# Patient Record
Sex: Female | Born: 1935
Health system: Southern US, Community
[De-identification: ages and names within clinical notes are randomized; demographics above are authoritative.]

## PROBLEM LIST (undated history)

## (undated) DIAGNOSIS — I10 Essential (primary) hypertension: Secondary | ICD-10-CM

## (undated) DIAGNOSIS — IMO0001 Reserved for inherently not codable concepts without codable children: Secondary | ICD-10-CM

## (undated) DIAGNOSIS — I639 Cerebral infarction, unspecified: Secondary | ICD-10-CM

## (undated) HISTORY — PX: CATARACT EXTRACTION: SUR2

## (undated) HISTORY — DX: Cerebral infarction, unspecified: I63.9

## (undated) HISTORY — PX: TUBAL LIGATION: SHX77

## (undated) HISTORY — PX: MOUTH SURGERY: SHX715

## (undated) HISTORY — PX: NASAL SEPTUM SURGERY: SHX37

## (undated) HISTORY — PX: LAPAROTOMY: SHX154

## (undated) HISTORY — PX: OOPHORECTOMY: SHX86

## (undated) HISTORY — PX: APPENDECTOMY: SHX54

## (undated) HISTORY — PX: HYSTEROTOMY: SHX1776

---

## 1997-06-25 ENCOUNTER — Emergency Department (HOSPITAL_COMMUNITY): Admission: EM | Admit: 1997-06-25 | Discharge: 1997-06-25 | Payer: Self-pay | Admitting: Emergency Medicine

## 1997-06-26 ENCOUNTER — Inpatient Hospital Stay (HOSPITAL_COMMUNITY): Admission: EM | Admit: 1997-06-26 | Discharge: 1997-06-29 | Payer: Self-pay | Admitting: Orthopedic Surgery

## 1999-07-14 ENCOUNTER — Other Ambulatory Visit: Admission: RE | Admit: 1999-07-14 | Discharge: 1999-07-14 | Payer: Self-pay | Admitting: Obstetrics and Gynecology

## 1999-08-16 ENCOUNTER — Encounter: Admission: RE | Admit: 1999-08-16 | Discharge: 1999-08-16 | Payer: Self-pay | Admitting: Obstetrics and Gynecology

## 1999-08-16 ENCOUNTER — Encounter: Payer: Self-pay | Admitting: Obstetrics and Gynecology

## 2000-07-25 ENCOUNTER — Other Ambulatory Visit: Admission: RE | Admit: 2000-07-25 | Discharge: 2000-07-25 | Payer: Self-pay | Admitting: Obstetrics and Gynecology

## 2000-09-18 ENCOUNTER — Encounter: Admission: RE | Admit: 2000-09-18 | Discharge: 2000-09-18 | Payer: Self-pay | Admitting: Obstetrics and Gynecology

## 2000-09-18 ENCOUNTER — Encounter: Payer: Self-pay | Admitting: Obstetrics and Gynecology

## 2000-12-10 ENCOUNTER — Ambulatory Visit (HOSPITAL_BASED_OUTPATIENT_CLINIC_OR_DEPARTMENT_OTHER): Admission: RE | Admit: 2000-12-10 | Discharge: 2000-12-10 | Payer: Self-pay | Admitting: Podiatry

## 2001-10-28 ENCOUNTER — Encounter: Admission: RE | Admit: 2001-10-28 | Discharge: 2001-10-28 | Payer: Self-pay | Admitting: Obstetrics and Gynecology

## 2001-10-28 ENCOUNTER — Encounter: Payer: Self-pay | Admitting: Obstetrics and Gynecology

## 2003-05-15 ENCOUNTER — Encounter: Admission: RE | Admit: 2003-05-15 | Discharge: 2003-05-15 | Payer: Self-pay | Admitting: Obstetrics and Gynecology

## 2004-06-14 ENCOUNTER — Encounter: Admission: RE | Admit: 2004-06-14 | Discharge: 2004-06-14 | Payer: Self-pay | Admitting: Obstetrics and Gynecology

## 2005-06-20 ENCOUNTER — Encounter: Admission: RE | Admit: 2005-06-20 | Discharge: 2005-06-20 | Payer: Self-pay | Admitting: Obstetrics and Gynecology

## 2006-08-03 ENCOUNTER — Encounter: Admission: RE | Admit: 2006-08-03 | Discharge: 2006-08-03 | Payer: Self-pay | Admitting: Obstetrics and Gynecology

## 2007-08-07 ENCOUNTER — Encounter: Admission: RE | Admit: 2007-08-07 | Discharge: 2007-08-07 | Payer: Self-pay | Admitting: Obstetrics and Gynecology

## 2008-03-13 DIAGNOSIS — M549 Dorsalgia, unspecified: Secondary | ICD-10-CM | POA: Insufficient documentation

## 2008-11-12 ENCOUNTER — Encounter: Admission: RE | Admit: 2008-11-12 | Discharge: 2008-11-12 | Payer: Self-pay | Admitting: Obstetrics and Gynecology

## 2009-11-17 ENCOUNTER — Encounter: Admission: RE | Admit: 2009-11-17 | Discharge: 2009-11-17 | Payer: Self-pay | Admitting: Obstetrics and Gynecology

## 2010-04-02 ENCOUNTER — Encounter: Payer: Self-pay | Admitting: Obstetrics and Gynecology

## 2010-07-29 NOTE — Op Note (Signed)
[place]. [facility]  Patient:    Kathy Calhoun, Kathy Calhoun Visit Number: [id number] MRN: [id number]          Service Type: Attending:  Cordella Register, D.P.M. Dictated by:   Cordella Register, D.P.M. Proc. Date: 12/10/00                             Operative Report  PREOPERATIVE DIAGNOSIS:  Plantar fasciitis of the left heel.  POSTOPERATIVE DIAGNOSIS:  Plantar fasciitis of the left heel.  PROCEDURE:  Endoscopic release, medial fascial band left.  ANESTHESIA:  IV sedation.  HEMOSTASIS:  Ankle tourniquet left.  INDICATIONS FOR SURGERY:  Chronic discomfort, inability to respond to injection therapy, orthotic therapy, anti-inflammatory therapy, mobilization therapy.  FINDINGS AND PROCEDURE:  The patient was brought to the OR and placed in the supine position.  The patient was injected with a total of 3 cc of Xylocaine in the PT and 12 cc of Xylocaine-Marcaine in an infiltration block.  The patients left foot was prepped and draped utilizing standard aseptic technique, and the left foot was exsanguinated utilizing an Esmarch.  The left ankle tourniquet was inflated, and the following procedure was performed:  Endoscopic release, medial fascial band left.  Attention was directed to the medial aspect of the left heel, where a small 1 cm linear incision was made on the medial side of the heel.  The incision was deepened through subcutaneous tissue.  The plantar fascial tendon was noted.  An elevator was then introduced plantar to this so as to create an opening, and a cannula was introduced from a medial to a lateral direction.  A small incision was made in the lateral side to allow for an exit hole.  A 30 degree scope was introduced into the wound and the plantar fascia was visualized from a dorsal direction. A triangular blade was introduced from the lateral side and an approximate one-third of the plantar fascia was excised with visualization of the underlying  muscle being identified.  This was found to be an adequate resection and should give her good relief.  I placed the scope into the other side, did not see any further pathology.  The wound was flushed with copious amounts of sterile Garamycin solution and was removed.  The cannula was removed.  The skin was sutured utilizing 4-0 nylon in simple interrupted fashion.  The surgical sites were infiltrated with 1 cc of dexamethasone, 5 cc of Marcaine.  The patient was taken to the recovery room in satisfactory condition discharged by the department of anesthesia with instructions and medications. Dictated by:   Cordella Register, D.P.M. Attending:  Cordella Register, D.P.M. DD:  12/12/00 TD:  12/12/00 Job: [phone] [id number]

## 2010-10-17 ENCOUNTER — Other Ambulatory Visit: Payer: Self-pay | Admitting: Obstetrics and Gynecology

## 2010-10-17 DIAGNOSIS — Z1231 Encounter for screening mammogram for malignant neoplasm of breast: Secondary | ICD-10-CM

## 2010-11-22 ENCOUNTER — Ambulatory Visit
Admission: RE | Admit: 2010-11-22 | Discharge: 2010-11-22 | Disposition: A | Discharge status: Home / Self Care | Payer: Medicare Other | Source: Ambulatory Visit | Attending: Obstetrics and Gynecology | Admitting: Obstetrics and Gynecology

## 2010-11-22 ENCOUNTER — Ambulatory Visit: Payer: Self-pay

## 2010-11-22 DIAGNOSIS — Z1231 Encounter for screening mammogram for malignant neoplasm of breast: Secondary | ICD-10-CM

## 2011-03-21 DIAGNOSIS — M76899 Other specified enthesopathies of unspecified lower limb, excluding foot: Secondary | ICD-10-CM | POA: Diagnosis not present

## 2011-03-21 DIAGNOSIS — M48061 Spinal stenosis, lumbar region without neurogenic claudication: Secondary | ICD-10-CM | POA: Diagnosis not present

## 2011-04-05 DIAGNOSIS — M810 Age-related osteoporosis without current pathological fracture: Secondary | ICD-10-CM | POA: Diagnosis not present

## 2011-05-08 DIAGNOSIS — M76899 Other specified enthesopathies of unspecified lower limb, excluding foot: Secondary | ICD-10-CM | POA: Diagnosis not present

## 2011-05-08 DIAGNOSIS — M949 Disorder of cartilage, unspecified: Secondary | ICD-10-CM | POA: Diagnosis not present

## 2011-05-08 DIAGNOSIS — IMO0002 Reserved for concepts with insufficient information to code with codable children: Secondary | ICD-10-CM | POA: Diagnosis not present

## 2011-05-08 DIAGNOSIS — M899 Disorder of bone, unspecified: Secondary | ICD-10-CM | POA: Diagnosis not present

## 2011-05-08 DIAGNOSIS — M545 Low back pain: Secondary | ICD-10-CM | POA: Diagnosis not present

## 2011-06-02 DIAGNOSIS — M48061 Spinal stenosis, lumbar region without neurogenic claudication: Secondary | ICD-10-CM | POA: Diagnosis not present

## 2011-06-02 DIAGNOSIS — M810 Age-related osteoporosis without current pathological fracture: Secondary | ICD-10-CM | POA: Diagnosis not present

## 2011-06-02 DIAGNOSIS — M76899 Other specified enthesopathies of unspecified lower limb, excluding foot: Secondary | ICD-10-CM | POA: Diagnosis not present

## 2011-06-23 DIAGNOSIS — Z0000 Encounter for general adult medical examination without abnormal findings: Secondary | ICD-10-CM | POA: Diagnosis not present

## 2011-06-23 DIAGNOSIS — Z1331 Encounter for screening for depression: Secondary | ICD-10-CM | POA: Diagnosis not present

## 2011-06-23 DIAGNOSIS — I10 Essential (primary) hypertension: Secondary | ICD-10-CM | POA: Diagnosis not present

## 2011-06-23 DIAGNOSIS — Z79899 Other long term (current) drug therapy: Secondary | ICD-10-CM | POA: Diagnosis not present

## 2011-06-26 DIAGNOSIS — I10 Essential (primary) hypertension: Secondary | ICD-10-CM | POA: Diagnosis not present

## 2011-07-13 DIAGNOSIS — N760 Acute vaginitis: Secondary | ICD-10-CM | POA: Diagnosis not present

## 2011-07-13 DIAGNOSIS — N9489 Other specified conditions associated with female genital organs and menstrual cycle: Secondary | ICD-10-CM | POA: Diagnosis not present

## 2011-07-18 ENCOUNTER — Encounter (HOSPITAL_COMMUNITY)
Admission: RE | Admit: 2011-07-18 | Discharge: 2011-07-18 | Disposition: A | Discharge status: Home / Self Care | Payer: Medicare Other | Source: Ambulatory Visit | Attending: Orthopedic Surgery | Admitting: Orthopedic Surgery

## 2011-07-18 ENCOUNTER — Encounter (HOSPITAL_COMMUNITY): Payer: Self-pay

## 2011-07-18 DIAGNOSIS — M810 Age-related osteoporosis without current pathological fracture: Secondary | ICD-10-CM | POA: Insufficient documentation

## 2011-07-18 HISTORY — DX: Reserved for inherently not codable concepts without codable children: IMO0001

## 2011-07-18 HISTORY — DX: Essential (primary) hypertension: I10

## 2011-07-18 MED ORDER — ZOLEDRONIC ACID 5 MG/100ML IV SOLN
5.0000 mg | Freq: Once | INTRAVENOUS | Status: AC
  Administered 2011-07-18: 5 mg via INTRAVENOUS
  Filled 2011-07-18: qty 100

## 2011-07-18 MED ORDER — SODIUM CHLORIDE 0.9 % IV SOLN
Freq: Once | INTRAVENOUS | Status: AC
  Administered 2011-07-18: 15:00:00 via INTRAVENOUS

## 2011-07-18 NOTE — Discharge Instructions (Signed)
RECLAST   ZOLEDRONIC ACID (ZOE le dron ik AS id) lowers the amount of calcium loss from bone. It is used to treat Paget's disease and osteoporosis in women. This medicine may be used for other purposes; ask your health care provider or pharmacist if you have questions. What should I tell my health care provider before I take this medicine? They need to know if you have any of these conditions: -aspirin-sensitive asthma -dental disease -kidney disease -low levels of calcium in the blood -past surgery on the parathyroid gland or intestines -an unusual or allergic reaction to zoledronic acid, other medicines, foods, dyes, or preservatives -pregnant or trying to get pregnant -breast-feeding How should I use this medicine? This medicine is for infusion into a vein. It is given by a health care professional in a hospital or clinic setting. Talk to your pediatrician regarding the use of this medicine in children. This medicine is not approved for use in children. Overdosage: If you think you have taken too much of this medicine contact a poison control center or emergency room at once. NOTE: This medicine is only for you. Do not share this medicine with others. What if I miss a dose? It is important not to miss your dose. Call your doctor or health care professional if you are unable to keep an appointment. What may interact with this medicine? -certain antibiotics given by injection -NSAIDs, medicines for pain and inflammation, like ibuprofen or naproxen -some diuretics like bumetanide, furosemide -teriparatide This list may not describe all possible interactions. Give your health care provider a list of all the medicines, herbs, non-prescription drugs, or dietary supplements you use. Also tell them if you smoke, drink alcohol, or use illegal drugs. Some items may interact with your medicine. What should I watch for while using this medicine? Visit your doctor or health care professional for  regular checkups. It may be some time before you see the benefit from this medicine. Do not stop taking your medicine unless your doctor tells you to. Your doctor may order blood tests or other tests to see how you are doing. Women should inform their doctor if they wish to become pregnant or think they might be pregnant. There is a potential for serious side effects to an unborn child. Talk to your health care professional or pharmacist for more information. You should make sure that you get enough calcium and vitamin D while you are taking this medicine. Discuss the foods you eat and the vitamins you take with your health care professional. Some people who take this medicine have severe bone, joint, and/or muscle pain. This medicine may also increase your risk for a broken thigh bone. Tell your doctor right away if you have pain in your upper leg or groin. Tell your doctor if you have any pain that does not go away or that gets worse. What side effects may I notice from receiving this medicine? Side effects that you should report to your doctor or health care professional as soon as possible: -allergic reactions like skin rash, itching or hives, swelling of the face, lips, or tongue -breathing problems -changes in vision -feeling faint or lightheaded, falls -jaw burning, cramping, or pain -muscle cramps, stiffness, or weakness -trouble passing urine or change in the amount of urine Side effects that usually do not require medical attention (report to your doctor or health care professional if they continue or are bothersome): -bone, joint, or muscle pain -fever -irritation at site where injected -loss of  appetite -nausea, vomiting -stomach upset -tired This list may not describe all possible side effects. Call your doctor for medical advice about side effects. You may report side effects to FDA at 1-800-FDA-1088. Where should I keep my medicine? This drug is given in a hospital or clinic and  will not be stored at home. NOTE: This sheet is a summary. It may not cover all possible information. If you have questions about this medicine, talk to your doctor, pharmacist, or health care provider.  2012, [employer] Standard. (08/26/2010 9:08:15 AM)

## 2011-09-26 DIAGNOSIS — M48061 Spinal stenosis, lumbar region without neurogenic claudication: Secondary | ICD-10-CM | POA: Diagnosis not present

## 2011-09-26 DIAGNOSIS — M949 Disorder of cartilage, unspecified: Secondary | ICD-10-CM | POA: Diagnosis not present

## 2011-09-26 DIAGNOSIS — M899 Disorder of bone, unspecified: Secondary | ICD-10-CM | POA: Diagnosis not present

## 2011-09-26 DIAGNOSIS — M76899 Other specified enthesopathies of unspecified lower limb, excluding foot: Secondary | ICD-10-CM | POA: Diagnosis not present

## 2011-10-12 DIAGNOSIS — H04129 Dry eye syndrome of unspecified lacrimal gland: Secondary | ICD-10-CM | POA: Diagnosis not present

## 2011-10-12 DIAGNOSIS — H52209 Unspecified astigmatism, unspecified eye: Secondary | ICD-10-CM | POA: Diagnosis not present

## 2011-10-12 DIAGNOSIS — H2640 Unspecified secondary cataract: Secondary | ICD-10-CM | POA: Diagnosis not present

## 2011-10-12 DIAGNOSIS — H3500 Unspecified background retinopathy: Secondary | ICD-10-CM | POA: Diagnosis not present

## 2011-10-17 DIAGNOSIS — M5030 Other cervical disc degeneration, unspecified cervical region: Secondary | ICD-10-CM | POA: Diagnosis not present

## 2011-10-17 DIAGNOSIS — M719 Bursopathy, unspecified: Secondary | ICD-10-CM | POA: Diagnosis not present

## 2011-10-17 DIAGNOSIS — M67919 Unspecified disorder of synovium and tendon, unspecified shoulder: Secondary | ICD-10-CM | POA: Diagnosis not present

## 2011-10-26 DIAGNOSIS — M5030 Other cervical disc degeneration, unspecified cervical region: Secondary | ICD-10-CM | POA: Diagnosis not present

## 2011-10-26 DIAGNOSIS — M19019 Primary osteoarthritis, unspecified shoulder: Secondary | ICD-10-CM | POA: Diagnosis not present

## 2011-10-31 DIAGNOSIS — M5412 Radiculopathy, cervical region: Secondary | ICD-10-CM | POA: Diagnosis not present

## 2011-10-31 DIAGNOSIS — M542 Cervicalgia: Secondary | ICD-10-CM | POA: Diagnosis not present

## 2011-11-01 DIAGNOSIS — IMO0002 Reserved for concepts with insufficient information to code with codable children: Secondary | ICD-10-CM | POA: Diagnosis not present

## 2011-11-01 DIAGNOSIS — M5412 Radiculopathy, cervical region: Secondary | ICD-10-CM | POA: Diagnosis not present

## 2011-11-14 ENCOUNTER — Other Ambulatory Visit: Payer: Self-pay | Admitting: Obstetrics and Gynecology

## 2011-11-14 DIAGNOSIS — M47812 Spondylosis without myelopathy or radiculopathy, cervical region: Secondary | ICD-10-CM | POA: Diagnosis not present

## 2011-11-14 DIAGNOSIS — Z1231 Encounter for screening mammogram for malignant neoplasm of breast: Secondary | ICD-10-CM

## 2011-11-28 ENCOUNTER — Ambulatory Visit
Admission: RE | Admit: 2011-11-28 | Discharge: 2011-11-28 | Disposition: A | Discharge status: Home / Self Care | Payer: Medicare Other | Source: Ambulatory Visit | Attending: Obstetrics and Gynecology | Admitting: Obstetrics and Gynecology

## 2011-11-28 DIAGNOSIS — Z1231 Encounter for screening mammogram for malignant neoplasm of breast: Secondary | ICD-10-CM | POA: Diagnosis not present

## 2012-01-16 DIAGNOSIS — Z23 Encounter for immunization: Secondary | ICD-10-CM | POA: Diagnosis not present

## 2012-01-30 DIAGNOSIS — H26499 Other secondary cataract, unspecified eye: Secondary | ICD-10-CM | POA: Diagnosis not present

## 2012-01-30 DIAGNOSIS — H2640 Unspecified secondary cataract: Secondary | ICD-10-CM | POA: Diagnosis not present

## 2012-02-14 DIAGNOSIS — Z01419 Encounter for gynecological examination (general) (routine) without abnormal findings: Secondary | ICD-10-CM | POA: Diagnosis not present

## 2012-02-14 DIAGNOSIS — Z0000 Encounter for general adult medical examination without abnormal findings: Secondary | ICD-10-CM | POA: Diagnosis not present

## 2012-03-22 DIAGNOSIS — Z639 Problem related to primary support group, unspecified: Secondary | ICD-10-CM | POA: Diagnosis not present

## 2012-03-22 DIAGNOSIS — Z79899 Other long term (current) drug therapy: Secondary | ICD-10-CM | POA: Diagnosis not present

## 2012-03-22 DIAGNOSIS — I10 Essential (primary) hypertension: Secondary | ICD-10-CM | POA: Diagnosis not present

## 2012-09-11 DIAGNOSIS — M76899 Other specified enthesopathies of unspecified lower limb, excluding foot: Secondary | ICD-10-CM | POA: Diagnosis not present

## 2012-09-11 DIAGNOSIS — M542 Cervicalgia: Secondary | ICD-10-CM | POA: Diagnosis not present

## 2012-09-11 DIAGNOSIS — M48061 Spinal stenosis, lumbar region without neurogenic claudication: Secondary | ICD-10-CM | POA: Diagnosis not present

## 2012-09-11 DIAGNOSIS — M5412 Radiculopathy, cervical region: Secondary | ICD-10-CM | POA: Diagnosis not present

## 2012-09-17 DIAGNOSIS — I10 Essential (primary) hypertension: Secondary | ICD-10-CM | POA: Diagnosis not present

## 2012-09-17 DIAGNOSIS — Z1331 Encounter for screening for depression: Secondary | ICD-10-CM | POA: Diagnosis not present

## 2012-09-17 DIAGNOSIS — Z0000 Encounter for general adult medical examination without abnormal findings: Secondary | ICD-10-CM | POA: Diagnosis not present

## 2012-09-17 DIAGNOSIS — Z79899 Other long term (current) drug therapy: Secondary | ICD-10-CM | POA: Diagnosis not present

## 2012-09-18 DIAGNOSIS — I10 Essential (primary) hypertension: Secondary | ICD-10-CM | POA: Diagnosis not present

## 2012-10-02 DIAGNOSIS — M4712 Other spondylosis with myelopathy, cervical region: Secondary | ICD-10-CM | POA: Diagnosis not present

## 2012-10-02 DIAGNOSIS — IMO0002 Reserved for concepts with insufficient information to code with codable children: Secondary | ICD-10-CM | POA: Diagnosis not present

## 2012-10-02 DIAGNOSIS — M542 Cervicalgia: Secondary | ICD-10-CM | POA: Diagnosis not present

## 2012-10-02 DIAGNOSIS — R079 Chest pain, unspecified: Secondary | ICD-10-CM | POA: Diagnosis not present

## 2012-10-16 DIAGNOSIS — M542 Cervicalgia: Secondary | ICD-10-CM | POA: Diagnosis not present

## 2012-10-16 DIAGNOSIS — R079 Chest pain, unspecified: Secondary | ICD-10-CM | POA: Diagnosis not present

## 2012-10-16 DIAGNOSIS — M545 Low back pain: Secondary | ICD-10-CM | POA: Diagnosis not present

## 2012-10-21 DIAGNOSIS — M949 Disorder of cartilage, unspecified: Secondary | ICD-10-CM | POA: Diagnosis not present

## 2012-10-29 ENCOUNTER — Other Ambulatory Visit: Payer: Self-pay

## 2012-10-29 DIAGNOSIS — Z1231 Encounter for screening mammogram for malignant neoplasm of breast: Secondary | ICD-10-CM

## 2012-11-08 DIAGNOSIS — IMO0002 Reserved for concepts with insufficient information to code with codable children: Secondary | ICD-10-CM | POA: Diagnosis not present

## 2012-11-26 DIAGNOSIS — IMO0002 Reserved for concepts with insufficient information to code with codable children: Secondary | ICD-10-CM | POA: Diagnosis not present

## 2012-11-28 ENCOUNTER — Ambulatory Visit
Admission: RE | Admit: 2012-11-28 | Discharge: 2012-11-28 | Disposition: A | Discharge status: Home / Self Care | Payer: Medicare Other | Source: Ambulatory Visit

## 2012-11-28 DIAGNOSIS — Z1231 Encounter for screening mammogram for malignant neoplasm of breast: Secondary | ICD-10-CM | POA: Diagnosis not present

## 2012-12-19 DIAGNOSIS — IMO0002 Reserved for concepts with insufficient information to code with codable children: Secondary | ICD-10-CM | POA: Diagnosis not present

## 2013-01-09 DIAGNOSIS — IMO0002 Reserved for concepts with insufficient information to code with codable children: Secondary | ICD-10-CM | POA: Diagnosis not present

## 2013-01-13 DIAGNOSIS — I10 Essential (primary) hypertension: Secondary | ICD-10-CM | POA: Diagnosis not present

## 2013-01-13 DIAGNOSIS — Z23 Encounter for immunization: Secondary | ICD-10-CM | POA: Diagnosis not present

## 2013-01-13 DIAGNOSIS — R42 Dizziness and giddiness: Secondary | ICD-10-CM | POA: Diagnosis not present

## 2013-02-18 DIAGNOSIS — Z0000 Encounter for general adult medical examination without abnormal findings: Secondary | ICD-10-CM | POA: Diagnosis not present

## 2013-02-18 DIAGNOSIS — Z01419 Encounter for gynecological examination (general) (routine) without abnormal findings: Secondary | ICD-10-CM | POA: Diagnosis not present

## 2013-04-16 DIAGNOSIS — Z961 Presence of intraocular lens: Secondary | ICD-10-CM | POA: Diagnosis not present

## 2013-04-16 DIAGNOSIS — H18599 Other hereditary corneal dystrophies, unspecified eye: Secondary | ICD-10-CM | POA: Diagnosis not present

## 2013-05-12 ENCOUNTER — Encounter (HOSPITAL_BASED_OUTPATIENT_CLINIC_OR_DEPARTMENT_OTHER): Payer: Self-pay | Admitting: Emergency Medicine

## 2013-05-12 ENCOUNTER — Emergency Department (HOSPITAL_BASED_OUTPATIENT_CLINIC_OR_DEPARTMENT_OTHER): Payer: Medicare Other

## 2013-05-12 ENCOUNTER — Inpatient Hospital Stay (HOSPITAL_COMMUNITY): Payer: Medicare Other

## 2013-05-12 ENCOUNTER — Inpatient Hospital Stay (HOSPITAL_BASED_OUTPATIENT_CLINIC_OR_DEPARTMENT_OTHER)
Admission: EM | Admit: 2013-05-12 | Discharge: 2013-05-14 | DRG: 065 | Disposition: A | Discharge status: Rehabilitation Facility | Payer: Medicare Other | Attending: Internal Medicine | Admitting: Internal Medicine

## 2013-05-12 DIAGNOSIS — E785 Hyperlipidemia, unspecified: Secondary | ICD-10-CM | POA: Diagnosis present

## 2013-05-12 DIAGNOSIS — G8929 Other chronic pain: Secondary | ICD-10-CM | POA: Diagnosis present

## 2013-05-12 DIAGNOSIS — R29810 Facial weakness: Secondary | ICD-10-CM | POA: Diagnosis present

## 2013-05-12 DIAGNOSIS — R82998 Other abnormal findings in urine: Secondary | ICD-10-CM | POA: Diagnosis present

## 2013-05-12 DIAGNOSIS — G8110 Spastic hemiplegia affecting unspecified side: Secondary | ICD-10-CM | POA: Diagnosis not present

## 2013-05-12 DIAGNOSIS — F3289 Other specified depressive episodes: Secondary | ICD-10-CM | POA: Diagnosis not present

## 2013-05-12 DIAGNOSIS — I6350 Cerebral infarction due to unspecified occlusion or stenosis of unspecified cerebral artery: Secondary | ICD-10-CM | POA: Diagnosis not present

## 2013-05-12 DIAGNOSIS — Z9851 Tubal ligation status: Secondary | ICD-10-CM

## 2013-05-12 DIAGNOSIS — M25519 Pain in unspecified shoulder: Secondary | ICD-10-CM | POA: Diagnosis present

## 2013-05-12 DIAGNOSIS — M545 Low back pain, unspecified: Secondary | ICD-10-CM | POA: Diagnosis present

## 2013-05-12 DIAGNOSIS — I6330 Cerebral infarction due to thrombosis of unspecified cerebral artery: Secondary | ICD-10-CM | POA: Diagnosis not present

## 2013-05-12 DIAGNOSIS — Z9089 Acquired absence of other organs: Secondary | ICD-10-CM

## 2013-05-12 DIAGNOSIS — Z91199 Patient's noncompliance with other medical treatment and regimen due to unspecified reason: Secondary | ICD-10-CM | POA: Diagnosis not present

## 2013-05-12 DIAGNOSIS — Z79899 Other long term (current) drug therapy: Secondary | ICD-10-CM | POA: Diagnosis not present

## 2013-05-12 DIAGNOSIS — M810 Age-related osteoporosis without current pathological fracture: Secondary | ICD-10-CM | POA: Diagnosis present

## 2013-05-12 DIAGNOSIS — G8190 Hemiplegia, unspecified affecting unspecified side: Secondary | ICD-10-CM | POA: Diagnosis present

## 2013-05-12 DIAGNOSIS — J449 Chronic obstructive pulmonary disease, unspecified: Secondary | ICD-10-CM | POA: Diagnosis not present

## 2013-05-12 DIAGNOSIS — I519 Heart disease, unspecified: Secondary | ICD-10-CM | POA: Diagnosis not present

## 2013-05-12 DIAGNOSIS — I10 Essential (primary) hypertension: Secondary | ICD-10-CM | POA: Diagnosis not present

## 2013-05-12 DIAGNOSIS — R5381 Other malaise: Secondary | ICD-10-CM | POA: Diagnosis not present

## 2013-05-12 DIAGNOSIS — Z9849 Cataract extraction status, unspecified eye: Secondary | ICD-10-CM

## 2013-05-12 DIAGNOSIS — Z888 Allergy status to other drugs, medicaments and biological substances status: Secondary | ICD-10-CM | POA: Diagnosis not present

## 2013-05-12 DIAGNOSIS — Z7982 Long term (current) use of aspirin: Secondary | ICD-10-CM | POA: Diagnosis not present

## 2013-05-12 DIAGNOSIS — Z9119 Patient's noncompliance with other medical treatment and regimen: Secondary | ICD-10-CM | POA: Diagnosis not present

## 2013-05-12 DIAGNOSIS — I639 Cerebral infarction, unspecified: Secondary | ICD-10-CM | POA: Diagnosis present

## 2013-05-12 DIAGNOSIS — Z882 Allergy status to sulfonamides status: Secondary | ICD-10-CM

## 2013-05-12 DIAGNOSIS — R5383 Other fatigue: Secondary | ICD-10-CM | POA: Diagnosis not present

## 2013-05-12 DIAGNOSIS — F329 Major depressive disorder, single episode, unspecified: Secondary | ICD-10-CM | POA: Diagnosis present

## 2013-05-12 DIAGNOSIS — Z5189 Encounter for other specified aftercare: Secondary | ICD-10-CM | POA: Diagnosis not present

## 2013-05-12 DIAGNOSIS — R29898 Other symptoms and signs involving the musculoskeletal system: Secondary | ICD-10-CM | POA: Diagnosis not present

## 2013-05-12 DIAGNOSIS — F32A Depression, unspecified: Secondary | ICD-10-CM | POA: Diagnosis present

## 2013-05-12 LAB — PROTIME-INR
INR: 0.96 (ref 0.00–1.49)
Prothrombin Time: 12.6 seconds (ref 11.6–15.2)

## 2013-05-12 LAB — DIFFERENTIAL
BASOS ABS: 0.1 10*3/uL (ref 0.0–0.1)
Basophils Relative: 1 % (ref 0–1)
EOS ABS: 0.1 10*3/uL (ref 0.0–0.7)
EOS PCT: 1 % (ref 0–5)
LYMPHS ABS: 2.5 10*3/uL (ref 0.7–4.0)
Lymphocytes Relative: 40 % (ref 12–46)
MONO ABS: 0.7 10*3/uL (ref 0.1–1.0)
Monocytes Relative: 11 % (ref 3–12)
Neutro Abs: 3 10*3/uL (ref 1.7–7.7)
Neutrophils Relative %: 47 % (ref 43–77)

## 2013-05-12 LAB — CBC
HCT: 42.6 % (ref 36.0–46.0)
Hemoglobin: 14.2 g/dL (ref 12.0–15.0)
MCH: 31.2 pg (ref 26.0–34.0)
MCHC: 33.3 g/dL (ref 30.0–36.0)
MCV: 93.6 fL (ref 78.0–100.0)
Platelets: 290 10*3/uL (ref 150–400)
RBC: 4.55 MIL/uL (ref 3.87–5.11)
RDW: 13.9 % (ref 11.5–15.5)
WBC: 6.4 10*3/uL (ref 4.0–10.5)

## 2013-05-12 LAB — URINALYSIS, ROUTINE W REFLEX MICROSCOPIC
Bilirubin Urine: NEGATIVE
Glucose, UA: NEGATIVE mg/dL
Ketones, ur: NEGATIVE mg/dL
Nitrite: NEGATIVE
PROTEIN: NEGATIVE mg/dL
Specific Gravity, Urine: 1.014 (ref 1.005–1.030)
Urobilinogen, UA: 1 mg/dL (ref 0.0–1.0)
pH: 7 (ref 5.0–8.0)

## 2013-05-12 LAB — COMPREHENSIVE METABOLIC PANEL
ALT: 15 U/L (ref 0–35)
AST: 20 U/L (ref 0–37)
Albumin: 4 g/dL (ref 3.5–5.2)
Alkaline Phosphatase: 84 U/L (ref 39–117)
BUN: 19 mg/dL (ref 6–23)
CALCIUM: 10.2 mg/dL (ref 8.4–10.5)
CO2: 26 mEq/L (ref 19–32)
CREATININE: 0.8 mg/dL (ref 0.50–1.10)
Chloride: 102 mEq/L (ref 96–112)
GFR calc Af Amer: 80 mL/min — ABNORMAL LOW (ref >90)
GFR calc non Af Amer: 69 mL/min — ABNORMAL LOW (ref >90)
GLUCOSE: 115 mg/dL — AB (ref 70–99)
Potassium: 3.8 mEq/L (ref 3.7–5.3)
SODIUM: 143 meq/L (ref 137–147)
TOTAL PROTEIN: 7.8 g/dL (ref 6.0–8.3)
Total Bilirubin: 0.4 mg/dL (ref 0.3–1.2)

## 2013-05-12 LAB — RAPID URINE DRUG SCREEN, HOSP PERFORMED
AMPHETAMINES: NOT DETECTED
BENZODIAZEPINES: NOT DETECTED
Barbiturates: NOT DETECTED
Cocaine: NOT DETECTED
Opiates: NOT DETECTED
Tetrahydrocannabinol: NOT DETECTED

## 2013-05-12 LAB — CREATININE, SERUM
Creatinine, Ser: 0.89 mg/dL (ref 0.50–1.10)
GFR calc Af Amer: 71 mL/min — ABNORMAL LOW (ref >90)
GFR calc non Af Amer: 61 mL/min — ABNORMAL LOW (ref >90)

## 2013-05-12 LAB — CBG MONITORING, ED: Glucose-Capillary: 115 mg/dL — ABNORMAL HIGH (ref 70–99)

## 2013-05-12 LAB — URINE MICROSCOPIC-ADD ON

## 2013-05-12 LAB — APTT: aPTT: 27 seconds (ref 24–37)

## 2013-05-12 LAB — ETHANOL

## 2013-05-12 LAB — TROPONIN I: Troponin I: <0.3 ng/mL (ref <0.30)

## 2013-05-12 MED ORDER — ASPIRIN 325 MG PO TABS
325.0000 mg | ORAL_TABLET | Freq: Every day | ORAL | Status: DC
  Administered 2013-05-12 – 2013-05-14 (×3): 325 mg via ORAL
  Filled 2013-05-12 (×3): qty 1

## 2013-05-12 MED ORDER — AMLODIPINE BESYLATE 2.5 MG PO TABS: 2.5000 mg | ORAL_TABLET | Freq: Every day | ORAL | Status: DC

## 2013-05-12 MED ORDER — SENNOSIDES-DOCUSATE SODIUM 8.6-50 MG PO TABS: 1.0000 | ORAL_TABLET | Freq: Every evening | ORAL | Status: DC | PRN

## 2013-05-12 MED ORDER — MULTI-VITAMIN/MINERALS PO TABS: 1.0000 | ORAL_TABLET | Freq: Every day | ORAL | Status: DC

## 2013-05-12 MED ORDER — DULOXETINE HCL 30 MG PO CPEP
30.0000 mg | ORAL_CAPSULE | Freq: Every day | ORAL | Status: DC
  Administered 2013-05-13 – 2013-05-14 (×2): 30 mg via ORAL
  Filled 2013-05-12 (×2): qty 1

## 2013-05-12 MED ORDER — ADULT MULTIVITAMIN W/MINERALS CH
1.0000 | ORAL_TABLET | Freq: Every day | ORAL | Status: DC
  Administered 2013-05-12 – 2013-05-14 (×3): 1 via ORAL
  Filled 2013-05-12 (×3): qty 1

## 2013-05-12 MED ORDER — ENOXAPARIN SODIUM 40 MG/0.4ML ~~LOC~~ SOLN
40.0000 mg | SUBCUTANEOUS | Status: DC
  Administered 2013-05-12 – 2013-05-13 (×2): 40 mg via SUBCUTANEOUS
  Filled 2013-05-12 (×3): qty 0.4

## 2013-05-12 MED ORDER — ASPIRIN 300 MG RE SUPP
300.0000 mg | Freq: Every day | RECTAL | Status: DC
  Filled 2013-05-12 (×2): qty 1

## 2013-05-12 MED ORDER — HYDROCHLOROTHIAZIDE 12.5 MG PO CAPS
12.5000 mg | ORAL_CAPSULE | Freq: Every day | ORAL | Status: DC
Start: 2013-05-12 — End: 2013-05-13
  Administered 2013-05-12 – 2013-05-13 (×2): 12.5 mg via ORAL
  Filled 2013-05-12 (×2): qty 1

## 2013-05-12 MED ORDER — ENOXAPARIN SODIUM 40 MG/0.4ML ~~LOC~~ SOLN: 40.0000 mg | SUBCUTANEOUS | Status: DC

## 2013-05-12 MED ORDER — VITAMIN D3 25 MCG (1000 UNIT) PO TABS
2000.0000 [IU] | ORAL_TABLET | Freq: Every day | ORAL | Status: DC
  Administered 2013-05-12 – 2013-05-14 (×3): 2000 [IU] via ORAL
  Filled 2013-05-12 (×3): qty 2

## 2013-05-12 NOTE — Progress Notes (Addendum)
This is a 80 year old female with a history of hypertension that presented to [facility]. She's been experiencing right leg and right arm weakness for approximately 3 days. Patient has had limited range of motion of the right arm do to rotator cuff issues. No slurred speech, no dizziness, no headaches. CT showed chronic ischemia however area of low attenuation in left parietal lobe.  Patient will likely need CVA workup including MRI upon arrival as well as neurology consult.  Current vitals are stable, however BP of 180/69.  EKG SR 75.  Accepted Team 10, neuro/tele.

## 2013-05-12 NOTE — H&P (Addendum)
[facility] Stroke Admission H&P  PCP: [facility]   Chief Complaint: CVA  HPI: Kathy Calhoun is an 80 y.o. female presenting with CVA. Per pt, she has had 4-5 days of R sided, but predominantly RUE hemiparesis. Pt has a baseline hx/o R shoulder pain s/p rotator cuff surgery several years ago. Pt states that she was trying to sew when she felt her arm fall asleep 4-5 days ago and this has persisted. Pt has also had some RLE weakness to the point that she is scared to walk. Pt denies any falls at home. Denies any prior hx/o stroke in the past. Non smoker. Non diabetic. Does report a prior hx/o HTN. Her BP medication was d/c'd by PCP last year secondary to falls. Pt does not remember name of medication. Pt not on ASA prior to admission.   Pt was brought to [facility] ER for further evaluation by son. Was noted to have questionable L parietal lobe CVA on head CT.     Past Medical History  Diagnosis Date  . Osteoporosis   . HTN (hypertension)   . Disc     bulging lumbar disc has had steriod injections  x3    Past Surgical History  Procedure Laterality Date  . Cataract extraction      bilaterally 2011  . Appendectomy    . Tubal ligation    . Hysterotomy    . Oophorectomy    . Laparotomy      large benign mass removed 1993  . Nasal septum surgery      No family history on file. Social History:  reports that she has never smoked. She does not have any smokeless tobacco history on file. She reports that she does not drink alcohol or use illicit drugs.  Allergies:  Allergies  Allergen Reactions  . Codeine Rash  . Sulfa Antibiotics Rash    Medications Prior to Admission  Medication Sig Dispense Refill  . amLODipine (NORVASC) 2.5 MG tablet Take 2.5 mg by mouth daily.      . Calcium-Vitamin D (CALTRATE 600 PLUS-VIT D PO) Take 1 tablet by mouth daily.       . cholecalciferol (VITAMIN D) 1000 UNITS tablet Take 2,000 Units by mouth daily.      . DULoxetine (CYMBALTA) 30 MG capsule Take  30 mg by mouth daily.      Marland Kitchen estrogens, conjugated, (PREMARIN) 0.9 MG tablet Take 0.9 mg by mouth daily. Take daily for 21 days then do not take for 7 days.      . hydrochlorothiazide (MICROZIDE) 12.5 MG capsule Take 12.5 mg by mouth daily.      . meloxicam (MOBIC) 15 MG tablet Take 15 mg by mouth daily.      . Multiple Vitamins-Minerals (MULTIVITAMIN WITH MINERALS) tablet Take 1 tablet by mouth daily.        ROS:12 point ROS negative except as noted above in HPI   Physical Examination: Blood pressure 179/68, pulse 76, temperature 97.9 F (36.6 C), temperature source Oral, resp. rate 18, height '4\' 11"'  (1.499 m), weight 54.432 kg (120 lb), SpO2 100.00%.  HEENT-  Normocephalic, no lesions, without obvious abnormality.  Normal external eye and conjunctiva.  Normal TM's bilaterally.  Normal auditory canals and external ears. Normal external nose, mucus membranes and septum.  Normal pharynx. Neck supple with no masses, nodes, nodules or enlargement. Cardiovascular - regular rate and rhythm, S1, S2 normal, no murmur, click, rub or gallop Lungs - chest clear, no wheezing, rales, normal  symmetric air entry, Heart exam - S1, S2 normal, no murmur, no gallop, rate regular Abdomen - soft, non-tender; bowel sounds normal; no masses,  no organomegaly Extremities - no joint deformities, effusion, or inflammation and no edema  Neurologic Examination: Mild R sided facial droop.  R sided hemiparesis, markedly decreased grip strength, marked RLE weakness Sensation mildly intact.    Results for orders placed during the hospital encounter of 05/12/13 (from the past 48 hour(s))  ETHANOL     Status: None   Collection Time    05/12/13  1:20 PM      Result Value Ref Range   Alcohol, Ethyl (B) <11  0 - 11 mg/dL   Comment:            LOWEST DETECTABLE LIMIT FOR     SERUM ALCOHOL IS 11 mg/dL     FOR MEDICAL PURPOSES ONLY  PROTIME-INR     Status: None   Collection Time    05/12/13  1:20 PM      Result  Value Ref Range   Prothrombin Time 12.6  11.6 - 15.2 seconds   INR 0.96  0.00 - 1.49  APTT     Status: None   Collection Time    05/12/13  1:20 PM      Result Value Ref Range   aPTT 27  24 - 37 seconds  CBC     Status: None   Collection Time    05/12/13  1:20 PM      Result Value Ref Range   WBC 6.4  4.0 - 10.5 K/uL   RBC 4.55  3.87 - 5.11 MIL/uL   Hemoglobin 14.2  12.0 - 15.0 g/dL   HCT 42.6  36.0 - 46.0 %   MCV 93.6  78.0 - 100.0 fL   MCH 31.2  26.0 - 34.0 pg   MCHC 33.3  30.0 - 36.0 g/dL   RDW 13.9  11.5 - 15.5 %   Platelets 290  150 - 400 K/uL  DIFFERENTIAL     Status: None   Collection Time    05/12/13  1:20 PM      Result Value Ref Range   Neutrophils Relative % 47  43 - 77 %   Neutro Abs 3.0  1.7 - 7.7 K/uL   Lymphocytes Relative 40  12 - 46 %   Lymphs Abs 2.5  0.7 - 4.0 K/uL   Monocytes Relative 11  3 - 12 %   Monocytes Absolute 0.7  0.1 - 1.0 K/uL   Eosinophils Relative 1  0 - 5 %   Eosinophils Absolute 0.1  0.0 - 0.7 K/uL   Basophils Relative 1  0 - 1 %   Basophils Absolute 0.1  0.0 - 0.1 K/uL  COMPREHENSIVE METABOLIC PANEL     Status: Abnormal   Collection Time    05/12/13  1:20 PM      Result Value Ref Range   Sodium 143  137 - 147 mEq/L   Potassium 3.8  3.7 - 5.3 mEq/L   Chloride 102  96 - 112 mEq/L   CO2 26  19 - 32 mEq/L   Glucose, Bld 115 (*) 70 - 99 mg/dL   BUN 19  6 - 23 mg/dL   Creatinine, Ser 0.80  0.50 - 1.10 mg/dL   Calcium 10.2  8.4 - 10.5 mg/dL   Total Protein 7.8  6.0 - 8.3 g/dL   Albumin 4.0  3.5 - 5.2 g/dL   AST  20  0 - 37 U/L   ALT 15  0 - 35 U/L   Alkaline Phosphatase 84  39 - 117 U/L   Total Bilirubin 0.4  0.3 - 1.2 mg/dL   GFR calc non Af Amer 69 (*) >90 mL/min   GFR calc Af Amer 80 (*) >90 mL/min   Comment: (NOTE)     The eGFR has been calculated using the CKD EPI equation.     This calculation has not been validated in all clinical situations.     eGFR's persistently <90 mL/min signify possible Chronic Kidney     Disease.   TROPONIN I     Status: None   Collection Time    05/12/13  1:20 PM      Result Value Ref Range   Troponin I <0.30  <0.30 ng/mL   Comment:            Due to the release kinetics of cTnI,     a negative result within the first hours     of the onset of symptoms does not rule out     myocardial infarction with certainty.     If myocardial infarction is still suspected,     repeat the test at appropriate intervals.  CBG MONITORING, ED     Status: Abnormal   Collection Time    05/12/13  2:09 PM      Result Value Ref Range   Glucose-Capillary 115 (*) 70 - 99 mg/dL   Comment 1 Notify RN     Comment 2 Documented in Chart    URINE RAPID DRUG SCREEN (HOSP PERFORMED)     Status: None   Collection Time    05/12/13  2:15 PM      Result Value Ref Range   Opiates NONE DETECTED  NONE DETECTED   Cocaine NONE DETECTED  NONE DETECTED   Benzodiazepines NONE DETECTED  NONE DETECTED   Amphetamines NONE DETECTED  NONE DETECTED   Tetrahydrocannabinol NONE DETECTED  NONE DETECTED   Barbiturates NONE DETECTED  NONE DETECTED   Comment:            DRUG SCREEN FOR MEDICAL PURPOSES     ONLY.  IF CONFIRMATION IS NEEDED     FOR ANY PURPOSE, NOTIFY LAB     WITHIN 5 DAYS.                LOWEST DETECTABLE LIMITS     FOR URINE DRUG SCREEN     Drug Class       Cutoff (ng/mL)     Amphetamine      1000     Barbiturate      200     Benzodiazepine   240     Tricyclics       973     Opiates          300     Cocaine          300     THC              50  URINALYSIS, ROUTINE W REFLEX MICROSCOPIC     Status: Abnormal   Collection Time    05/12/13  2:15 PM      Result Value Ref Range   Color, Urine YELLOW  YELLOW   APPearance CLEAR  CLEAR   Specific Gravity, Urine 1.014  1.005 - 1.030   pH 7.0  5.0 - 8.0   Glucose, UA NEGATIVE  NEGATIVE mg/dL  Hgb urine dipstick SMALL (*) NEGATIVE   Bilirubin Urine NEGATIVE  NEGATIVE   Ketones, ur NEGATIVE  NEGATIVE mg/dL   Protein, ur NEGATIVE  NEGATIVE mg/dL    Urobilinogen, UA 1.0  0.0 - 1.0 mg/dL   Nitrite NEGATIVE  NEGATIVE   Leukocytes, UA MODERATE (*) NEGATIVE  URINE MICROSCOPIC-ADD ON     Status: Abnormal   Collection Time    05/12/13  2:15 PM      Result Value Ref Range   Squamous Epithelial / LPF RARE  RARE   WBC, UA 7-10  <3 WBC/hpf   RBC / HPF 3-6  <3 RBC/hpf   Bacteria, UA FEW (*) RARE   Ct Head Wo Contrast  05/12/2013   CLINICAL DATA:  Right arm weakness since Friday  EXAM: CT HEAD WITHOUT CONTRAST  TECHNIQUE: Contiguous axial images were obtained from the base of the skull through the vertex without intravenous contrast.  COMPARISON:  None.  FINDINGS: Moderate diffuse atrophy. Severe bilateral low attenuation in the deep periventricular white matter. There is no vascular territory infarct. There is no hemorrhage or extra-axial fluid. There is no mass effect or hydrocephalus. In the left corona radiata above the level of the ventricles and the parietal lobe there is a 13 mm oval focus of low attenuation.  IMPRESSION: Significant chronic involutional change with evidence of chronic ischemia in the white matter.  More focal oval area of low-attenuation left parietal lobe deep white matter above the level of the ventricles. This could represent asymmetric ischemic change, but the possibility of subacute lacunar infarction is not excluded. Consider MRI if indicated to better evaluate.   Electronically Signed   By: Skipper Cliche M.D.   On: 05/12/2013 13:56   Assessment: 80 y.o. female with CVA   Stroke Risk Factors - hypertension  Stroke Plan: Neuro consult in am  1. HgbA1c, fasting lipid panel 2. MRI, MRA  of the brain without contrast 3. PT consult, OT consult, Speech consult 4. Echocardiogram 5. Carotid dopplers 6. Prophylactic therapy-Antiplatelet med: Aspirin - dose 325 7. Risk factor modification 7. Telemetry monitoring * hold premarin as this increases pt thrombotic risk.   HTN: Elevated BP. Restart low dose norvasc and HCTZ.  Watch BP closely.   FEN/GI: heart healthy diet pending bedside swallow eval.  Dispo: pending further evaluation Code Status: Full Code.    Shanda Howells 05/12/2013, 7:29 PM

## 2013-05-12 NOTE — ED Provider Notes (Signed)
CSN: [id number]     Arrival date & time 05/12/13  1205 History   First MD Initiated Contact with Patient 05/12/13 1221     Chief Complaint  Patient presents with  . Weakness     (Consider location/radiation/quality/duration/timing/severity/associated sxs/prior Treatment) HPI Comments: Patient presents with right arm numbness and weakness since Friday, 3 days ago. She also complains of intermittent weakness in her right leg. She denies any headache, chest pain or shortness of breath. She has limited range of motion in her right arm at baseline due to previous surgery but now is unable to lift her arm off the bed. She denies any dizziness or lightheadedness. No abdominal pain or back pain. She reports a history of hypertension. She denies any difficulty swallowing or speaking.  The history is provided by the patient.    Past Medical History  Diagnosis Date  . Osteoporosis   . HTN (hypertension)   . Disc     bulging lumbar disc has had steriod injections  x3   Past Surgical History  Procedure Laterality Date  . Cataract extraction      bilaterally 2011  . Appendectomy    . Tubal ligation    . Hysterotomy    . Oophorectomy    . Laparotomy      large benign mass removed 1993  . Nasal septum surgery     No family history on file. History  Substance Use Topics  . Smoking status: Never Smoker   . Smokeless tobacco: Not on file  . Alcohol Use: No   OB History   Grav Para Term Preterm Abortions TAB SAB Ect Mult Living                 Review of Systems  Constitutional: Negative for fever, activity change and appetite change.  HENT: Negative for congestion and rhinorrhea.   Respiratory: Negative for cough, chest tightness and shortness of breath.   Cardiovascular: Negative for chest pain.  Gastrointestinal: Negative for nausea, vomiting and abdominal pain.  Genitourinary: Negative for dysuria, hematuria, vaginal bleeding and vaginal discharge.  Musculoskeletal: Negative for  arthralgias, back pain and myalgias.  Skin: Negative for wound.  Neurological: Positive for weakness and numbness. Negative for light-headedness and headaches.  A complete 10 system review of systems was obtained and all systems are negative except as noted in the HPI and PMH.      Allergies  Codeine and Sulfa antibiotics  Home Medications   Current Outpatient Rx  Name  Route  Sig  Dispense  Refill  . amLODipine (NORVASC) 2.5 MG tablet   Oral   Take 2.5 mg by mouth daily.         . Calcium-Vitamin D (CALTRATE 600 PLUS-VIT D PO)   Oral   Take by mouth.         . cholecalciferol (VITAMIN D) 1000 UNITS tablet   Oral   Take 2,000 Units by mouth daily.         . DULoxetine (CYMBALTA) 30 MG capsule   Oral   Take 30 mg by mouth daily.         Marland Kitchen estrogens, conjugated, (PREMARIN) 0.9 MG tablet   Oral   Take 0.9 mg by mouth daily. Take daily for 21 days then do not take for 7 days.         . fluconazole (DIFLUCAN) 150 MG tablet   Oral   Take 150 mg by mouth once.         Marland Kitchen  hydrochlorothiazide (MICROZIDE) 12.5 MG capsule   Oral   Take 12.5 mg by mouth daily.         Marland Kitchen HYDROcodone-acetaminophen (NORCO) 5-325 MG per tablet   Oral   Take 1 tablet by mouth every 6 (six) hours as needed.         . meloxicam (MOBIC) 15 MG tablet   Oral   Take 15 mg by mouth daily.         . Multiple Vitamins-Minerals (MULTIVITAMIN WITH MINERALS) tablet   Oral   Take 1 tablet by mouth daily.          BP 154/65  Pulse 79  Temp(Src) 98.6 F (37 C) (Oral)  Resp 19  Ht 4\' 11"  (1.499 m)  Wt 120 lb (54.432 kg)  BMI 24.22 kg/m2  SpO2 99% Physical Exam  Constitutional: She appears well-developed and well-nourished. No distress.  HENT:  Head: Normocephalic and atraumatic.  Mouth/Throat: Oropharynx is clear and moist. No oropharyngeal exudate.  Eyes: Conjunctivae and EOM are normal. Pupils are equal, round, and reactive to light.  Neck: Normal range of motion. Neck  supple.  Cardiovascular: Normal rate, regular rhythm and normal heart sounds.   Pulmonary/Chest: Effort normal and breath sounds normal.  Abdominal: Soft. There is no tenderness. There is no rebound and no guarding.  Musculoskeletal: Normal range of motion. She exhibits no edema and no tenderness.  Neurological: She is alert. She exhibits abnormal muscle tone. Coordination abnormal.  3/5 RUE, unable to lift shoulder at baseline, weak push and pull and grip strength 3/5 RLE, 5/5 LUE and LLE CN 2-12 intact.   Skin: Skin is warm. She is not diaphoretic.    ED Course  Procedures (including critical care time) Labs Review Labs Reviewed  COMPREHENSIVE METABOLIC PANEL - Abnormal; Notable for the following:    Glucose, Bld 115 (*)    GFR calc non Af Amer 69 (*)    GFR calc Af Amer 80 (*)    All other components within normal limits  URINALYSIS, ROUTINE W REFLEX MICROSCOPIC - Abnormal; Notable for the following:    Hgb urine dipstick SMALL (*)    Leukocytes, UA MODERATE (*)    All other components within normal limits  URINE MICROSCOPIC-ADD ON - Abnormal; Notable for the following:    Bacteria, UA FEW (*)    All other components within normal limits  CBG MONITORING, ED - Abnormal; Notable for the following:    Glucose-Capillary 115 (*)    All other components within normal limits  ETHANOL  PROTIME-INR  APTT  CBC  DIFFERENTIAL  URINE RAPID DRUG SCREEN (HOSP PERFORMED)  TROPONIN I  CBG MONITORING, ED   Imaging Review Ct Head Wo Contrast  05/12/2013   CLINICAL DATA:  Right arm weakness since Friday  EXAM: CT HEAD WITHOUT CONTRAST  TECHNIQUE: Contiguous axial images were obtained from the base of the skull through the vertex without intravenous contrast.  COMPARISON:  None.  FINDINGS: Moderate diffuse atrophy. Severe bilateral low attenuation in the deep periventricular white matter. There is no vascular territory infarct. There is no hemorrhage or extra-axial fluid. There is no mass  effect or hydrocephalus. In the left corona radiata above the level of the ventricles and the parietal lobe there is a 13 mm oval focus of low attenuation.  IMPRESSION: Significant chronic involutional change with evidence of chronic ischemia in the white matter.  More focal oval area of low-attenuation left parietal lobe deep white matter above the level of the  ventricles. This could represent asymmetric ischemic change, but the possibility of subacute lacunar infarction is not excluded. Consider MRI if indicated to better evaluate.   Electronically Signed   By: Skipper Cliche M.D.   On: 05/12/2013 13:56     EKG Interpretation   Date/Time:  Monday May 12 2013 12:58:00 EST Ventricular Rate:  75 PR Interval:  134 QRS Duration: 96 QT Interval:  396 QTC Calculation: 442 R Axis:   39 Text Interpretation:  Normal sinus rhythm Normal ECG No previous ECGs  available Confirmed by Cyrilla Durkin  MD, Jacqualyn Sedgwick [phone]) on 05/12/2013 1:05:34 PM      MDM   Final diagnoses:  CVA (cerebral infarction)   Right-sided weakness and numbness for the past 3 days. Concern for CVA. Patient is not a tPA candidate secondary to delayed presentation.  Exam and imaging concerning for acute/subacute stroke.  Not a TPA candidate given delay in presentation.  D/w Dr. Ree Kida who accepts patient to [facility] for stroke workup.   Ezequiel Essex, MD 05/12/13 1520

## 2013-05-12 NOTE — ED Notes (Signed)
Pt here with her son she lives with another son. Son who is here came to check on her and found her to have the weakness in right arm. Pt was last normal on Friday. She states " I have a confession I have not taken my medicine like I should for a long time" denies pain. Has some weakness in right leg

## 2013-05-12 NOTE — ED Notes (Signed)
Right arm weakness since Friday. Slight facial droop. States her right leg gives out at times.

## 2013-05-13 ENCOUNTER — Inpatient Hospital Stay (HOSPITAL_COMMUNITY): Payer: Medicare Other

## 2013-05-13 DIAGNOSIS — F329 Major depressive disorder, single episode, unspecified: Secondary | ICD-10-CM | POA: Diagnosis not present

## 2013-05-13 DIAGNOSIS — G8190 Hemiplegia, unspecified affecting unspecified side: Secondary | ICD-10-CM | POA: Diagnosis not present

## 2013-05-13 DIAGNOSIS — I6350 Cerebral infarction due to unspecified occlusion or stenosis of unspecified cerebral artery: Secondary | ICD-10-CM | POA: Diagnosis not present

## 2013-05-13 DIAGNOSIS — I10 Essential (primary) hypertension: Secondary | ICD-10-CM | POA: Diagnosis present

## 2013-05-13 DIAGNOSIS — F32A Depression, unspecified: Secondary | ICD-10-CM | POA: Diagnosis present

## 2013-05-13 DIAGNOSIS — I519 Heart disease, unspecified: Secondary | ICD-10-CM

## 2013-05-13 DIAGNOSIS — F3289 Other specified depressive episodes: Secondary | ICD-10-CM

## 2013-05-13 LAB — HEMOGLOBIN A1C
Hgb A1c MFr Bld: 5.8 % — ABNORMAL HIGH (ref <5.7)
MEAN PLASMA GLUCOSE: 120 mg/dL — AB (ref <117)

## 2013-05-13 LAB — LIPID PANEL
Cholesterol: 259 mg/dL — ABNORMAL HIGH (ref 0–200)
HDL: 75 mg/dL (ref >39)
LDL Cholesterol: 148 mg/dL — ABNORMAL HIGH (ref 0–99)
Total CHOL/HDL Ratio: 3.5 ratio
Triglycerides: 178 mg/dL — ABNORMAL HIGH (ref <150)
VLDL: 36 mg/dL (ref 0–40)

## 2013-05-13 MED ORDER — AMLODIPINE BESYLATE 2.5 MG PO TABS
2.5000 mg | ORAL_TABLET | Freq: Every day | ORAL | Status: DC
  Administered 2013-05-14: 2.5 mg via ORAL
  Filled 2013-05-13: qty 1

## 2013-05-13 NOTE — Progress Notes (Addendum)
Chart reviewed.   [facility] PROGRESS NOTE  Kathy Calhoun QPY:[id number] DOB: Apr 16, 1933 DOA: 05/12/2013 PCP: Mathews Argyle, MD  Assessment/Plan:  Active Problems:   CVA (cerebral infarction) MRI, MRA, echo pending. Doppler shows no significant stenosis. PT recommends CIR. Will consult HTN depression  Code Status:  full Family Communication:   Disposition Plan:  Cir?  Consultants:  PMR  Procedures:     Antibiotics:    HPI/Subjective: Stopped all meds when her husband died a few months ago. Interested in [place].  Objective: Filed Vitals:   05/13/13 1014  BP: 153/73  Pulse: 84  Temp: 97.9 F (36.6 C)  Resp: 18    Intake/Output Summary (Last 24 hours) at 05/13/13 1417 Last data filed at 05/13/13 1304  Gross per 24 hour  Intake   1080 ml  Output      0 ml  Net   1080 ml   Filed Weights   05/12/13 1217  Weight: 54.432 kg (120 lb)    Exam:   General:  In chair, alert and oriented  Cardiovascular: RRRwithout MGR  Respiratory: CTA without WRR  Abdomen: S, NT, ND  Ext: no CCE  Neuro: facial droop. Speech clear and fluent. Right UE and 4/5 motor  Basic Metabolic Panel:  Recent Labs Lab 05/12/13 1320 05/12/13 2000  NA 143  --   K 3.8  --   CL 102  --   CO2 26  --   GLUCOSE 115*  --   BUN 19  --   CREATININE 0.80 0.89  CALCIUM 10.2  --    Liver Function Tests:  Recent Labs Lab 05/12/13 1320  AST 20  ALT 15  ALKPHOS 84  BILITOT 0.4  PROT 7.8  ALBUMIN 4.0   No results found for this basename: LIPASE, AMYLASE,  in the last 168 hours No results found for this basename: AMMONIA,  in the last 168 hours CBC:  Recent Labs Lab 05/12/13 1320  WBC 6.4  NEUTROABS 3.0  HGB 14.2  HCT 42.6  MCV 93.6  PLT 290   Cardiac Enzymes:  Recent Labs Lab 05/12/13 1320  TROPONINI <0.30   BNP (last 3 results) No results found for this basename: PROBNP,  in the last 8760 hours CBG:  Recent Labs Lab 05/12/13 1409  GLUCAP  115*    No results found for this or any previous visit (from the past 240 hour(s)).   Studies: Dg Chest 2 View  05/13/2013   CLINICAL DATA:  Stroke.  EXAM: CHEST  2 VIEW  COMPARISON:  None available for comparison at time of study interpretation.  FINDINGS: The cardiac silhouette is mildly enlarged, mediastinal silhouette is nonsuspicious, mildly calcified aortic knob. Increased lung volumes with chronic interstitial changes. Biapical calcified pleural plaque. Linear density left lung base. No pleural effusions. No pneumothorax.  Widened right acromioclavicular joint may reflect remote injury. Multiple EKG lines overlie the patient and may obscure subtle underlying pathology. Moderate degenerative change of the lumbar spine with thoracolumbar levoscoliosis.  IMPRESSION: Mild cardiomegaly and COPD. Linear densities in left lung base may reflect atelectasis or scarring.   Electronically Signed   By: Elon Alas   On: 05/13/2013 06:04   Ct Head Wo Contrast  05/12/2013   CLINICAL DATA:  Right arm weakness since Friday  EXAM: CT HEAD WITHOUT CONTRAST  TECHNIQUE: Contiguous axial images were obtained from the base of the skull through the vertex without intravenous contrast.  COMPARISON:  None.  FINDINGS: Moderate diffuse atrophy. Severe  bilateral low attenuation in the deep periventricular white matter. There is no vascular territory infarct. There is no hemorrhage or extra-axial fluid. There is no mass effect or hydrocephalus. In the left corona radiata above the level of the ventricles and the parietal lobe there is a 13 mm oval focus of low attenuation.  IMPRESSION: Significant chronic involutional change with evidence of chronic ischemia in the white matter.  More focal oval area of low-attenuation left parietal lobe deep white matter above the level of the ventricles. This could represent asymmetric ischemic change, but the possibility of subacute lacunar infarction is not excluded. Consider MRI if  indicated to better evaluate.   Electronically Signed   By: Skipper Cliche M.D.   On: 05/12/2013 13:56    Scheduled Meds: . aspirin  325 mg Oral Daily  . cholecalciferol  2,000 Units Oral Daily  . DULoxetine  30 mg Oral Daily  . enoxaparin (LOVENOX) injection  40 mg Subcutaneous Q24H  . hydrochlorothiazide  12.5 mg Oral Daily  . multivitamin with minerals  1 tablet Oral Daily   Continuous Infusions:   Time spent: 35 minutes  [facility] Pager [phone]. If 7PM-7AM, please contact night-coverage at [email or web address], password [facility] 05/13/2013, 2:17 PM  LOS: 1 day

## 2013-05-13 NOTE — Progress Notes (Signed)
SLP Cancellation Note  Patient Details Name: Kathy Calhoun MRN: [id number] DOB: Feb 08, 1934   Cancelled treatment:       Reason Eval/Treat Not Completed: SLP screened, no needs identified, will sign off   Juan Quam Laurice 05/13/2013, 3:06 PM

## 2013-05-13 NOTE — Evaluation (Signed)
Physical Therapy Evaluation Patient Details Name: Kathy Calhoun MRN: [id number] DOB: 04-13-1933 Today's Date: 05/13/2013 Time: 8527-7824 PT Time Calculation (min): 27 min  PT Assessment / Plan / Recommendation History of Present Illness  pt presents with R sided weakness.    Clinical Impression  Pt very motivated and anticipate great progress.  Pt would benefit from CIR at D/C to maximize independence prior to returning home with her adopted son.  Will continue to follow.      PT Assessment  Patient needs continued PT services    Follow Up Recommendations  CIR    Does the patient have the potential to tolerate intense rehabilitation      Barriers to Discharge        Equipment Recommendations   (TBD)    Recommendations for Other Services Rehab consult   Frequency Min 4X/week    Precautions / Restrictions Precautions Precautions: Fall Restrictions Weight Bearing Restrictions: No   Pertinent Vitals/Pain Indicates chronic R shoulder pain.  Repositioned.        Mobility  Bed Mobility Overal bed mobility: Needs Assistance Bed Mobility: Supine to Sit Supine to sit: Min assist General bed mobility comments: A to bring trunk up to sitting.  pt utilizes bed rail with L UE.   Transfers Overall transfer level: Needs assistance Equipment used: None Transfers: Sit to/from Stand Sit to Stand: Min assist General transfer comment: A for balance on R side.  pt demo good use of L UE for support.   Ambulation/Gait Ambulation/Gait assistance: Min assist Ambulation Distance (Feet): 50 Feet Assistive device: 1 person hand held assist Gait Pattern/deviations: Step-through pattern;Decreased stride length;Decreased dorsiflexion - right;Decreased weight shift to right Gait velocity interpretation: Below normal speed for age/gender General Gait Details: pt with occasional weakness in R knee requiring MinA to prevent buckling and fall.   Modified Rankin (Stroke Patients Only) Pre-Morbid  Rankin Score: No significant disability Modified Rankin: Moderately severe disability    Exercises     PT Diagnosis: Difficulty walking  PT Problem List: Decreased strength;Decreased activity tolerance;Decreased balance;Decreased mobility;Decreased coordination;Decreased knowledge of use of DME;Impaired sensation PT Treatment Interventions: DME instruction;Gait training;Stair training;Functional mobility training;Therapeutic activities;Therapeutic exercise;Balance training;Neuromuscular re-education;Patient/family education     PT Goals(Current goals can be found in the care plan section) Acute Rehab PT Goals Patient Stated Goal: Visit family in [place] in April PT Goal Formulation: With patient Time For Goal Achievement: 05/27/13 Potential to Achieve Goals: Good  Visit Information  Last PT Received On: 05/13/13 Assistance Needed: +1 History of Present Illness: pt presents with R sided weakness.         Prior [place] expects to be discharged to:: Inpatient rehab Living Arrangements: Children Additional Comments: pt recently lost her husband.   Prior Function Level of Independence: Independent Comments: pt states sometimes she has her son carry the laundry basket up/down the basement stairs.   Communication Communication: No difficulties Dominant Hand: Right    Cognition  Cognition Arousal/Alertness: Awake/alert Behavior During Therapy: WFL for tasks assessed/performed Overall Cognitive Status: Within Functional Limits for tasks assessed    Extremity/Trunk Assessment Upper Extremity Assessment Upper Extremity Assessment: Defer to OT evaluation Lower Extremity Assessment Lower Extremity Assessment: RLE deficits/detail RLE Deficits / Details: AROM WFL, strength knee/hip 3/5 and ankle/foot 2/5.   RLE Sensation: decreased light touch RLE Coordination: decreased fine motor Cervical / Trunk Assessment Cervical / Trunk Assessment: Normal    Balance Balance Overall balance assessment: Needs assistance Standing balance support: No upper extremity  supported Standing balance-Leahy Scale: Poor  End of Session PT - End of Session Equipment Utilized During Treatment: Gait belt Activity Tolerance: Patient tolerated treatment well Patient left: in chair;with call bell/phone within reach Nurse Communication: Mobility status  GP     Catarina Hartshorn, [place] 05/13/2013, 8:26 AM

## 2013-05-13 NOTE — Progress Notes (Signed)
Rehab Admissions Coordinator Note:  Patient was screened by Retta Diones for appropriateness for an Inpatient Acute Rehab Consult.  At this time, we are recommending Inpatient Rehab consult.  Retta Diones 05/13/2013, 8:40 AM  I can be reached at [phone].

## 2013-05-13 NOTE — Consult Note (Signed)
Physical Medicine and Rehabilitation Consult Reason for Consult: CVA Referring Physician: [facility]   HPI: Kathy Calhoun is a 80 y.o. right-handed female with history of hypertension and right shoulder rotator cuff surgery several years ago. Patient independent and active prior to admission. Admitted 05/12/2013 with right-sided weakness. CT of the head showed focal oval area of low attenuation left parietal lobe deep white matter above the level of the ventricles. MRI showed acute infarct within the left posterior frontal lobe, precentral gyrus. MRA of the head without stenosis. Patient did not receive TPA. Echocardiogram with ejection fraction 55% grade 1 diastolic dysfunction. Carotid Dopplers with no ICA stenosis. Placed on aspirin for CVA prophylaxis as well as subcutaneous Lovenox for DVT prophylaxis. Patient is on a regular diet. Physical therapy evaluation completed 05/13/2013 with recommendations for physical medicine rehabilitation consult.  Patient notes recently widowed. Does have 20 year old adopted son at home can help  Review of Systems  Musculoskeletal: Positive for joint pain and myalgias.  Psychiatric/Behavioral: Positive for depression.  All other systems reviewed and are negative.   Past Medical History  Diagnosis Date  . Osteoporosis   . HTN (hypertension)   . Disc     bulging lumbar disc has had steriod injections  x3   Past Surgical History  Procedure Laterality Date  . Cataract extraction      bilaterally 2011  . Appendectomy    . Tubal ligation    . Hysterotomy    . Oophorectomy    . Laparotomy      large benign mass removed 1993  . Nasal septum surgery     No family history on file. Social History:  reports that she has never smoked. She does not have any smokeless tobacco history on file. She reports that she does not drink alcohol or use illicit drugs. Allergies:  Allergies  Allergen Reactions  . Codeine Rash  . Sulfa Antibiotics Rash    Medications Prior to Admission  Medication Sig Dispense Refill  . amLODipine (NORVASC) 2.5 MG tablet Take 2.5 mg by mouth daily.      . Calcium-Vitamin D (CALTRATE 600 PLUS-VIT D PO) Take 1 tablet by mouth daily.       . cholecalciferol (VITAMIN D) 1000 UNITS tablet Take 2,000 Units by mouth daily.      . DULoxetine (CYMBALTA) 30 MG capsule Take 30 mg by mouth daily.      Marland Kitchen estrogens, conjugated, (PREMARIN) 0.9 MG tablet Take 0.9 mg by mouth daily. Take daily for 21 days then do not take for 7 days.      . hydrochlorothiazide (MICROZIDE) 12.5 MG capsule Take 12.5 mg by mouth daily.      . meloxicam (MOBIC) 15 MG tablet Take 15 mg by mouth daily.      . Multiple Vitamins-Minerals (MULTIVITAMIN WITH MINERALS) tablet Take 1 tablet by mouth daily.        Home: Home Living Family/patient expects to be discharged to:: Inpatient rehab Living Arrangements: Children Additional Comments: pt recently lost her husband.    Functional History: Prior Function Comments: pt states sometimes she has her son carry the laundry basket up/down the basement stairs.   Functional Status:  Mobility:     Ambulation/Gait Ambulation Distance (Feet): 50 Feet General Gait Details: pt with occasional weakness in R knee requiring MinA to prevent buckling and fall.      ADL:    Cognition: Cognition Overall Cognitive Status: Within Functional Limits for tasks assessed Orientation Level:  Oriented X4 Cognition Arousal/Alertness: Awake/alert Behavior During Therapy: WFL for tasks assessed/performed Overall Cognitive Status: Within Functional Limits for tasks assessed  Blood pressure 157/53, pulse 81, temperature 97.4 F (36.3 C), temperature source Oral, resp. rate 18, height 4\' 11"  (1.499 m), weight 54.432 kg (120 lb), SpO2 97.00%. Physical Exam  Vitals reviewed. Constitutional: She is oriented to person, place, and time. She appears well-developed.  HENT:  Head: Normocephalic.  Eyes: EOM are normal.   Neck: Normal range of motion. Neck supple. No thyromegaly present.  Cardiovascular: Normal rate and regular rhythm.   Respiratory: Effort normal and breath sounds normal. No respiratory distress.  GI: Soft. Bowel sounds are normal. She exhibits no distension.  Neurological: She is alert and oriented to person, place, and time.  Makes good eye contact with examiner. Follows all commands  Skin: Skin is warm and dry.  Psychiatric: She has a normal mood and affect.   motor strength is 2 minus in the right deltoid, bicep, tricep, grip, finger flexors and extensors 4 minus in the right hip flexor knee extensors 3 minus at the ankle dorsiflexor plantar flexor 5/5 in the left deltoid, bicep and tricep, grip, hip flexor, knee extension, ankle dorsiflexor and plantar flexor Sensation reduced to light touch on the right side upper greater than lower extremity. Is able to distinguish which finger was touched No visual field cuts  Results for orders placed during the hospital encounter of 05/12/13 (from the past 24 hour(s))  CREATININE, SERUM     Status: Abnormal   Collection Time    05/12/13  8:00 PM      Result Value Ref Range   Creatinine, Ser 0.89  0.50 - 1.10 mg/dL   GFR calc non Af Amer 61 (*) >90 mL/min   GFR calc Af Amer 71 (*) >90 mL/min  LIPID PANEL     Status: Abnormal   Collection Time    05/13/13  4:40 AM      Result Value Ref Range   Cholesterol 259 (*) 0 - 200 mg/dL   Triglycerides 178 (*) <150 mg/dL   HDL 75  >39 mg/dL   Total CHOL/HDL Ratio 3.5     VLDL 36  0 - 40 mg/dL   LDL Cholesterol 148 (*) 0 - 99 mg/dL   Dg Chest 2 View  05/13/2013   CLINICAL DATA:  Stroke.  EXAM: CHEST  2 VIEW  COMPARISON:  None available for comparison at time of study interpretation.  FINDINGS: The cardiac silhouette is mildly enlarged, mediastinal silhouette is nonsuspicious, mildly calcified aortic knob. Increased lung volumes with chronic interstitial changes. Biapical calcified pleural plaque.  Linear density left lung base. No pleural effusions. No pneumothorax.  Widened right acromioclavicular joint may reflect remote injury. Multiple EKG lines overlie the patient and may obscure subtle underlying pathology. Moderate degenerative change of the lumbar spine with thoracolumbar levoscoliosis.  IMPRESSION: Mild cardiomegaly and COPD. Linear densities in left lung base may reflect atelectasis or scarring.   Electronically Signed   By: Elon Alas   On: 05/13/2013 06:04   Ct Head Wo Contrast  05/12/2013   CLINICAL DATA:  Right arm weakness since Friday  EXAM: CT HEAD WITHOUT CONTRAST  TECHNIQUE: Contiguous axial images were obtained from the base of the skull through the vertex without intravenous contrast.  COMPARISON:  None.  FINDINGS: Moderate diffuse atrophy. Severe bilateral low attenuation in the deep periventricular white matter. There is no vascular territory infarct. There is no hemorrhage or extra-axial fluid. There  is no mass effect or hydrocephalus. In the left corona radiata above the level of the ventricles and the parietal lobe there is a 13 mm oval focus of low attenuation.  IMPRESSION: Significant chronic involutional change with evidence of chronic ischemia in the white matter.  More focal oval area of low-attenuation left parietal lobe deep white matter above the level of the ventricles. This could represent asymmetric ischemic change, but the possibility of subacute lacunar infarction is not excluded. Consider MRI if indicated to better evaluate.   Electronically Signed   By: Skipper Cliche M.D.   On: 05/12/2013 13:56    Assessment/Plan: Diagnosis: Left posterior frontal infarct with right hemiparesis 1. Does the need for close, 24 hr/day medical supervision in concert with the patient's rehab needs make it unreasonable for this patient to be served in a less intensive setting? Yes 2. Co-Morbidities requiring supervision/potential complications: Hypertension, depression, fall  risk 3. Due to bladder management, bowel management, safety, skin/wound care, disease management, medication administration and patient education, does the patient require 24 hr/day rehab nursing? Yes 4. Does the patient require coordinated care of a physician, rehab nurse, PT (1-2 hrs/day, 5 days/week) and OT (1-2 hrs/day, 5 days/week) to address physical and functional deficits in the context of the above medical diagnosis(es)? Yes Addressing deficits in the following areas: balance, endurance, locomotion, strength, transferring, bowel/bladder control, bathing, dressing and toileting 5. Can the patient actively participate in an intensive therapy program of at least 3 hrs of therapy per day at least 5 days per week? Yes 6. The potential for patient to make measurable gains while on inpatient rehab is excellent 7. Anticipated functional outcomes upon discharge from inpatient rehab are modified independent with PT, modified independent with OT, NA with SLP. 8. Estimated rehab length of stay to reach the above functional goals is: 6-8 days 9. Does the patient have adequate social supports to accommodate these discharge functional goals? Yes 10. Anticipated D/C setting: Home 11. Anticipated post D/C treatments: [place] therapy 12. Overall Rehab/Functional Prognosis: excellent  RECOMMENDATIONS: This patient's condition is appropriate for continued rehabilitative care in the following setting: CIR Patient has agreed to participate in recommended program. Yes Note that insurance prior authorization may be required for reimbursement for recommended care.  Comment: Patient states she does not have transportation to outpatient therapy    05/13/2013

## 2013-05-13 NOTE — Progress Notes (Signed)
Echocardiogram 2D Echocardiogram has been performed.  05/13/2013 10:51 AM Maudry Mayhew, RVT, RDCS, RDMS

## 2013-05-13 NOTE — Progress Notes (Signed)
*  PRELIMINARY RESULTS* Vascular Ultrasound Carotid Duplex (Doppler) has been completed.   Findings suggest 1-39% internal carotid artery stenosis bilaterally. Vertebral arteries are patent with antegrade flow.  05/13/2013 10:52 AM Maudry Mayhew, RVT, RDCS, RDMS

## 2013-05-14 ENCOUNTER — Encounter (HOSPITAL_COMMUNITY): Payer: Self-pay | Admitting: Physical Medicine and Rehabilitation

## 2013-05-14 ENCOUNTER — Inpatient Hospital Stay (HOSPITAL_COMMUNITY)
Admission: RE | Admit: 2013-05-14 | Discharge: 2013-05-21 | DRG: 945 | Disposition: A | Discharge status: Home Health | Payer: Medicare Other | Source: Intra-hospital | Attending: Physical Medicine & Rehabilitation | Admitting: Physical Medicine & Rehabilitation

## 2013-05-14 ENCOUNTER — Encounter (HOSPITAL_COMMUNITY): Payer: Self-pay | Admitting: *Deleted

## 2013-05-14 DIAGNOSIS — R29810 Facial weakness: Secondary | ICD-10-CM | POA: Diagnosis present

## 2013-05-14 DIAGNOSIS — Z9851 Tubal ligation status: Secondary | ICD-10-CM

## 2013-05-14 DIAGNOSIS — I6350 Cerebral infarction due to unspecified occlusion or stenosis of unspecified cerebral artery: Secondary | ICD-10-CM | POA: Diagnosis not present

## 2013-05-14 DIAGNOSIS — I6330 Cerebral infarction due to thrombosis of unspecified cerebral artery: Secondary | ICD-10-CM

## 2013-05-14 DIAGNOSIS — Z9119 Patient's noncompliance with other medical treatment and regimen: Secondary | ICD-10-CM

## 2013-05-14 DIAGNOSIS — G589 Mononeuropathy, unspecified: Secondary | ICD-10-CM | POA: Diagnosis present

## 2013-05-14 DIAGNOSIS — E785 Hyperlipidemia, unspecified: Secondary | ICD-10-CM | POA: Diagnosis present

## 2013-05-14 DIAGNOSIS — Z91199 Patient's noncompliance with other medical treatment and regimen due to unspecified reason: Secondary | ICD-10-CM

## 2013-05-14 DIAGNOSIS — F329 Major depressive disorder, single episode, unspecified: Secondary | ICD-10-CM | POA: Diagnosis not present

## 2013-05-14 DIAGNOSIS — Z9089 Acquired absence of other organs: Secondary | ICD-10-CM

## 2013-05-14 DIAGNOSIS — Z9849 Cataract extraction status, unspecified eye: Secondary | ICD-10-CM

## 2013-05-14 DIAGNOSIS — Z5189 Encounter for other specified aftercare: Principal | ICD-10-CM

## 2013-05-14 DIAGNOSIS — M545 Low back pain, unspecified: Secondary | ICD-10-CM | POA: Diagnosis present

## 2013-05-14 DIAGNOSIS — M25519 Pain in unspecified shoulder: Secondary | ICD-10-CM | POA: Diagnosis present

## 2013-05-14 DIAGNOSIS — R471 Dysarthria and anarthria: Secondary | ICD-10-CM | POA: Diagnosis present

## 2013-05-14 DIAGNOSIS — I639 Cerebral infarction, unspecified: Secondary | ICD-10-CM

## 2013-05-14 DIAGNOSIS — Z888 Allergy status to other drugs, medicaments and biological substances status: Secondary | ICD-10-CM | POA: Diagnosis not present

## 2013-05-14 DIAGNOSIS — I10 Essential (primary) hypertension: Secondary | ICD-10-CM | POA: Diagnosis present

## 2013-05-14 DIAGNOSIS — Z882 Allergy status to sulfonamides status: Secondary | ICD-10-CM

## 2013-05-14 DIAGNOSIS — R29898 Other symptoms and signs involving the musculoskeletal system: Secondary | ICD-10-CM | POA: Diagnosis present

## 2013-05-14 DIAGNOSIS — Z7982 Long term (current) use of aspirin: Secondary | ICD-10-CM | POA: Diagnosis not present

## 2013-05-14 DIAGNOSIS — M810 Age-related osteoporosis without current pathological fracture: Secondary | ICD-10-CM | POA: Diagnosis present

## 2013-05-14 DIAGNOSIS — Z79899 Other long term (current) drug therapy: Secondary | ICD-10-CM | POA: Diagnosis not present

## 2013-05-14 DIAGNOSIS — G8929 Other chronic pain: Secondary | ICD-10-CM | POA: Diagnosis present

## 2013-05-14 DIAGNOSIS — G8110 Spastic hemiplegia affecting unspecified side: Secondary | ICD-10-CM

## 2013-05-14 LAB — URINALYSIS, ROUTINE W REFLEX MICROSCOPIC
Bilirubin Urine: NEGATIVE
Glucose, UA: NEGATIVE mg/dL
Ketones, ur: NEGATIVE mg/dL
Nitrite: NEGATIVE
PH: 6 (ref 5.0–8.0)
Protein, ur: NEGATIVE mg/dL
Specific Gravity, Urine: 1.012 (ref 1.005–1.030)
UROBILINOGEN UA: 0.2 mg/dL (ref 0.0–1.0)

## 2013-05-14 LAB — URINE MICROSCOPIC-ADD ON

## 2013-05-14 LAB — URINE CULTURE
COLONY COUNT: NO GROWTH
Culture: NO GROWTH

## 2013-05-14 MED ORDER — SIMVASTATIN 20 MG PO TABS
20.0000 mg | ORAL_TABLET | Freq: Every day | ORAL | Status: DC
  Administered 2013-05-15 – 2013-05-20 (×6): 20 mg via ORAL
  Filled 2013-05-14 (×7): qty 1

## 2013-05-14 MED ORDER — BISACODYL 10 MG RE SUPP: 10.0000 mg | Freq: Every day | RECTAL | Status: DC | PRN

## 2013-05-14 MED ORDER — SENNOSIDES-DOCUSATE SODIUM 8.6-50 MG PO TABS: 1.0000 | ORAL_TABLET | Freq: Every evening | ORAL | Status: DC | PRN

## 2013-05-14 MED ORDER — ACETAMINOPHEN 325 MG PO TABS
325.0000 mg | ORAL_TABLET | ORAL | Status: DC | PRN
  Administered 2013-05-21: 650 mg via ORAL
  Filled 2013-05-14: qty 2

## 2013-05-14 MED ORDER — AMLODIPINE BESYLATE 2.5 MG PO TABS
2.5000 mg | ORAL_TABLET | Freq: Every day | ORAL | Status: DC
  Administered 2013-05-15 – 2013-05-21 (×7): 2.5 mg via ORAL
  Filled 2013-05-14 (×8): qty 1

## 2013-05-14 MED ORDER — ENOXAPARIN SODIUM 40 MG/0.4ML ~~LOC~~ SOLN
40.0000 mg | SUBCUTANEOUS | Status: DC
  Administered 2013-05-14 – 2013-05-20 (×7): 40 mg via SUBCUTANEOUS
  Filled 2013-05-14 (×8): qty 0.4

## 2013-05-14 MED ORDER — ASPIRIN EC 325 MG PO TBEC
325.0000 mg | DELAYED_RELEASE_TABLET | Freq: Every day | ORAL | Status: DC
  Administered 2013-05-14 – 2013-05-21 (×8): 325 mg via ORAL
  Filled 2013-05-14 (×9): qty 1

## 2013-05-14 MED ORDER — PROCHLORPERAZINE 25 MG RE SUPP
12.5000 mg | Freq: Four times a day (QID) | RECTAL | Status: DC | PRN
  Filled 2013-05-14: qty 1

## 2013-05-14 MED ORDER — VITAMIN D3 25 MCG (1000 UNIT) PO TABS
2000.0000 [IU] | ORAL_TABLET | Freq: Every day | ORAL | Status: DC
  Administered 2013-05-15 – 2013-05-21 (×7): 2000 [IU] via ORAL
  Filled 2013-05-14 (×8): qty 2

## 2013-05-14 MED ORDER — FLEET ENEMA 7-19 GM/118ML RE ENEM: 1.0000 | ENEMA | Freq: Every day | RECTAL | Status: DC | PRN

## 2013-05-14 MED ORDER — ADULT MULTIVITAMIN W/MINERALS CH
1.0000 | ORAL_TABLET | Freq: Every day | ORAL | Status: DC
  Administered 2013-05-15 – 2013-05-21 (×7): 1 via ORAL
  Filled 2013-05-14 (×8): qty 1

## 2013-05-14 MED ORDER — DICLOFENAC SODIUM 1 % TD GEL
2.0000 g | Freq: Three times a day (TID) | TRANSDERMAL | Status: DC
  Administered 2013-05-15 – 2013-05-21 (×18): 2 g via TOPICAL
  Filled 2013-05-14 (×2): qty 100

## 2013-05-14 MED ORDER — ASPIRIN 325 MG PO TABS: 325.0000 mg | ORAL_TABLET | Freq: Every day | ORAL | Status: DC

## 2013-05-14 MED ORDER — DULOXETINE HCL 30 MG PO CPEP
30.0000 mg | ORAL_CAPSULE | Freq: Every day | ORAL | Status: DC
  Administered 2013-05-15 – 2013-05-21 (×7): 30 mg via ORAL
  Filled 2013-05-14 (×8): qty 1

## 2013-05-14 MED ORDER — TRAZODONE HCL 50 MG PO TABS
25.0000 mg | ORAL_TABLET | Freq: Every evening | ORAL | Status: DC | PRN
Start: 2013-05-14 — End: 2013-05-21

## 2013-05-14 MED ORDER — GUAIFENESIN-DM 100-10 MG/5ML PO SYRP: 5.0000 mL | ORAL_SOLUTION | Freq: Four times a day (QID) | ORAL | Status: DC | PRN

## 2013-05-14 MED ORDER — ALUM & MAG HYDROXIDE-SIMETH 200-200-20 MG/5ML PO SUSP: 30.0000 mL | ORAL | Status: DC | PRN

## 2013-05-14 MED ORDER — PROCHLORPERAZINE MALEATE 5 MG PO TABS
5.0000 mg | ORAL_TABLET | Freq: Four times a day (QID) | ORAL | Status: DC | PRN
  Filled 2013-05-14: qty 2

## 2013-05-14 MED ORDER — PROCHLORPERAZINE EDISYLATE 5 MG/ML IJ SOLN
5.0000 mg | Freq: Four times a day (QID) | INTRAMUSCULAR | Status: DC | PRN
  Filled 2013-05-14: qty 2

## 2013-05-14 MED ORDER — ATORVASTATIN CALCIUM 20 MG PO TABS: 20.0000 mg | ORAL_TABLET | Freq: Every day | ORAL | Status: DC

## 2013-05-14 NOTE — Progress Notes (Signed)
Physical Therapy Treatment Patient Details Name: Kathy Calhoun MRN: [id number] DOB: 17-Feb-1934 Today's Date: 05/14/2013 Time: 9924-2683 PT Time Calculation (min): 23 min  PT Assessment / Plan / Recommendation  History of Present Illness pt presents with R sided weakness.     PT Comments   Pt eager for mobility and attempts to follow all cueing.  Pt continues with R LE weakness and unsteady with balance.  Still feel pt would be great candidate for CIR.  Will continue to follow.    Follow Up Recommendations  CIR     Does the patient have the potential to tolerate intense rehabilitation     Barriers to Discharge        Equipment Recommendations   (TBD)    Recommendations for Other Services    Frequency Min 4X/week   Progress towards PT Goals Progress towards PT goals: Progressing toward goals  Plan Current plan remains appropriate    Precautions / Restrictions Precautions Precautions: Fall Restrictions Weight Bearing Restrictions: No   Pertinent Vitals/Pain Denied pain.      Mobility  Transfers Overall transfer level: Needs assistance Equipment used: None Transfers: Sit to/from Stand Sit to Stand: Min assist General transfer comment: A for balance on R side.  pt demo good use of L UE for support.   Ambulation/Gait Ambulation/Gait assistance: Min assist Ambulation Distance (Feet): 70 Feet (x2) Assistive device: 1 person hand held assist Gait Pattern/deviations: Step-through pattern;Decreased stride length;Decreased stance time - right;Decreased dorsiflexion - right;Decreased weight shift to right Gait velocity interpretation: Below normal speed for age/gender General Gait Details: pt continues with R knee instability and decreased DF on R.  cueing for increased heel strike and step length on R side.      Exercises General Exercises - Lower Extremity Ankle Circles/Pumps: AROM;Right;10 reps Quad Sets: AROM;Right;10 reps   PT Diagnosis:    PT Problem List:   PT  Treatment Interventions:     PT Goals (current goals can now be found in the care plan section) Acute Rehab PT Goals Patient Stated Goal: Visit family in [place] in April Time For Goal Achievement: 05/27/13 Potential to Achieve Goals: Good  Visit Information  Last PT Received On: 05/14/13 Assistance Needed: +1 History of Present Illness: pt presents with R sided weakness.      Subjective Data  Patient Stated Goal: Visit family in [place] in April   Cognition  Cognition Arousal/Alertness: Awake/alert Behavior During Therapy: WFL for tasks assessed/performed Overall Cognitive Status: Within Functional Limits for tasks assessed    Balance  Balance Overall balance assessment: Needs assistance Standing balance support: No upper extremity supported Standing balance-Leahy Scale: Poor  End of Session PT - End of Session Equipment Utilized During Treatment: Gait belt Activity Tolerance: Patient tolerated treatment well Patient left: in chair;with call bell/phone within reach Nurse Communication: Mobility status   GP     Catarina Hartshorn, [place] 05/14/2013, 9:54 AM

## 2013-05-14 NOTE — Progress Notes (Signed)
OT Cancellation Note  Patient Details Name: Kathy Calhoun MRN: [id number] DOB: 1933/10/10   Cancelled Treatment:    Reason Eval/Treat Not Completed: Other (comment) (Pt planning to d/c to CIR today.)   Benito Mccreedy OTR/L [phone]  05/14/2013, 3:03 PM

## 2013-05-14 NOTE — PMR Pre-admission (Signed)
PMR Admission Coordinator Pre-Admission Assessment  Patient: Kathy Calhoun is an 80 y.o., female MRN: [id number] DOB: 1933/06/20 Height: 4\' 11"  (149.9 cm) Weight: 54.432 kg (120 lb)              Insurance Information HMO: No    PPO:       PCP:       IPA:       80/20:       OTHER:   PRIMARY: Medicare A/B      Policy#: [id number]      Subscriber: Desiree Hane CM Name:        Phone#:       Fax#:   Pre-Cert#:        Employer: Retired Benefits:  Phone #:       Name: Checked in Calhoun. Date: 07/11/00     Deduct: $1260      Out of Pocket Max: none      Life Max: unlimited CIR: 100%      SNF: 100 days Outpatient: 80%     Co-Pay: 20% Home Health: 100%      Co-Pay: none DME: 80%     Co-Pay: 20% Providers: patient's choice  SECONDARY: UHC      Policy#: [id number]      Subscriber: Desiree Hane CM Name:        Phone#:       Fax#:   Pre-Cert#:        Employer: Retired Benefits:  Phone #: [phone]     Name:    Emergency Contact Information Contact Information   Name Relation Home Work Mobile   Rios,[name] Daughter [phone]  [phone]   Noriana, Echavarria [phone]     Shyloh, Ehmann [phone]       Current Medical History  Patient Admitting Diagnosis: Left posterior frontal infarct with right hemiparesis   History of Present Illness: Kathy Calhoun is a 80 y.o. right-handed female with history of hypertension and right shoulder rotator cuff surgery several years ago. Patient independent and active prior to admission. Admitted 05/12/2013 with right-sided weakness. CT of the head showed focal oval area of low attenuation left parietal lobe deep white matter above the level of the ventricles. MRI showed acute infarct within the left posterior frontal lobe, precentral gyrus. MRA of the head without stenosis. Patient did not receive TPA. Echocardiogram with ejection fraction [id number] grade 1 diastolic dysfunction. Carotid Dopplers with no ICA stenosis. Placed on aspirin for CVA prophylaxis as  well as subcutaneous Lovenox for DVT prophylaxis. Patient is on a regular diet. Physical therapy evaluation completed 05/13/2013 with recommendations for physical medicine rehabilitation consult  NIH Total: 6  Past Medical History  Past Medical History  Diagnosis Date  . Osteoporosis   . HTN (hypertension)   . Disc     bulging lumbar disc has had steriod injections  x3    Family History  family history includes Cancer in her father; Dementia in her brother and sister.  Prior Rehab/Hospitalizations: none recently, previous outpt PT for shoulder rotator cuff sx. approx 12 years ago  Current Medications  Current facility-administered medications:amLODipine (NORVASC) tablet 2.5 mg, 2.5 mg, Oral, Daily, Delfina Redwood, MD, 2.5 mg at 05/14/13 [id number];  aspirin tablet 325 mg, 325 mg, Oral, Daily, Shanda Howells, MD, 325 mg at 05/14/13 [id number];  cholecalciferol (VITAMIN D) tablet 2,000 Units, 2,000 Units, Oral, Daily, Shanda Howells, MD, 2,000 Units at 05/14/13 [id number] DULoxetine (CYMBALTA) DR capsule 30 mg, 30 mg, Oral, Daily, Shanda Howells,  MD, 30 mg at 05/14/13 5427;  enoxaparin (LOVENOX) injection 40 mg, 40 mg, Subcutaneous, Q24H, Shanda Howells, MD, 40 mg at 05/13/13 2034;  multivitamin with minerals tablet 1 tablet, 1 tablet, Oral, Daily, Shanda Howells, MD, 1 tablet at 05/14/13 0623;  senna-docusate (Senokot-S) tablet 1 tablet, 1 tablet, Oral, QHS PRN, Shanda Howells, MD  Patients Current Diet: Cardiac  Precautions / Restrictions Precautions Precautions: Fall Restrictions Weight Bearing Restrictions: No   Prior Activity Level Household: has not driven since Nov. as they sold her late husband's truck Community (5-7x/wk): Walks 1 mile/day.  Was driving, but sold her husband's truck last November.  Home Assistive Devices / Equipment Home Assistive Devices/Equipment: None  Prior Functional Level Prior Function Level of Independence: Independent Comments: pt states sometimes she has her son  carry the laundry basket up/down the basement stairs. Pt does state that she often would go out the front door (with one step) and walk around to the back of the house with a level entrance so she won't have to negotiate the steps.    Current Functional Level Cognition  Overall Cognitive Status: Within Functional Limits for tasks assessed Orientation Level: Oriented X4    Extremity Assessment (includes Sensation/Coordination)          ADLs  Not assessed, anticipate need due to R UE hemiplegia    Mobility  Overal bed mobility: Needs Assistance Bed Mobility: Supine to Sit Supine to sit: Min assist General bed mobility comments: A to bring trunk up to sitting.  pt utilizes bed rail with L UE.      Transfers  Overall transfer level: Needs assistance Equipment used: None Transfers: Sit to/from Stand Sit to Stand: Min assist General transfer comment: A for balance on R side.  pt demo good use of L UE for support.      Ambulation / Gait / Stairs / Wheelchair Mobility  Ambulation/Gait Ambulation/Gait assistance: Museum/gallery curator (Feet): 70 Feet (x2) Assistive device: 1 person hand held assist Gait Pattern/deviations: Step-through pattern;Decreased stride length;Decreased stance time - right;Decreased dorsiflexion - right;Decreased weight shift to right Gait velocity interpretation: Below normal speed for age/gender General Gait Details: pt continues with R knee instability and decreased DF on R.  cueing for increased heel strike and step length on R side.      Posture / Balance      Special needs/care consideration BiPAP/CPAP no  CPM no  Continuous Drip IV no  Dialysis no         Life Vest no  Oxygen no Special Bed no  Trach Size no Wound Vac (area) no      Skin no                              Bowel mgmt: last BM 05-13-13 Bladder mgmt: currently ambulating to commode with assist  Diabetic mgmt no   Previous Home Environment Living Arrangements: Children  (lives with her 62 yo adopted son and family friend lives with them on the weekends around his work schedule. Son Octavia Bruckner lives in [place] and daughter Santiago Glad lives in [place]).   Home Care Services: No Additional Comments: pt recently lost her husband in Nov. 2014.    Discharge Living Setting Plans for Discharge Living Setting: Patient's home Type of Home at Discharge: House Discharge Home Layout: Laundry or work area in basement;Two level;Able to live on main level with bedroom/bathroom Alternate Level Stairs-Number of Steps: flight Discharge Home  Access: Stairs to enter [employer] of Steps: 1 Does the patient have any problems obtaining your medications?: No  Social/Family/Support Systems Patient Roles: Parent (parent to 67 yo adopted son) Sport and exercise psychologist Information: son Octavia Bruckner is primary contact, works in [place] Anticipated Caregiver: adopted son Audry Pili will be primary caregiver as he lives with pt Anticipated Ambulance person Information:  (see above) Ability/Limitations of Caregiver: no limitations Caregiver Availability: 24/7 Discharge Plan Discussed with Primary Caregiver: Yes (spoke with Octavia Bruckner, son in charge of affairs by phone on 05-14-13) Is Caregiver In Agreement with Plan?: Yes Does Caregiver/Family have Issues with Lodging/Transportation while Pt is in Rehab?: No  Goals/Additional Needs Patient/Family Goal for Rehab: mod I w/ PT and OT Expected length of stay: 6-8 days Cultural Considerations: none Dietary Needs: regular diet Equipment Needs: to be determined Pt/Family Agrees to Admission and willing to participate: Yes (spoke with pt's son Tim by phone on 05-14-13) Program Orientation Provided & Reviewed with Pt/Caregiver Including Roles  & Responsibilities: Yes  Decrease burden of Care through IP rehab admission: NA   Possible need for SNF placement upon discharge: not anticipated    Patient Condition: This patient's condition remains as documented in the consult  dated 05-14-13, in which the Rehabilitation Physician determined and documented that the patient's condition is appropriate for intensive rehabilitative care in an inpatient rehabilitation facility. Will admit to inpatient rehab today.  Preadmission Screen Completed By:  Nanetta Batty, PT  05/14/2013 4:06 PM ______________________________________________________________________   Discussed status with Dr. Naaman Plummer on 05-14-13 at 1618 and received telephone approval for admission today.  Admission Coordinator:  Nanetta Batty, PT time1618/Date3-4-15

## 2013-05-14 NOTE — Plan of Care (Signed)
Report called to Colletta Maryland, RN at rehab unit.

## 2013-05-14 NOTE — Progress Notes (Signed)
UR complete.  Sheran Newstrom RN, MSN 

## 2013-05-14 NOTE — H&P (View-Only) (Signed)
Physical Medicine and Rehabilitation Admission H&P    Chief Complaint  Patient presents with  . Right sided weakness with difficulty walking   HPI:  Kathy Calhoun is a 80 y.o. right-handed female with history of hypertension--"no BP medication due to falls", chronic LBP and R-RTC problems with RUE weakness.  Admitted 05/12/2013 with 4-5 day history of right-sided weakness, right facial numbness and difficulty walking. CT of the head showed focal oval area of low attenuation left parietal lobe deep white matter above the level of the ventricles. MRI showed acute infarct within the left posterior frontal lobe, precentral gyrus. MRA of the head without stenosis.  Echocardiogram with ejection fraction 50% grade 1 diastolic dysfunction. Carotid Dopplers without  ICA stenosis. Patient placed on ASA for secondary stroke prevention. Therapy initiated and CIR recommended by rehab team.    Review of Systems  HENT: Negative for hearing loss.   Eyes: Negative for blurred vision and double vision.  Respiratory: Negative for cough and shortness of breath.   Cardiovascular: Negative for chest pain and palpitations.  Musculoskeletal: Positive for back pain (multiple ESI--Dr. Jacelyn Grip) and joint pain (right shoulder). Negative for falls (None since 10/14 once BP meds d/c) and myalgias.  Neurological: Positive for speech change and focal weakness. Negative for dizziness and headaches.  Psychiatric/Behavioral: Negative for depression. The patient does not have insomnia.     Past Medical History  Diagnosis Date  . Osteoporosis   . HTN (hypertension)   . Disc     bulging lumbar disc has had steriod injections  x3   Past Surgical History  Procedure Laterality Date  . Cataract extraction      bilaterally 2011  . Appendectomy    . Tubal ligation    . Hysterotomy    . Oophorectomy    . Laparotomy      large benign mass removed 1993  . Nasal septum surgery     Family History  Problem Relation Age of  Onset  . Cancer Father   . Dementia Brother   . Dementia Sister     Social History:  Widowed in 01/2013.  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 She reports that she has never smoked. She does not have any smokeless tobacco history on file. She reports that she does not drink alcohol or use illicit drugs.   Allergies  Allergen Reactions  . Codeine Rash  . Sulfa Antibiotics Rash   Medications Prior to Admission  Medication Sig Dispense Refill  . amLODipine (NORVASC) 2.5 MG tablet Take 2.5 mg by mouth daily.      . Calcium-Vitamin D (CALTRATE 600 PLUS-VIT D PO) Take 1 tablet by mouth daily.       . cholecalciferol (VITAMIN D) 1000 UNITS tablet Take 2,000 Units by mouth daily.      . DULoxetine (CYMBALTA) 30 MG capsule Take 30 mg by mouth daily.      Marland Kitchen estrogens, conjugated, (PREMARIN) 0.9 MG tablet Take 0.9 mg by mouth daily. Take daily for 21 days then do not take for 7 days.      . hydrochlorothiazide (MICROZIDE) 12.5 MG capsule Take 12.5 mg by mouth daily.      . meloxicam (MOBIC) 15 MG tablet Take 15 mg by mouth daily.      . Multiple Vitamins-Minerals (MULTIVITAMIN WITH MINERALS)  tablet Take 1 tablet by mouth daily.        Home: Home Living Family/patient expects to be discharged to:: Inpatient rehab Living Arrangements: Children Additional Comments: pt recently lost her husband.     Functional History: Prior Function Comments: pt states sometimes she has her son carry the laundry basket up/down the basement stairs.    Functional Status:  Mobility: min to mod assist     Ambulation/Gait Ambulation Distance (Feet): 70 Feet (x2) General Gait Details: pt continues with R knee instability and decreased DF on R.  cueing for increased heel strike and step length on R side.      ADL:    Cognition: Cognition Overall Cognitive Status:  Within Functional Limits for tasks assessed Orientation Level: Oriented X4 Cognition Arousal/Alertness: Awake/alert Behavior During Therapy: WFL for tasks assessed/performed Overall Cognitive Status: Within Functional Limits for tasks assessed  Physical Exam: Blood pressure 115/72, pulse 86, temperature 98.6 F (37 C), temperature source Oral, resp. rate 20, height 4' 11" (1.499 m), weight 54.432 kg (120 lb), SpO2 96.00%. Physical Exam Vitals reviewed.  Constitutional: She is oriented to person, place, and time. She appears well-developed.  HENT: oral mucosa pink and moist. Poor dentition Head: Normocephalic.  Eyes: EOM are normal.  Neck: Normal range of motion. Neck supple. No thyromegaly present.  Cardiovascular: Normal rate and regular rhythm. No murmurs, rubs, or gallops Respiratory: Effort normal and breath sounds normal. No respiratory distress. No wheezes, rales, or rhonchi GI: Soft. Bowel sounds are normal. She exhibits no distension.  Skin: Skin is warm and dry.  Psychiatric: She has a normal mood and affect.  Neuro: right facial droop and tongue deviation, speech slightly dysarthric but fully intelligible. Vision intact. motor strength is 1+ to 2- in the right deltoid, bicep, tricep, grip, finger flexors and extensors  Right HF 2, KE 2+, Ankle 3- with DF/PF 5/5 in the left deltoid, bicep and tricep, grip, hip flexor, knee extension, ankle dorsiflexor and plantar flexor  Sensation reduced to light touch on the right side upper greater than lower extremity, (1/2) .  Fair insight and awareness. Followed all commands   Results for orders placed during the hospital encounter of 05/12/13 (from the past 48 hour(s))  CBG MONITORING, ED     Status: Abnormal   Collection Time    05/12/13  2:09 PM      Result Value Ref Range   Glucose-Capillary 115 (*) 70 - 99 mg/dL   Comment 1 Notify RN     Comment 2 Documented in Chart    URINE RAPID DRUG SCREEN (HOSP PERFORMED)     Status: None    Collection Time    05/12/13  2:15 PM      Result Value Ref Range   Opiates NONE DETECTED  NONE DETECTED   Cocaine NONE DETECTED  NONE DETECTED   Benzodiazepines NONE DETECTED  NONE DETECTED   Amphetamines NONE DETECTED  NONE DETECTED   Tetrahydrocannabinol NONE DETECTED  NONE DETECTED   Barbiturates NONE DETECTED  NONE DETECTED   Comment:            DRUG SCREEN FOR MEDICAL PURPOSES     ONLY.  IF CONFIRMATION IS NEEDED     FOR ANY PURPOSE, NOTIFY LAB     WITHIN 5 DAYS.                LOWEST DETECTABLE LIMITS     FOR URINE DRUG SCREEN     Drug Class  Cutoff (ng/mL)     Amphetamine      1000     Barbiturate      200     Benzodiazepine   283     Tricyclics       151     Opiates          300     Cocaine          300     THC              50  URINALYSIS, ROUTINE W REFLEX MICROSCOPIC     Status: Abnormal   Collection Time    05/12/13  2:15 PM      Result Value Ref Range   Color, Urine YELLOW  YELLOW   APPearance CLEAR  CLEAR   Specific Gravity, Urine 1.014  1.005 - 1.030   pH 7.0  5.0 - 8.0   Glucose, UA NEGATIVE  NEGATIVE mg/dL   Hgb urine dipstick SMALL (*) NEGATIVE   Bilirubin Urine NEGATIVE  NEGATIVE   Ketones, ur NEGATIVE  NEGATIVE mg/dL   Protein, ur NEGATIVE  NEGATIVE mg/dL   Urobilinogen, UA 1.0  0.0 - 1.0 mg/dL   Nitrite NEGATIVE  NEGATIVE   Leukocytes, UA MODERATE (*) NEGATIVE  URINE MICROSCOPIC-ADD ON     Status: Abnormal   Collection Time    05/12/13  2:15 PM      Result Value Ref Range   Squamous Epithelial / LPF RARE  RARE   WBC, UA 7-10  <3 WBC/hpf   RBC / HPF 3-6  <3 RBC/hpf   Bacteria, UA FEW (*) RARE  CREATININE, SERUM     Status: Abnormal   Collection Time    05/12/13  8:00 PM      Result Value Ref Range   Creatinine, Ser 0.89  0.50 - 1.10 mg/dL   GFR calc non Af Amer 61 (*) >90 mL/min   GFR calc Af Amer 71 (*) >90 mL/min   Comment: (NOTE)     The eGFR has been calculated using the CKD EPI equation.     This calculation has not been  validated in all clinical situations.     eGFR's persistently <90 mL/min signify possible Chronic Kidney     Disease.  URINE CULTURE     Status: None   Collection Time    05/12/13 11:30 PM      Result Value Ref Range   Specimen Description URINE, RANDOM     Special Requests NONE     Culture  Setup Time       Value: 05/12/2013 23:48     Performed at [facility]       Value: NO GROWTH     Performed at [facility]   Culture       Value: NO GROWTH     Performed at [facility]   Report Status 05/14/2013 FINAL    HEMOGLOBIN A1C     Status: Abnormal   Collection Time    05/13/13  4:40 AM      Result Value Ref Range   Hemoglobin A1C 5.8 (*) <5.7 %   Comment: (NOTE)  According to the ADA Clinical Practice Recommendations for 2011, when     HbA1c is used as a screening test:      >=6.5%   Diagnostic of Diabetes Mellitus               (if abnormal result is confirmed)     5.7-6.4%   Increased risk of developing Diabetes Mellitus     References:Diagnosis and Classification of Diabetes Mellitus,Diabetes     Care,2011,34(Suppl 1):S62-S69 and Standards of Medical Care in             Diabetes - 2011,Diabetes Care,2011,34 (Suppl 1):S11-S61.   Mean Plasma Glucose 120 (*) <117 mg/dL   Comment: Performed at [facility]  LIPID PANEL     Status: Abnormal   Collection Time    05/13/13  4:40 AM      Result Value Ref Range   Cholesterol 259 (*) 0 - 200 mg/dL   Triglycerides 178 (*) <150 mg/dL   HDL 75  >39 mg/dL   Total CHOL/HDL Ratio 3.5     VLDL 36  0 - 40 mg/dL   LDL Cholesterol 148 (*) 0 - 99 mg/dL   Comment:            Total Cholesterol/HDL:CHD Risk     Coronary Heart Disease Risk Table                         Men   Women      1/2 Average Risk   3.4   3.3      Average Risk       5.0   4.4      2 X Average Risk   9.6   7.1      3 X Average Risk  23.4   11.0                  Use the calculated Patient Ratio     above and the CHD Risk Table     to determine the patient's CHD Risk.                ATP III CLASSIFICATION (LDL):      <100     mg/dL   Optimal      100-129  mg/dL   Near or Above                        Optimal      130-159  mg/dL   Borderline      160-189  mg/dL   High      >190     mg/dL   Very High   Dg Chest 2 View  05/13/2013   CLINICAL DATA:  Stroke.  EXAM: CHEST  2 VIEW  COMPARISON:  None available for comparison at time of study interpretation.  FINDINGS: The cardiac silhouette is mildly enlarged, mediastinal silhouette is nonsuspicious, mildly calcified aortic knob. Increased lung volumes with chronic interstitial changes. Biapical calcified pleural plaque. Linear density left lung base. No pleural effusions. No pneumothorax.  Widened right acromioclavicular joint may reflect remote injury. Multiple EKG lines overlie the patient and may obscure subtle underlying pathology. Moderate degenerative change of the lumbar spine with thoracolumbar levoscoliosis.  IMPRESSION: Mild cardiomegaly and COPD. Linear densities in left lung base may reflect atelectasis or scarring.   Electronically Signed   By: Courtnay  Bloomer   On: 05/13/2013 06:04   Ct Head   Wo Contrast  05/12/2013   CLINICAL DATA:  Right arm weakness since Friday  EXAM: CT HEAD WITHOUT CONTRAST  TECHNIQUE: Contiguous axial images were obtained from the base of the skull through the vertex without intravenous contrast.  COMPARISON:  None.  FINDINGS: Moderate diffuse atrophy. Severe bilateral low attenuation in the deep periventricular white matter. There is no vascular territory infarct. There is no hemorrhage or extra-axial fluid. There is no mass effect or hydrocephalus. In the left corona radiata above the level of the ventricles and the parietal lobe there is a 13 mm oval focus of low attenuation.  IMPRESSION: Significant chronic involutional change with evidence of chronic ischemia  in the white matter.  More focal oval area of low-attenuation left parietal lobe deep white matter above the level of the ventricles. This could represent asymmetric ischemic change, but the possibility of subacute lacunar infarction is not excluded. Consider MRI if indicated to better evaluate.   Electronically Signed   By: Raymond  Rubner M.D.   On: 05/12/2013 13:56   Mr Brain Wo Contrast  05/14/2013   ADDENDUM REPORT: 05/14/2013 02:10  ADDENDUM: Acute findings discussed with and reconfirmed by Kathy Shorr, [facility] on May 14, 2013 at 2:10 a.m.   Electronically Signed   By: Courtnay  Bloomer   On: 05/14/2013 02:10   05/14/2013   CLINICAL DATA:  Port to 5 days of right sided weakness, predominantly right upper extremity hemi paresis.  EXAM: MRI HEAD WITHOUT CONTRAST  MRA HEAD WITHOUT CONTRAST  TECHNIQUE: Multiplanar, multiecho pulse sequences of the brain and surrounding structures were obtained without intravenous contrast. Angiographic images of the head were obtained using MRA technique without contrast.  COMPARISON:  CT HEAD W/O CM dated 05/12/2013  FINDINGS: MRI HEAD FINDINGS  14 mm focus of reduced diffusion within the left posterior frontal lobe, precentral gyrus with corresponding low ADC values. No susceptibility artifact to suggest hemorrhage. However, there are a few scattered subcentimeter focus of susceptibility artifact predominantly central in distribution.  Moderate ventriculomegaly, likely on the basis of global parenchymal brain volume loss. Patchy to come: Predominately supratentorial T2 hyperintensities is seen, with mild patchy pontine T2 hyperintensities. Sub cm remote right cerebellar infarct in addition to patchy T2 hyperintensities within the basal ganglia and thalamus suggesting lacunar infarcts. No midline shift or mass effect.  No abnormal extra-axial fluid collections. Status post bilateral ocular lens implants. No paranasal sinus air-fluid levels, mastoid air cells are well  aerated. Severe left temporomandibular osteoarthrosis with small joint effusion. No abnormal sellar expansion. No cerebellar tonsillar ectopia. Somewhat bright T1 bone marrow signal may reflect osteopenia. There is mild sulcal effacement of the convexities, as seen on prior CT.  MRA HEAD FINDINGS  Anterior circulation: Normal flow related enhancement of the included cervical internal carotid arteries, as well as the petrous, cavernous and supra clinoid internal carotid arteries, mildly filled color ectatic intracranial vessels. Normal appearance of the anterior and middle cerebral arteries.  Posterior circulation: Left vertebral artery is mildly dominant with normal flow related enhancement of the vertebral arteries, vertebrobasilar junction, basilar artery, with a local ectasia. Tiny posterior communicating arteries tube are present. Normal flow early enhancement posterior cerebral arteries. Very slight irregularity of the posterior cerebral arteries.  No large vessel occlusion, hemodynamically significant stenosis, aneurysm, suspicious luminal irregularity.  IMPRESSION: MRI head: Acute 14 mm infarct within the left posterior frontal lobe, precentral gyrus (motor cortex) without hemorrhagic conversion.  Moderate ventriculomegaly, which may be predominately on the basis of brain atrophy, however   there is mild sulcal effacement at the convexities which can be seen with normal pressure hydrocephalus.  Moderate to severe white matter changes most consistent with chronic small vessel ischemic disease with patchy bright T2 signal within the basal ganglia and thalamus likely reflecting lacunar infarcts. Scattered foci of susceptibility artifact suggest chronic hypertension.  MRA head: No hemodynamically significant stenosis. Dolichoectasia, which can be seen with chronic hypertension.  Minimal irregularity of the posterior cerebral artery suggest intracranial atherosclerosis.  Electronically Signed: By: Elon Alas  On: 05/14/2013 02:05    Post Admission Physician Evaluation: 1. Functional deficits secondary  to left frontal lobe infarct. 2. Patient is admitted to receive collaborative, interdisciplinary care between the physiatrist, rehab nursing staff, and therapy team. 3. Patient's level of medical complexity and substantial therapy needs in context of that medical necessity cannot be provided at a lesser intensity of care such as a SNF. 4. Patient has experienced substantial functional loss from his/her baseline which was documented above under the "Functional History" and "Functional Status" headings.  Judging by the patient's diagnosis, physical exam, and functional history, the patient has potential for functional progress which will result in measurable gains while on inpatient rehab.  These gains will be of substantial and practical use upon discharge  in facilitating mobility and self-care at the household level. 5. Physiatrist will provide 24 hour management of medical needs as well as oversight of the therapy plan/treatment and provide guidance as appropriate regarding the interaction of the two. 6. 24 hour rehab nursing will assist with bladder management, bowel management, safety, skin/wound care, disease management, medication administration, pain management and patient education  and help integrate therapy concepts, techniques,education, etc. 7. PT will assess and treat for/with: Lower extremity strength, range of motion, stamina, balance, functional mobility, safety, adaptive techniques and equipment, NMR,   perceptual awareness.   Goals are: supervision . 8. OT will assess and treat for/with: ADL's, functional mobility, safety, upper extremity strength, adaptive techniques and equipment, NMR,   perceptual awareness, education.   Goals are: supervision to min assist. 9. SLP will assess and treat for/with: speech, cognition.  Goals are: mod I to supervision. 10. Case Management and Social Worker  will assess and treat for psychological issues and discharge planning. 11. Team conference will be held weekly to assess progress toward goals and to determine barriers to discharge. 12. Patient will receive at least 3 hours of therapy per day at least 5 days per week. 13. ELOS: 14-19 days       14. Prognosis:  excellent   Medical Problem List and Plan: 1. DVT Prophylaxis/Anticoagulation: Pharmaceutical: Lovenox 2. Pain Management:  Has used  meloxicam and cymbalta in the past had stopped taking meds since Husband's death. Willing to resume cymbalta as that has helped with neuropathy in the past.  Will use tylenol prn. Add voltaren gel for shoulder pain.  3. Mood:  Denies depressed mood. LCSW to follow for evaluation.  4. Neuropsych: This patient is capable of making decisions on her own behalf. 5. HTN:  Will monitor with bid checks. Monitor for orthostasis.  6. Dyslipidemia: Add zocor.    Meredith Staggers, MD, [facility]   05/14/2013

## 2013-05-14 NOTE — Plan of Care (Signed)
Pt transferred to [id number] via wheelchair. All belongings sent with pt. Pt is stable.

## 2013-05-14 NOTE — Interval H&P Note (Signed)
Kathy Calhoun was admitted today to Inpatient Rehabilitation with the diagnosis of left frontal lobe infact.  The patient's history has been reviewed, patient examined, and there is no change in status.  Patient continues to be appropriate for intensive inpatient rehabilitation.  I have reviewed the patient's chart and labs.  Questions were answered to the patient's satisfaction.  [name] T 05/14/2013, 9:15 PM

## 2013-05-14 NOTE — Progress Notes (Addendum)
Pt. Arrived to Rehab unit room 4M09 at 1800.  Report received from May, RN on [facility].  VSS, patient has pleasant demeanour and AOX3.  Denies pain.  Oriented to rehab unit, call bell in reach and bed alarm set.

## 2013-05-14 NOTE — Progress Notes (Signed)
Rehab admissions - I met with pt and answered questions about our inpatient rehab program. Informational brochures given and spoke with son Tim by phone as well. Spoke with Dr. Conley Canal who stated that pt is medically stable for inpatient rehab. Bed is available today and will admit pt to inpatient rehab later today. Updated Loma Sousa, case manager as well.   Please call me with any questions. Thanks.  Nanetta Batty, PT Rehabilitation Admissions Coordinator [phone]

## 2013-05-14 NOTE — Progress Notes (Deleted)
Physician Discharge Summary  Kathy Calhoun [id number] DOB: April 02, 1933 DOA: 05/12/2013  PCP: Mathews Argyle, MD  Admit date: 05/12/2013 Discharge date: 05/14/2013  Time spent: greater than 30 minutes  Recommendations for Outpatient Follow-up:  1. Check LFTs in one month  Discharge Diagnoses:  Principal Problem:   CVA (cerebral infarction) Active Problems:   Essential hypertension, benign   Depression   Noncompliance  asymptomatic bacteriuria  Discharge Condition: stable  Disposition: to CIR  Filed Weights   05/12/13 1217  Weight: 54.432 kg (120 lb)    History of present illness:  80 y.o. female presenting with CVA. Per pt, she has had 4-5 days of R sided, but predominantly RUE hemiparesis. Pt has a baseline hx/o R shoulder pain s/p rotator cuff surgery several years ago. Pt states that she was trying to sew when she felt her arm fall asleep 4-5 days ago and this has persisted. Pt has also had some RLE weakness to the point that she is scared to walk. Pt denies any falls at home. Denies any prior hx/o stroke in the past. Non smoker. Non diabetic. Does report a prior hx/o HTN. Her BP medication was d/c'd by PCP last year secondary to falls. Pt does not remember name of medication. Pt not on ASA prior to admission. Had been off medications for several months after the death of her husband Pt was brought to [facility] ER for further evaluation by son. Was noted to have questionable L parietal lobe CVA on head CT. Not a candidate for TPA due to delay in presentation.  Hospital Course:  Patient was transferred to neuro telemetry unit at [facility]. MRI confirmed acute ischemic stroke. Patient was started on aspirin. LDL of 100 and was started on statin. Antihypertensives were resumed. Echocardiogram shows no source of embolus. Carotid Doppler showed no critical stenosis. Hemoglobin A1c 5.8. Patient has been evaluated by physical therapy occupational therapy and speech therapy. They  recommended inpatient rehabilitation and she has been accepted for transfer today. She has 3-4/5 strength in her right hand and leg at discharge.  Medically stable.  Urinalysis showed few bacteria, 7-10 white cells, moderate nitrite, small leukocyte esterase, but patient has no dysuria, fevers, chills. Was not treated for UTI but should symptoms develop, may need to start antibiotics.  Procedures:  None  Consultations:  Physical medicine and rehabilitation  Discharge Exam: Filed Vitals:   05/14/13 1416  BP: 155/73  Pulse: 85  Temp: 97.8 F (36.6 C)  Resp: 20    General: Alert, oriented Cardiovascular: Regular rate rhythm  Respiratory: Clear to auscultation bilaterally Neuro: Cranial nerves intact. Speech clear and fluent. Right arm and leg strength 3-4/5 Psychiatric: Normal affect  Discharge Instructions  Discharge Orders   Future Orders Complete By Expires   Diet - low sodium heart healthy  As directed    Walk with assistance  As directed        Medication List    STOP taking these medications       estrogens (conjugated) 0.9 MG tablet  Commonly known as:  PREMARIN     meloxicam 15 MG tablet  Commonly known as:  MOBIC      TAKE these medications       amLODipine 2.5 MG tablet  Commonly known as:  NORVASC  Take 2.5 mg by mouth daily.     aspirin 325 MG tablet  Take 1 tablet (325 mg total) by mouth daily.     atorvastatin 20 MG tablet  Commonly  known as:  LIPITOR  Take 1 tablet (20 mg total) by mouth daily.     CALTRATE 600 PLUS-VIT D PO  Take 1 tablet by mouth daily.     cholecalciferol 1000 UNITS tablet  Commonly known as:  VITAMIN D  Take 2,000 Units by mouth daily.     DULoxetine 30 MG capsule  Commonly known as:  CYMBALTA  Take 30 mg by mouth daily.     hydrochlorothiazide 12.5 MG capsule  Commonly known as:  MICROZIDE  Take 12.5 mg by mouth daily.     multivitamin with minerals tablet  Take 1 tablet by mouth daily.       Allergies   Allergen Reactions  . Codeine Rash  . Sulfa Antibiotics Rash      The results of significant diagnostics from this hospitalization (including imaging, microbiology, ancillary and laboratory) are listed below for reference.    Significant Diagnostic Studies: Dg Chest 2 View  05/13/2013   CLINICAL DATA:  Stroke.  EXAM: CHEST  2 VIEW  COMPARISON:  None available for comparison at time of study interpretation.  FINDINGS: The cardiac silhouette is mildly enlarged, mediastinal silhouette is nonsuspicious, mildly calcified aortic knob. Increased lung volumes with chronic interstitial changes. Biapical calcified pleural plaque. Linear density left lung base. No pleural effusions. No pneumothorax.  Widened right acromioclavicular joint may reflect remote injury. Multiple EKG lines overlie the patient and may obscure subtle underlying pathology. Moderate degenerative change of the lumbar spine with thoracolumbar levoscoliosis.  IMPRESSION: Mild cardiomegaly and COPD. Linear densities in left lung base may reflect atelectasis or scarring.   Electronically Signed   By: Elon Alas   On: 05/13/2013 06:04   Ct Head Wo Contrast  05/12/2013   CLINICAL DATA:  Right arm weakness since Friday  EXAM: CT HEAD WITHOUT CONTRAST  TECHNIQUE: Contiguous axial images were obtained from the base of the skull through the vertex without intravenous contrast.  COMPARISON:  None.  FINDINGS: Moderate diffuse atrophy. Severe bilateral low attenuation in the deep periventricular white matter. There is no vascular territory infarct. There is no hemorrhage or extra-axial fluid. There is no mass effect or hydrocephalus. In the left corona radiata above the level of the ventricles and the parietal lobe there is a 13 mm oval focus of low attenuation.  IMPRESSION: Significant chronic involutional change with evidence of chronic ischemia in the white matter.  More focal oval area of low-attenuation left parietal lobe deep white matter  above the level of the ventricles. This could represent asymmetric ischemic change, but the possibility of subacute lacunar infarction is not excluded. Consider MRI if indicated to better evaluate.   Electronically Signed   By: Skipper Cliche M.D.   On: 05/12/2013 13:56   Mr Brain Wo Contrast  05/14/2013   ADDENDUM REPORT: 05/14/2013 02:10  ADDENDUM: Acute findings discussed with and reconfirmed by Derrell Lolling, [facility] on May 14, 2013 at 2:10 a.m.   Electronically Signed   By: Elon Alas   On: 05/14/2013 02:10   05/14/2013   CLINICAL DATA:  Port to 5 days of right sided weakness, predominantly right upper extremity hemi paresis.  EXAM: MRI HEAD WITHOUT CONTRAST  MRA HEAD WITHOUT CONTRAST  TECHNIQUE: Multiplanar, multiecho pulse sequences of the brain and surrounding structures were obtained without intravenous contrast. Angiographic images of the head were obtained using MRA technique without contrast.  COMPARISON:  CT HEAD W/O CM dated 05/12/2013  FINDINGS: MRI HEAD FINDINGS  14 mm focus of  artifact to suggest hemorrhage. However, there are a few scattered subcentimeter focus of susceptibility artifact predominantly central in distribution. Moderate ventriculomegaly, likely on the basis of global parenchymal brain volume loss. Patchy to come: Predominately supratentorial T2 hyperintensities is seen, with mild patchy pontine T2 hyperintensities. Sub cm remote right cerebellar infarct in addition to patchy T2 hyperintensities within the basal ganglia and thalamus suggesting lacunar infarcts. No midline shift or mass effect. No abnormal extra-axial fluid collections. Status post bilateral ocular lens implants. No paranasal sinus air-fluid levels, mastoid air cells are well aerated. Severe left temporomandibular osteoarthrosis with small joint effusion. No abnormal sellar expansion. No cerebellar tonsillar ectopia.  Somewhat bright T1 bone marrow signal may reflect osteopenia. There is mild sulcal effacement of the convexities, as seen on prior CT. MRA HEAD FINDINGS Anterior circulation: Normal flow related enhancement of the included cervical internal carotid arteries, as well as the petrous, cavernous and supra clinoid internal carotid arteries, mildly filled color ectatic intracranial vessels. Normal appearance of the anterior and middle cerebral arteries. Posterior circulation: Left vertebral artery is mildly dominant with normal flow related enhancement of the vertebral arteries, vertebrobasilar junction, basilar artery, with a local ectasia. Tiny posterior communicating arteries tube are present. Normal flow early enhancement posterior cerebral arteries. Very slight irregularity of the posterior cerebral arteries. No large vessel occlusion, hemodynamically significant stenosis, aneurysm, suspicious luminal irregularity. IMPRESSION: MRI head: Acute 14 mm infarct within the left posterior frontal lobe, precentral gyrus (motor cortex) without hemorrhagic conversion. Moderate ventriculomegaly, which may be predominately on the basis of brain atrophy, however there is mild sulcal effacement at the convexities which can be seen with normal pressure hydrocephalus. Moderate to severe white matter changes most consistent with chronic small vessel ischemic disease with patchy bright T2 signal within the basal ganglia and thalamus likely reflecting lacunar infarcts. Scattered foci of susceptibility artifact suggest chronic hypertension. MRA head: No hemodynamically significant stenosis. Dolichoectasia, which can be seen with chronic hypertension. Minimal irregularity of the posterior cerebral artery suggest intracranial atherosclerosis. Electronically Signed: By: Courtnay Bloomer On: 05/14/2013 02:05  Mr Mra Head/brain Wo Cm  05/14/2013 ADDENDUM REPORT: 05/14/2013 02:10 ADDENDUM: Acute findings discussed with and reconfirmed by  Kathy Shorr, [facility] on May 14, 2013 at 2:10 a.m. Electronically Signed By: Courtnay Bloomer On: 05/14/2013 02:10  05/14/2013 CLINICAL DATA: Port to 5 days of right sided weakness, predominantly right upper extremity hemi paresis. EXAM: MRI HEAD WITHOUT CONTRAST MRA HEAD WITHOUT CONTRAST TECHNIQUE: Multiplanar, multiecho pulse sequences of the brain and surrounding structures were obtained without intravenous contrast. Angiographic images of the head were obtained using MRA technique without contrast. COMPARISON: CT HEAD W/O CM dated 05/12/2013 FINDINGS: MRI HEAD FINDINGS 14 mm focus of reduced diffusion within the left posterior frontal lobe, precentral gyrus with corresponding low ADC values. No susceptibility artifact to suggest hemorrhage. However, there are a few scattered subcentimeter focus of susceptibility artifact predominantly central in distribution. Moderate ventriculomegaly, likely on the basis of global parenchymal brain volume loss. Patchy to come: Predominately supratentorial T2 hyperintensities is seen, with mild patchy pontine T2 hyperintensities. Sub cm remote right cerebellar infarct in addition to patchy T2 hyperintensities within the basal ganglia and thalamus suggesting lacunar infarcts. No midline shift or mass effect. No abnormal extra-axial fluid collections. Status post bilateral ocular lens implants. No paranasal sinus air-fluid levels, mastoid air cells are well aerated. Severe left temporomandibular osteoarthrosis with small joint effusion. No abnormal sellar expansion. No cerebellar tonsillar ectopia. Somewhat bright T1 bone marrow signal may reflect osteopenia.   No midline shift or mass effect.  No abnormal extra-axial fluid collections. Status post bilateral ocular lens implants. No paranasal sinus air-fluid levels, mastoid air cells are well aerated. Severe left temporomandibular osteoarthrosis with small joint effusion. No abnormal sellar expansion. No cerebellar tonsillar ectopia. Somewhat bright T1 bone marrow signal may reflect osteopenia. There is mild sulcal effacement of the convexities, as seen on prior CT.  MRA HEAD FINDINGS  Anterior circulation: Normal flow related enhancement of the included cervical internal carotid arteries, as well as the petrous, cavernous and  supra clinoid internal carotid arteries, mildly filled color ectatic intracranial vessels. Normal appearance of the anterior and middle cerebral arteries.  Posterior circulation: Left vertebral artery is mildly dominant with normal flow related enhancement of the vertebral arteries, vertebrobasilar junction, basilar artery, with a local ectasia. Tiny posterior communicating arteries tube are present. Normal flow early enhancement posterior cerebral arteries. Very slight irregularity of the posterior cerebral arteries.  No large vessel occlusion, hemodynamically significant stenosis, aneurysm, suspicious luminal irregularity.  IMPRESSION: MRI head: Acute 14 mm infarct within the left posterior frontal lobe, precentral gyrus (motor cortex) without hemorrhagic conversion.  Moderate ventriculomegaly, which may be predominately on the basis of brain atrophy, however there is mild sulcal effacement at the convexities which can be seen with normal pressure hydrocephalus.  Moderate to severe white matter changes most consistent with chronic small vessel ischemic disease with patchy bright T2 signal within the basal ganglia and thalamus likely reflecting lacunar infarcts. Scattered foci of susceptibility artifact suggest chronic hypertension.  MRA head: No hemodynamically significant stenosis. Dolichoectasia, which can be seen with chronic hypertension.  Minimal irregularity of the posterior cerebral artery suggest intracranial atherosclerosis.  Electronically Signed: By: Elon Alas On: 05/14/2013 02:05   Echocardiogram, Left ventricle: The cavity size was normal. Systolic function was normal. The estimated ejection fraction was 55%. Images were inadequate for LV wall motion assessment. There was an increased relative contribution of atrial contraction to ventricular filling. Doppler parameters are consistent with abnormal left ventricular relaxation (grade 1 diastolic dysfunction). - Aortic valve: The AV is  probably trileaflet with 1 cusp heavily calcified and immobile but cannot rule out bicuspid valve from this study A bicuspid morphology cannot be excluded. Moderate diffuse thickening and calcification. - Pulmonary arteries: PA peak pressure: 30mm Hg (S).  Carotid Dopplers Findings suggest 1-39% internal carotid artery stenosis bilaterally. Vertebral arteries are patent with antegrade flow.    Microbiology: Recent Results (from the past 240 hour(s))  URINE CULTURE     Status: None   Collection Time    05/12/13 11:30 PM      Result Value Ref Range Status   Specimen Description URINE, RANDOM   Final   Special Requests NONE   Final   Culture  Setup Time     Final   Value: 05/12/2013 23:48     Performed at [place]     Final   Value: NO GROWTH     Performed at [facility]   Culture     Final   Value: NO GROWTH     Performed at [facility]   Report Status 05/14/2013 FINAL   Final     Labs: Basic Metabolic Panel:  Recent Labs Lab 05/12/13 1320 05/12/13 2000  NA 143  --   K 3.8  --   CL 102  --   CO2 26  --   GLUCOSE 115*  --   BUN 19  --  CREATININE 0.80 0.89  CALCIUM 10.2  --    Liver Function Tests:  Recent Labs Lab 05/12/13 1320  AST 20  ALT 15  ALKPHOS 84  BILITOT 0.4  PROT 7.8  ALBUMIN 4.0   No results found for this basename: LIPASE, AMYLASE,  in the last 168 hours No results found for this basename: AMMONIA,  in the last 168 hours CBC:  Recent Labs Lab 05/12/13 1320  WBC 6.4  NEUTROABS 3.0  HGB 14.2  HCT 42.6  MCV 93.6  PLT 290   Cardiac Enzymes:  Recent Labs Lab 05/12/13 1320  TROPONINI <0.30   BNP: BNP (last 3 results) No results found for this basename: PROBNP,  in the last 8760 hours CBG:  Recent Labs Lab 05/12/13 1409  GLUCAP 115*       Signed:  McCreary L  [facility] 05/14/2013, 2:39 PM

## 2013-05-14 NOTE — H&P (Signed)
Physical Medicine and Rehabilitation Admission H&P    Chief Complaint  Patient presents with  . Right sided weakness with difficulty walking   HPI:  Kathy Calhoun is a 80 y.o. right-handed female with history of hypertension--"no BP medication due to falls", chronic LBP and R-RTC problems with RUE weakness.  Admitted 05/12/2013 with 4-5 day history of right-sided weakness, right facial numbness and difficulty walking. CT of the head showed focal oval area of low attenuation left parietal lobe deep white matter above the level of the ventricles. MRI showed acute infarct within the left posterior frontal lobe, precentral gyrus. MRA of the head without stenosis.  Echocardiogram with ejection fraction 50% grade 1 diastolic dysfunction. Carotid Dopplers without  ICA stenosis. Patient placed on ASA for secondary stroke prevention. Therapy initiated and CIR recommended by rehab team.    Review of Systems  HENT: Negative for hearing loss.   Eyes: Negative for blurred vision and double vision.  Respiratory: Negative for cough and shortness of breath.   Cardiovascular: Negative for chest pain and palpitations.  Musculoskeletal: Positive for back pain (multiple ESI--Dr. Jacelyn Grip) and joint pain (right shoulder). Negative for falls (None since 10/14 once BP meds d/c) and myalgias.  Neurological: Positive for speech change and focal weakness. Negative for dizziness and headaches.  Psychiatric/Behavioral: Negative for depression. The patient does not have insomnia.     Past Medical History  Diagnosis Date  . Osteoporosis   . HTN (hypertension)   . Disc     bulging lumbar disc has had steriod injections  x3   Past Surgical History  Procedure Laterality Date  . Cataract extraction      bilaterally 2011  . Appendectomy    . Tubal ligation    . Hysterotomy    . Oophorectomy    . Laparotomy      large benign mass removed 1993  . Nasal septum surgery     Family History  Problem Relation Age of  Onset  . Cancer Father   . Dementia Brother   . Dementia Sister     Social History:  Widowed in 01/2013.  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 She reports that she has never smoked. She does not have any smokeless tobacco history on file. She reports that she does not drink alcohol or use illicit drugs.   Allergies  Allergen Reactions  . Codeine Rash  . Sulfa Antibiotics Rash   Medications Prior to Admission  Medication Sig Dispense Refill  . amLODipine (NORVASC) 2.5 MG tablet Take 2.5 mg by mouth daily.      . Calcium-Vitamin D (CALTRATE 600 PLUS-VIT D PO) Take 1 tablet by mouth daily.       . cholecalciferol (VITAMIN D) 1000 UNITS tablet Take 2,000 Units by mouth daily.      . DULoxetine (CYMBALTA) 30 MG capsule Take 30 mg by mouth daily.      Marland Kitchen estrogens, conjugated, (PREMARIN) 0.9 MG tablet Take 0.9 mg by mouth daily. Take daily for 21 days then do not take for 7 days.      . hydrochlorothiazide (MICROZIDE) 12.5 MG capsule Take 12.5 mg by mouth daily.      . meloxicam (MOBIC) 15 MG tablet Take 15 mg by mouth daily.      . Multiple Vitamins-Minerals (MULTIVITAMIN WITH MINERALS)  tablet Take 1 tablet by mouth daily.        Home: Home Living Family/patient expects to be discharged to:: Inpatient rehab Living Arrangements: Children Additional Comments: pt recently lost her husband.     Functional History: Prior Function Comments: pt states sometimes she has her son carry the laundry basket up/down the basement stairs.    Functional Status:  Mobility: min to mod assist     Ambulation/Gait Ambulation Distance (Feet): 70 Feet (x2) General Gait Details: pt continues with R knee instability and decreased DF on R.  cueing for increased heel strike and step length on R side.      ADL:    Cognition: Cognition Overall Cognitive Status:  Within Functional Limits for tasks assessed Orientation Level: Oriented X4 Cognition Arousal/Alertness: Awake/alert Behavior During Therapy: WFL for tasks assessed/performed Overall Cognitive Status: Within Functional Limits for tasks assessed  Physical Exam: Blood pressure 115/72, pulse 86, temperature 98.6 F (37 C), temperature source Oral, resp. rate 20, height 4' 11" (1.499 m), weight 54.432 kg (120 lb), SpO2 96.00%. Physical Exam Vitals reviewed.  Constitutional: She is oriented to person, place, and time. She appears well-developed.  HENT: oral mucosa pink and moist. Poor dentition Head: Normocephalic.  Eyes: EOM are normal.  Neck: Normal range of motion. Neck supple. No thyromegaly present.  Cardiovascular: Normal rate and regular rhythm. No murmurs, rubs, or gallops Respiratory: Effort normal and breath sounds normal. No respiratory distress. No wheezes, rales, or rhonchi GI: Soft. Bowel sounds are normal. She exhibits no distension.  Skin: Skin is warm and dry.  Psychiatric: She has a normal mood and affect.  Neuro: right facial droop and tongue deviation, speech slightly dysarthric but fully intelligible. Vision intact. motor strength is 1+ to 2- in the right deltoid, bicep, tricep, grip, finger flexors and extensors  Right HF 2, KE 2+, Ankle 3- with DF/PF 5/5 in the left deltoid, bicep and tricep, grip, hip flexor, knee extension, ankle dorsiflexor and plantar flexor  Sensation reduced to light touch on the right side upper greater than lower extremity, (1/2) .  Fair insight and awareness. Followed all commands   Results for orders placed during the hospital encounter of 05/12/13 (from the past 48 hour(s))  CBG MONITORING, ED     Status: Abnormal   Collection Time    05/12/13  2:09 PM      Result Value Ref Range   Glucose-Capillary 115 (*) 70 - 99 mg/dL   Comment 1 Notify RN     Comment 2 Documented in Chart    URINE RAPID DRUG SCREEN (HOSP PERFORMED)     Status: None    Collection Time    05/12/13  2:15 PM      Result Value Ref Range   Opiates NONE DETECTED  NONE DETECTED   Cocaine NONE DETECTED  NONE DETECTED   Benzodiazepines NONE DETECTED  NONE DETECTED   Amphetamines NONE DETECTED  NONE DETECTED   Tetrahydrocannabinol NONE DETECTED  NONE DETECTED   Barbiturates NONE DETECTED  NONE DETECTED   Comment:            DRUG SCREEN FOR MEDICAL PURPOSES     ONLY.  IF CONFIRMATION IS NEEDED     FOR ANY PURPOSE, NOTIFY LAB     WITHIN 5 DAYS.                LOWEST DETECTABLE LIMITS     FOR URINE DRUG SCREEN     Drug Class  Cutoff (ng/mL)     Amphetamine      1000     Barbiturate      200     Benzodiazepine   283     Tricyclics       151     Opiates          300     Cocaine          300     THC              50  URINALYSIS, ROUTINE W REFLEX MICROSCOPIC     Status: Abnormal   Collection Time    05/12/13  2:15 PM      Result Value Ref Range   Color, Urine YELLOW  YELLOW   APPearance CLEAR  CLEAR   Specific Gravity, Urine 1.014  1.005 - 1.030   pH 7.0  5.0 - 8.0   Glucose, UA NEGATIVE  NEGATIVE mg/dL   Hgb urine dipstick SMALL (*) NEGATIVE   Bilirubin Urine NEGATIVE  NEGATIVE   Ketones, ur NEGATIVE  NEGATIVE mg/dL   Protein, ur NEGATIVE  NEGATIVE mg/dL   Urobilinogen, UA 1.0  0.0 - 1.0 mg/dL   Nitrite NEGATIVE  NEGATIVE   Leukocytes, UA MODERATE (*) NEGATIVE  URINE MICROSCOPIC-ADD ON     Status: Abnormal   Collection Time    05/12/13  2:15 PM      Result Value Ref Range   Squamous Epithelial / LPF RARE  RARE   WBC, UA 7-10  <3 WBC/hpf   RBC / HPF 3-6  <3 RBC/hpf   Bacteria, UA FEW (*) RARE  CREATININE, SERUM     Status: Abnormal   Collection Time    05/12/13  8:00 PM      Result Value Ref Range   Creatinine, Ser 0.89  0.50 - 1.10 mg/dL   GFR calc non Af Amer 61 (*) >90 mL/min   GFR calc Af Amer 71 (*) >90 mL/min   Comment: (NOTE)     The eGFR has been calculated using the CKD EPI equation.     This calculation has not been  validated in all clinical situations.     eGFR's persistently <90 mL/min signify possible Chronic Kidney     Disease.  URINE CULTURE     Status: None   Collection Time    05/12/13 11:30 PM      Result Value Ref Range   Specimen Description URINE, RANDOM     Special Requests NONE     Culture  Setup Time       Value: 05/12/2013 23:48     Performed at [facility]       Value: NO GROWTH     Performed at [facility]   Culture       Value: NO GROWTH     Performed at [facility]   Report Status 05/14/2013 FINAL    HEMOGLOBIN A1C     Status: Abnormal   Collection Time    05/13/13  4:40 AM      Result Value Ref Range   Hemoglobin A1C 5.8 (*) <5.7 %   Comment: (NOTE)  According to the ADA Clinical Practice Recommendations for 2011, when     HbA1c is used as a screening test:      >=6.5%   Diagnostic of Diabetes Mellitus               (if abnormal result is confirmed)     5.7-6.4%   Increased risk of developing Diabetes Mellitus     References:Diagnosis and Classification of Diabetes Mellitus,Diabetes     UXNA,3557,32(KGURK 1):S62-S69 and Standards of Medical Care in             Diabetes - 2011,Diabetes YHCW,2376,28 (Suppl 1):S11-S61.   Mean Plasma Glucose 120 (*) <117 mg/dL   Comment: Performed at [facility]     Status: Abnormal   Collection Time    05/13/13  4:40 AM      Result Value Ref Range   Cholesterol 259 (*) 0 - 200 mg/dL   Triglycerides 178 (*) <150 mg/dL   HDL 75  >39 mg/dL   Total CHOL/HDL Ratio 3.5     VLDL 36  0 - 40 mg/dL   LDL Cholesterol 148 (*) 0 - 99 mg/dL   Comment:            Total Cholesterol/HDL:CHD Risk     Coronary Heart Disease Risk Table                         Men   Women      1/2 Average Risk   3.4   3.3      Average Risk       5.0   4.4      2 X Average Risk   9.6   7.1      3 X Average Risk  23.4   11.0                  Use the calculated Patient Ratio     above and the CHD Risk Table     to determine the patient's CHD Risk.                ATP III CLASSIFICATION (LDL):      <100     mg/dL   Optimal      100-129  mg/dL   Near or Above                        Optimal      130-159  mg/dL   Borderline      160-189  mg/dL   High      >190     mg/dL   Very High   Dg Chest 2 View  05/13/2013   CLINICAL DATA:  Stroke.  EXAM: CHEST  2 VIEW  COMPARISON:  None available for comparison at time of study interpretation.  FINDINGS: The cardiac silhouette is mildly enlarged, mediastinal silhouette is nonsuspicious, mildly calcified aortic knob. Increased lung volumes with chronic interstitial changes. Biapical calcified pleural plaque. Linear density left lung base. No pleural effusions. No pneumothorax.  Widened right acromioclavicular joint may reflect remote injury. Multiple EKG lines overlie the patient and may obscure subtle underlying pathology. Moderate degenerative change of the lumbar spine with thoracolumbar levoscoliosis.  IMPRESSION: Mild cardiomegaly and COPD. Linear densities in left lung base may reflect atelectasis or scarring.   Electronically Signed   By: Elon Alas   On: 05/13/2013 06:04   Ct Head  Wo Contrast  05/12/2013   CLINICAL DATA:  Right arm weakness since Friday  EXAM: CT HEAD WITHOUT CONTRAST  TECHNIQUE: Contiguous axial images were obtained from the base of the skull through the vertex without intravenous contrast.  COMPARISON:  None.  FINDINGS: Moderate diffuse atrophy. Severe bilateral low attenuation in the deep periventricular white matter. There is no vascular territory infarct. There is no hemorrhage or extra-axial fluid. There is no mass effect or hydrocephalus. In the left corona radiata above the level of the ventricles and the parietal lobe there is a 13 mm oval focus of low attenuation.  IMPRESSION: Significant chronic involutional change with evidence of chronic ischemia  in the white matter.  More focal oval area of low-attenuation left parietal lobe deep white matter above the level of the ventricles. This could represent asymmetric ischemic change, but the possibility of subacute lacunar infarction is not excluded. Consider MRI if indicated to better evaluate.   Electronically Signed   By: Skipper Cliche M.D.   On: 05/12/2013 13:56   Mr Brain Wo Contrast  05/14/2013   ADDENDUM REPORT: 05/14/2013 02:10  ADDENDUM: Acute findings discussed with and reconfirmed by Derrell Lolling, [facility] on May 14, 2013 at 2:10 a.m.   Electronically Signed   By: Elon Alas   On: 05/14/2013 02:10   05/14/2013   CLINICAL DATA:  Port to 5 days of right sided weakness, predominantly right upper extremity hemi paresis.  EXAM: MRI HEAD WITHOUT CONTRAST  MRA HEAD WITHOUT CONTRAST  TECHNIQUE: Multiplanar, multiecho pulse sequences of the brain and surrounding structures were obtained without intravenous contrast. Angiographic images of the head were obtained using MRA technique without contrast.  COMPARISON:  CT HEAD W/O CM dated 05/12/2013  FINDINGS: MRI HEAD FINDINGS  14 mm focus of reduced diffusion within the left posterior frontal lobe, precentral gyrus with corresponding low ADC values. No susceptibility artifact to suggest hemorrhage. However, there are a few scattered subcentimeter focus of susceptibility artifact predominantly central in distribution.  Moderate ventriculomegaly, likely on the basis of global parenchymal brain volume loss. Patchy to come: Predominately supratentorial T2 hyperintensities is seen, with mild patchy pontine T2 hyperintensities. Sub cm remote right cerebellar infarct in addition to patchy T2 hyperintensities within the basal ganglia and thalamus suggesting lacunar infarcts. No midline shift or mass effect.  No abnormal extra-axial fluid collections. Status post bilateral ocular lens implants. No paranasal sinus air-fluid levels, mastoid air cells are well  aerated. Severe left temporomandibular osteoarthrosis with small joint effusion. No abnormal sellar expansion. No cerebellar tonsillar ectopia. Somewhat bright T1 bone marrow signal may reflect osteopenia. There is mild sulcal effacement of the convexities, as seen on prior CT.  MRA HEAD FINDINGS  Anterior circulation: Normal flow related enhancement of the included cervical internal carotid arteries, as well as the petrous, cavernous and supra clinoid internal carotid arteries, mildly filled color ectatic intracranial vessels. Normal appearance of the anterior and middle cerebral arteries.  Posterior circulation: Left vertebral artery is mildly dominant with normal flow related enhancement of the vertebral arteries, vertebrobasilar junction, basilar artery, with a local ectasia. Tiny posterior communicating arteries tube are present. Normal flow early enhancement posterior cerebral arteries. Very slight irregularity of the posterior cerebral arteries.  No large vessel occlusion, hemodynamically significant stenosis, aneurysm, suspicious luminal irregularity.  IMPRESSION: MRI head: Acute 14 mm infarct within the left posterior frontal lobe, precentral gyrus (motor cortex) without hemorrhagic conversion.  Moderate ventriculomegaly, which may be predominately on the basis of brain atrophy, however  there is mild sulcal effacement at the convexities which can be seen with normal pressure hydrocephalus.  Moderate to severe white matter changes most consistent with chronic small vessel ischemic disease with patchy bright T2 signal within the basal ganglia and thalamus likely reflecting lacunar infarcts. Scattered foci of susceptibility artifact suggest chronic hypertension.  MRA head: No hemodynamically significant stenosis. Dolichoectasia, which can be seen with chronic hypertension.  Minimal irregularity of the posterior cerebral artery suggest intracranial atherosclerosis.  Electronically Signed: By: Elon Alas  On: 05/14/2013 02:05    Post Admission Physician Evaluation: 1. Functional deficits secondary  to left frontal lobe infarct. 2. Patient is admitted to receive collaborative, interdisciplinary care between the physiatrist, rehab nursing staff, and therapy team. 3. Patient's level of medical complexity and substantial therapy needs in context of that medical necessity cannot be provided at a lesser intensity of care such as a SNF. 4. Patient has experienced substantial functional loss from his/her baseline which was documented above under the "Functional History" and "Functional Status" headings.  Judging by the patient's diagnosis, physical exam, and functional history, the patient has potential for functional progress which will result in measurable gains while on inpatient rehab.  These gains will be of substantial and practical use upon discharge  in facilitating mobility and self-care at the household level. 5. Physiatrist will provide 24 hour management of medical needs as well as oversight of the therapy plan/treatment and provide guidance as appropriate regarding the interaction of the two. 6. 24 hour rehab nursing will assist with bladder management, bowel management, safety, skin/wound care, disease management, medication administration, pain management and patient education  and help integrate therapy concepts, techniques,education, etc. 7. PT will assess and treat for/with: Lower extremity strength, range of motion, stamina, balance, functional mobility, safety, adaptive techniques and equipment, NMR,   perceptual awareness.   Goals are: supervision . 8. OT will assess and treat for/with: ADL's, functional mobility, safety, upper extremity strength, adaptive techniques and equipment, NMR,   perceptual awareness, education.   Goals are: supervision to min assist. 9. SLP will assess and treat for/with: speech, cognition.  Goals are: mod I to supervision. 10. Case Management and Social Worker  will assess and treat for psychological issues and discharge planning. 11. Team conference will be held weekly to assess progress toward goals and to determine barriers to discharge. 12. Patient will receive at least 3 hours of therapy per day at least 5 days per week. 13. ELOS: 14-19 days       14. Prognosis:  excellent   Medical Problem List and Plan: 1. DVT Prophylaxis/Anticoagulation: Pharmaceutical: Lovenox 2. Pain Management:  Has used  meloxicam and cymbalta in the past had stopped taking meds since Husband's death. Willing to resume cymbalta as that has helped with neuropathy in the past.  Will use tylenol prn. Add voltaren gel for shoulder pain.  3. Mood:  Denies depressed mood. LCSW to follow for evaluation.  4. Neuropsych: This patient is capable of making decisions on her own behalf. 5. HTN:  Will monitor with bid checks. Monitor for orthostasis.  6. Dyslipidemia: Add zocor.    Meredith Staggers, MD, [facility]   05/14/2013

## 2013-05-15 ENCOUNTER — Inpatient Hospital Stay (HOSPITAL_COMMUNITY): Payer: Medicare Other | Admitting: Occupational Therapy

## 2013-05-15 ENCOUNTER — Inpatient Hospital Stay (HOSPITAL_COMMUNITY): Payer: Medicare Other | Admitting: Physical Therapy

## 2013-05-15 DIAGNOSIS — I6350 Cerebral infarction due to unspecified occlusion or stenosis of unspecified cerebral artery: Secondary | ICD-10-CM

## 2013-05-15 DIAGNOSIS — E785 Hyperlipidemia, unspecified: Secondary | ICD-10-CM | POA: Diagnosis present

## 2013-05-15 DIAGNOSIS — I10 Essential (primary) hypertension: Secondary | ICD-10-CM

## 2013-05-15 LAB — CBC WITH DIFFERENTIAL/PLATELET
BASOS ABS: 0 10*3/uL (ref 0.0–0.1)
BASOS PCT: 1 % (ref 0–1)
EOS ABS: 0.2 10*3/uL (ref 0.0–0.7)
EOS PCT: 3 % (ref 0–5)
HEMATOCRIT: 39.1 % (ref 36.0–46.0)
Hemoglobin: 13.4 g/dL (ref 12.0–15.0)
Lymphocytes Relative: 41 % (ref 12–46)
Lymphs Abs: 2.6 10*3/uL (ref 0.7–4.0)
MCH: 31.5 pg (ref 26.0–34.0)
MCHC: 34.3 g/dL (ref 30.0–36.0)
MCV: 92 fL (ref 78.0–100.0)
MONO ABS: 0.9 10*3/uL (ref 0.1–1.0)
Monocytes Relative: 14 % — ABNORMAL HIGH (ref 3–12)
Neutro Abs: 2.7 10*3/uL (ref 1.7–7.7)
Neutrophils Relative %: 42 % — ABNORMAL LOW (ref 43–77)
PLATELETS: 287 10*3/uL (ref 150–400)
RBC: 4.25 MIL/uL (ref 3.87–5.11)
RDW: 14 % (ref 11.5–15.5)
WBC: 6.4 10*3/uL (ref 4.0–10.5)

## 2013-05-15 LAB — COMPREHENSIVE METABOLIC PANEL
ALBUMIN: 3.3 g/dL — AB (ref 3.5–5.2)
ALT: 9 U/L (ref 0–35)
AST: 16 U/L (ref 0–37)
Alkaline Phosphatase: 79 U/L (ref 39–117)
BUN: 20 mg/dL (ref 6–23)
CALCIUM: 9.7 mg/dL (ref 8.4–10.5)
CO2: 25 meq/L (ref 19–32)
CREATININE: 0.81 mg/dL (ref 0.50–1.10)
Chloride: 98 mEq/L (ref 96–112)
GFR calc Af Amer: 79 mL/min — ABNORMAL LOW (ref >90)
GFR, EST NON AFRICAN AMERICAN: 68 mL/min — AB (ref >90)
Glucose, Bld: 96 mg/dL (ref 70–99)
Potassium: 4.5 mEq/L (ref 3.7–5.3)
SODIUM: 138 meq/L (ref 137–147)
Total Bilirubin: 0.2 mg/dL — ABNORMAL LOW (ref 0.3–1.2)
Total Protein: 6.8 g/dL (ref 6.0–8.3)

## 2013-05-15 NOTE — Progress Notes (Signed)
Physical Therapy Session Note  Patient Details  Name: Kathy Calhoun MRN: [id number] Date of Birth: December 09, 1933  Today's Date: 05/15/2013 Time: 0160-1093 Time Calculation (min): 29 min  Short Term Goals: Week 1:  PT Short Term Goal 1 (Week 1): = LTG secondary to short LOS  Skilled Therapeutic Interventions/Progress Updates:    Pt received seated on mat table in gym having just finished OT session. Session focused on dynamic standing balance. Pt performed Berg Balance Scale, scoring 43/56. See below for detailed findings. Performed gait x112' in controlled environment with min guard to min A to recover from LOB to R side secondary to pt becoming distracted by surroundings. Pt performed supine>sit with supervision, HOB flat, no rails. Therapist departed with pt supine in bed with 3 bed rails up, bed alarm on, and all needs within reach.   Therapy Documentation Precautions:  Precautions Precautions: Fall Precaution Comments: history of rotator cuff repair and low back pain Required Braces or Orthoses:  (sling issued for increased support to right upper extremity) Restrictions Weight Bearing Restrictions: No Pain: Pain Assessment Pain Assessment: 0-10 Pain Score: 8  Pain Type: Chronic pain Pain Location: Shoulder Pain Orientation: Right Pain Descriptors / Indicators: Aching Pain Onset: On-going Pain Intervention(s): Splinting;Repositioned;Other (Comment) (Donned GivMorh sling; pt declined medication) Multiple Pain Sites: No Locomotion : Ambulation Ambulation/Gait Assistance: 4: Min assist  Balance: Standardized Balance Assessment Standardized Balance Assessment: Berg Balance Test Berg Balance Test Sit to Stand: Able to stand without using hands and stabilize independently Standing Unsupported: Able to stand safely 2 minutes Sitting with Back Unsupported but Feet Supported on Floor or Stool: Able to sit safely and securely 2 minutes Stand to Sit: Sits safely with minimal use of  hands Transfers: Able to transfer safely, minor use of hands Standing Unsupported with Eyes Closed: Able to stand 10 seconds with supervision Standing Ubsupported with Feet Together: Able to place feet together independently and stand for 1 minute with supervision From Standing, Reach Forward with Outstretched Arm: Can reach forward >12 cm safely (5") (Requires supervision to reach >25 cm) From Standing Position, Pick up Object from Floor: Able to pick up shoe, needs supervision From Standing Position, Turn to Look Behind Over each Shoulder: Looks behind one side only/other side shows less weight shift (Weight shift to L>R) Turn 360 Degrees: Able to turn 360 degrees safely but slowly (7 seconds to L; 8 seconds to R) Standing Unsupported, Alternately Place Feet on Step/Stool: Able to complete 4 steps without aid or supervision (8 steps with supervision; 32 seconds) Standing Unsupported, One Foot in Front: Able to take small step independently and hold 30 seconds Standing on One Leg: Able to lift leg independently and hold equal to or more than 3 seconds Total Score: 43  See FIM for current functional status  Therapy/Group: Individual Therapy  [name], Malva Cogan 05/15/2013, 3:59 PM

## 2013-05-15 NOTE — Progress Notes (Signed)
Subjective/Complaints: Was warm overnite otherwise no issues R shoulder pain chronic Review of Systems - Negative except R side weak  Objective: Vital Signs: Blood pressure 136/75, pulse 69, temperature 98 F (36.7 C), temperature source Oral, resp. rate 17, height 4\' 11"  (1.499 m), weight 52.6 kg (115 lb 15.4 oz), SpO2 98.00%. Mr Brain Wo Contrast  05/14/2013   ADDENDUM REPORT: 05/14/2013 02:10  ADDENDUM: Acute findings discussed with and reconfirmed by Derrell Lolling, [facility] on May 14, 2013 at 2:10 a.m.   Electronically Signed   By: Elon Alas   On: 05/14/2013 02:10   05/14/2013   CLINICAL DATA:  Port to 5 days of right sided weakness, predominantly right upper extremity hemi paresis.  EXAM: MRI HEAD WITHOUT CONTRAST  MRA HEAD WITHOUT CONTRAST  TECHNIQUE: Multiplanar, multiecho pulse sequences of the brain and surrounding structures were obtained without intravenous contrast. Angiographic images of the head were obtained using MRA technique without contrast.  COMPARISON:  CT HEAD W/O CM dated 05/12/2013  FINDINGS: MRI HEAD FINDINGS  14 mm focus of reduced diffusion within the left posterior frontal lobe, precentral gyrus with corresponding low ADC values. No susceptibility artifact to suggest hemorrhage. However, there are a few scattered subcentimeter focus of susceptibility artifact predominantly central in distribution.  Moderate ventriculomegaly, likely on the basis of global parenchymal brain volume loss. Patchy to come: Predominately supratentorial T2 hyperintensities is seen, with mild patchy pontine T2 hyperintensities. Sub cm remote right cerebellar infarct in addition to patchy T2 hyperintensities within the basal ganglia and thalamus suggesting lacunar infarcts. No midline shift or mass effect.  No abnormal extra-axial fluid collections. Status post bilateral ocular lens implants. No paranasal sinus air-fluid levels, mastoid air cells are well aerated. Severe left  temporomandibular osteoarthrosis with small joint effusion. No abnormal sellar expansion. No cerebellar tonsillar ectopia. Somewhat bright T1 bone marrow signal may reflect osteopenia. There is mild sulcal effacement of the convexities, as seen on prior CT.  MRA HEAD FINDINGS  Anterior circulation: Normal flow related enhancement of the included cervical internal carotid arteries, as well as the petrous, cavernous and supra clinoid internal carotid arteries, mildly filled color ectatic intracranial vessels. Normal appearance of the anterior and middle cerebral arteries.  Posterior circulation: Left vertebral artery is mildly dominant with normal flow related enhancement of the vertebral arteries, vertebrobasilar junction, basilar artery, with a local ectasia. Tiny posterior communicating arteries tube are present. Normal flow early enhancement posterior cerebral arteries. Very slight irregularity of the posterior cerebral arteries.  No large vessel occlusion, hemodynamically significant stenosis, aneurysm, suspicious luminal irregularity.  IMPRESSION: MRI head: Acute 14 mm infarct within the left posterior frontal lobe, precentral gyrus (motor cortex) without hemorrhagic conversion.  Moderate ventriculomegaly, which may be predominately on the basis of brain atrophy, however there is mild sulcal effacement at the convexities which can be seen with normal pressure hydrocephalus.  Moderate to severe white matter changes most consistent with chronic small vessel ischemic disease with patchy bright T2 signal within the basal ganglia and thalamus likely reflecting lacunar infarcts. Scattered foci of susceptibility artifact suggest chronic hypertension.  MRA head: No hemodynamically significant stenosis. Dolichoectasia, which can be seen with chronic hypertension.  Minimal irregularity of the posterior cerebral artery suggest intracranial atherosclerosis.  Electronically Signed: By: Elon Alas On: 05/14/2013 02:05    Mr Jodene Nam Head/brain Wo Cm  05/14/2013   ADDENDUM REPORT: 05/14/2013 02:10  ADDENDUM: Acute findings discussed with and reconfirmed by Derrell Lolling, [facility] on May 14, 2013 at 2:10  a.m.   Electronically Signed   By: Elon Alas   On: 05/14/2013 02:10   05/14/2013   CLINICAL DATA:  Port to 5 days of right sided weakness, predominantly right upper extremity hemi paresis.  EXAM: MRI HEAD WITHOUT CONTRAST  MRA HEAD WITHOUT CONTRAST  TECHNIQUE: Multiplanar, multiecho pulse sequences of the brain and surrounding structures were obtained without intravenous contrast. Angiographic images of the head were obtained using MRA technique without contrast.  COMPARISON:  CT HEAD W/O CM dated 05/12/2013  FINDINGS: MRI HEAD FINDINGS  14 mm focus of reduced diffusion within the left posterior frontal lobe, precentral gyrus with corresponding low ADC values. No susceptibility artifact to suggest hemorrhage. However, there are a few scattered subcentimeter focus of susceptibility artifact predominantly central in distribution.  Moderate ventriculomegaly, likely on the basis of global parenchymal brain volume loss. Patchy to come: Predominately supratentorial T2 hyperintensities is seen, with mild patchy pontine T2 hyperintensities. Sub cm remote right cerebellar infarct in addition to patchy T2 hyperintensities within the basal ganglia and thalamus suggesting lacunar infarcts. No midline shift or mass effect.  No abnormal extra-axial fluid collections. Status post bilateral ocular lens implants. No paranasal sinus air-fluid levels, mastoid air cells are well aerated. Severe left temporomandibular osteoarthrosis with small joint effusion. No abnormal sellar expansion. No cerebellar tonsillar ectopia. Somewhat bright T1 bone marrow signal may reflect osteopenia. There is mild sulcal effacement of the convexities, as seen on prior CT.  MRA HEAD FINDINGS  Anterior circulation: Normal flow related enhancement of the included  cervical internal carotid arteries, as well as the petrous, cavernous and supra clinoid internal carotid arteries, mildly filled color ectatic intracranial vessels. Normal appearance of the anterior and middle cerebral arteries.  Posterior circulation: Left vertebral artery is mildly dominant with normal flow related enhancement of the vertebral arteries, vertebrobasilar junction, basilar artery, with a local ectasia. Tiny posterior communicating arteries tube are present. Normal flow early enhancement posterior cerebral arteries. Very slight irregularity of the posterior cerebral arteries.  No large vessel occlusion, hemodynamically significant stenosis, aneurysm, suspicious luminal irregularity.  IMPRESSION: MRI head: Acute 14 mm infarct within the left posterior frontal lobe, precentral gyrus (motor cortex) without hemorrhagic conversion.  Moderate ventriculomegaly, which may be predominately on the basis of brain atrophy, however there is mild sulcal effacement at the convexities which can be seen with normal pressure hydrocephalus.  Moderate to severe white matter changes most consistent with chronic small vessel ischemic disease with patchy bright T2 signal within the basal ganglia and thalamus likely reflecting lacunar infarcts. Scattered foci of susceptibility artifact suggest chronic hypertension.  MRA head: No hemodynamically significant stenosis. Dolichoectasia, which can be seen with chronic hypertension.  Minimal irregularity of the posterior cerebral artery suggest intracranial atherosclerosis.  Electronically Signed: By: Elon Alas On: 05/14/2013 02:05   Results for orders placed during the hospital encounter of 05/14/13 (from the past 72 hour(s))  URINALYSIS, ROUTINE W REFLEX MICROSCOPIC     Status: Abnormal   Collection Time    05/14/13 11:03 PM      Result Value Ref Range   Color, Urine YELLOW  YELLOW   APPearance CLEAR  CLEAR   Specific Gravity, Urine 1.012  1.005 - 1.030   pH 6.0   5.0 - 8.0   Glucose, UA NEGATIVE  NEGATIVE mg/dL   Hgb urine dipstick TRACE (*) NEGATIVE   Bilirubin Urine NEGATIVE  NEGATIVE   Ketones, ur NEGATIVE  NEGATIVE mg/dL   Protein, ur NEGATIVE  NEGATIVE mg/dL  Urobilinogen, UA 0.2  0.0 - 1.0 mg/dL   Nitrite NEGATIVE  NEGATIVE   Leukocytes, UA MODERATE (*) NEGATIVE  URINE MICROSCOPIC-ADD ON     Status: None   Collection Time    05/14/13 11:03 PM      Result Value Ref Range   Squamous Epithelial / LPF RARE  RARE   WBC, UA 21-50  <3 WBC/hpf   RBC / HPF 0-2  <3 RBC/hpf   Bacteria, UA RARE  RARE  CBC WITH DIFFERENTIAL     Status: Abnormal   Collection Time    05/15/13  5:00 AM      Result Value Ref Range   WBC 6.4  4.0 - 10.5 K/uL   RBC 4.25  3.87 - 5.11 MIL/uL   Hemoglobin 13.4  12.0 - 15.0 g/dL   HCT 39.1  36.0 - 46.0 %   MCV 92.0  78.0 - 100.0 fL   MCH 31.5  26.0 - 34.0 pg   MCHC 34.3  30.0 - 36.0 g/dL   RDW 14.0  11.5 - 15.5 %   Platelets 287  150 - 400 K/uL   Neutrophils Relative % 42 (*) 43 - 77 %   Neutro Abs 2.7  1.7 - 7.7 K/uL   Lymphocytes Relative 41  12 - 46 %   Lymphs Abs 2.6  0.7 - 4.0 K/uL   Monocytes Relative 14 (*) 3 - 12 %   Monocytes Absolute 0.9  0.1 - 1.0 K/uL   Eosinophils Relative 3  0 - 5 %   Eosinophils Absolute 0.2  0.0 - 0.7 K/uL   Basophils Relative 1  0 - 1 %   Basophils Absolute 0.0  0.0 - 0.1 K/uL      Head: Normocephalic.  Eyes: EOM are normal.  Neck: Normal range of motion. Neck supple. No thyromegaly present.  Cardiovascular: Normal rate and regular rhythm.  Respiratory: Effort normal and breath sounds normal. No respiratory distress.  GI: Soft. Bowel sounds are normal. She exhibits no distension.  Neurological: She is alert and oriented to person, place, and time.  Makes good eye contact with examiner. Follows all commands  Skin: Skin is warm and dry.  Psychiatric: She has a normal mood and affect.  motor strength is 2 minus in the right deltoid, bicep, tricep, grip, finger flexors and  extensors  4 minus in the right hip flexor knee extensors 3 minus at the ankle dorsiflexor plantar flexor  5/5 in the left deltoid, bicep and tricep, grip, hip flexor, knee extension, ankle dorsiflexor and plantar flexor  Sensation reduced to light touch on the right side upper greater than lower extremity. Is able to distinguish which finger was touched  No visual field cuts   Assessment/Plan: 1. Functional deficits secondary to Left post frontal infarct which require 3+ hours per day of interdisciplinary therapy in a comprehensive inpatient rehab setting. Physiatrist is providing close team supervision and 24 hour management of active medical problems listed below. Physiatrist and rehab team continue to assess barriers to discharge/monitor patient progress toward functional and medical goals. FIM:                   Comprehension Comprehension Mode: Auditory Comprehension: 5-Understands basic 90% of the time/requires cueing < 10% of the time  Expression Expression Mode: Verbal Expression: 3-Expresses basic 50 - 74% of the time/requires cueing 25 - 50% of the time. Needs to repeat parts of sentences.  Social Interaction Social Interaction: 6-Interacts appropriately with others  with medication or extra time (anti-anxiety, antidepressant).  Problem Solving Problem Solving: 3-Solves basic 50 - 74% of the time/requires cueing 25 - 49% of the time  Memory Memory: 4-Recognizes or recalls 75 - 89% of the time/requires cueing 10 - 24% of the time  Medical Problem List and Plan:  1. DVT Prophylaxis/Anticoagulation: Pharmaceutical: Lovenox  2. Pain Management: Has used meloxicam and cymbalta in the past had stopped taking meds since Husband's death. Willing to resume cymbalta as that has helped with neuropathy in the past. Will use tylenol prn. Add voltaren gel for shoulder pain.  3. Mood: Denies depressed mood. LCSW to follow for evaluation.  4. Neuropsych: This patient is capable  of making decisions on her own behalf.  5. HTN: Will monitor with bid checks. Monitor for orthostasis.  6. Dyslipidemia: Add zocor.     LOS (Days) 1 A FACE TO FACE EVALUATION WAS PERFORMED  Benuel Ly E 05/15/2013, 7:12 AM

## 2013-05-15 NOTE — Progress Notes (Signed)
Occupational Therapy Session Note  Patient Details  Name: Kathy Calhoun MRN: [id number] Date of Birth: 08/18/1933  Today's Date: 05/15/2013 Time: 4098-1191 Time Calculation (min): 45 min   Skilled Therapeutic Interventions/Progress Updates:    Patient seen this afternoon for 1:1 OT to address active movement in right upper extremity.  Patient has active movement in all joints; shoulder, elbow, forearm, wrist and fingers.  She lacks active movement through full range against gravity in elbow, and wrist.  She has a history of shoulder pain for several (greater than 10) years since rotator cuff injury / repair.  Patient with significant guarding in shoulder girdle and neck with all right arm movements.   Guided movement of individual joints, and then combined joints performed in sitting and in standing.  Patient needed assistance for terminal extension in all digits, and has composite grasp sufficient to hold and maneuver cylindrical object approximately 3 inches wide while standing and weight shifting through various ranges.  Patient encouraged to work equally on closing hand (composite flexion) as well as opening digits (composite extension)  Patient also encouraged to begin to isolate individual finger flexion and extension with forearm / wrist supported on table.    Therapy Documentation Precautions:  Precautions Precautions: Fall Precaution Comments: history of rotator cuff repair and low back pain Required Braces or Orthoses:  (sling issued for increased support to right upper extremity) Restrictions Weight Bearing Restrictions: No  Pain: Pain Assessment Pain Assessment: 0-10 Pain Score: 8  Pain Type: Chronic pain Pain Location: Shoulder Pain Orientation: Right Pain Descriptors / Indicators: Aching Pain Onset: On-going Pain Intervention(s): Splinting;Repositioned;Other (Comment) (Donned GivMorh sling; pt declined medication) Multiple Pain Sites: No  See FIM for current functional  status  Therapy/Group: Individual Therapy  Mariah Milling 05/15/2013, 4:04 PM

## 2013-05-15 NOTE — Progress Notes (Signed)
Social Work Assessment and Plan Social Work Assessment and Plan  Patient Details  Name: Kathy Calhoun MRN: [id number] Date of Birth: 1933/04/16  Today's Date: 05/15/2013  Problem List:  Patient Active Problem List   Diagnosis Date Noted  . Noncompliance 05/14/2013  . Essential hypertension, benign 05/13/2013  . Depression 05/13/2013  . CVA (cerebral infarction) 05/12/2013   Past Medical History:  Past Medical History  Diagnosis Date  . Osteoporosis   . HTN (hypertension)   . Disc     bulging lumbar disc has had steriod injections  x3   Past Surgical History:  Past Surgical History  Procedure Laterality Date  . Cataract extraction      bilaterally 2011  . Appendectomy    . Tubal ligation    . Hysterotomy    . Oophorectomy    . Laparotomy      large benign mass removed 1993  . Nasal septum surgery     Social History:  reports that she has never smoked. She does not have any smokeless tobacco history on file. She reports that she does not drink alcohol or use illicit drugs.  Family / Support Systems Marital Status: Widow/Widower How Long?: Nov 2014 Patient Roles: Parent Children: Akshara Blumenthal [phone]  Santiago Glad Rios-daughter  [phone] Other Supports: Audry Pili Lall-adopted son  [phone] Anticipated Caregiver: Audry Pili and neighbors and self Ability/Limitations of Caregiver: Audry Pili is 78 yo and in and out, pt feels he will be there intermittently-she wants to be mod/i levle at discharge. Caregiver Availability: Intermittent Family Dynamics: Pt has fostered atleast one 59 children through the years and all have stayed in touch and supportive.  Her son and daughter are very close to her but out of state.  Pt just lost her husband in Nov after years of declining health-severe Alzhiemers.  Social History Preferred language: English Religion: Methodist Cultural Background: No issues Education: [employer] Read: Yes Write: Yes Employment Status: Retired Insurance underwriter Issues: No issues Guardian/Conservator: None-according to MD pt is capable of making her own decisions while here.   Abuse/Neglect Physical Abuse: Denies Verbal Abuse: Denies Sexual Abuse: Denies Exploitation of patient/patient's resources: Denies Self-Neglect: Denies  Emotional Status Pt's affect, behavior adn adjustment status: Pt is motivated and wants to do well here. She has always been independent and helped others it is not in her mind-set to ask for help.  Discussed the importance of using call light and the staff is here to do for her.  She wants to do as much as she can for herself. Recent Psychosocial Issues: Other medical issues-shoulder limits her and still grieving the loss of her husband Pyschiatric History: No history-deferred dperssion screen due to pt is coping appropriately and will let worker know if anything changes.  Will provide support while here, her pastor came in to visit while I was there.  Pt states; ' That was so nice and thoughtful, it really helped a lot." Substance Abuse History: No issues  Patient / Family Perceptions, Expectations & Goals Pt/Family understanding of illness & functional limitations: Pt is able to explain her stroke and deficits.  She is motivgated and encouraged by her leg movement, she feels she can deal with her arm since this is her bad shoulder anyway.  She plans to work hard and see how much she can recover before going home.  She waited to come in because her children have just dealt with her husband and not to cause them any more stress. Premorbid pt/family roles/activities:  Mother, grandmother, Retiree, Church member, etc Anticipated changes in roles/activities/participation: resume at discharge Pt/family expectations/goals: Pt states: " I want to do for myself like I always have, I'm not good at asking for help."  [employer]: None Premorbid Home Care/DME Agencies: None Transportation  available at discharge: [employer] referrals recommended: Support group (specify) ([facility] SUpport group)  Discharge Planning Living Arrangements: [place]: Children;Other relatives;Friends/neighbors;Church/faith community Type of Residence: Private residence Insurance Resources: Multimedia programmer (specify) Primary school teacher) Financial Resources: Fish farm manager;Family Support Financial Screen Referred: No Living Expenses: Own Money Management: Patient Does the patient have any problems obtaining your medications?: No Home Management: Self Patient/Family Preliminary Plans: Return home with some assistance from her adopted son-Ricky, but he will not always be there.  She will get neighbors to check in on and provide transportation assistance.  She needs to be mod/i beofre going home to be safe. Social Work Anticipated Follow Up Needs: HH/OP;Support Group  Clinical Impression Pleasant female who is willing to work hard and regain her independence.  She is very independent and was one to assist others. Will await team's evaluations and come up with a safe discharge plan.  Elease Hashimoto 05/15/2013, 10:25 AM

## 2013-05-15 NOTE — Evaluation (Signed)
Physical Therapy Assessment and Plan  Patient Details  Name: Kathy Calhoun MRN: [id number] Date of Birth: 01-19-1934  PT Diagnosis: Abnormal posture, Abnormality of gait, Difficulty walking, Hemiparesis dominant, Hypotonia, Impaired sensation, Muscle weakness and Pain in R shoulder Rehab Potential: Excellent ELOS: 5-7 days   Today's Date: 05/15/2013 Time: 9562-1308 Time Calculation (min): 60 min  Problem List:  Patient Active Problem List   Diagnosis Date Noted  . Dyslipidemia 05/15/2013  . Noncompliance 05/14/2013  . Essential hypertension, benign 05/13/2013  . Depression 05/13/2013  . CVA (cerebral infarction) 05/12/2013    Past Medical History:  Past Medical History  Diagnosis Date  . Osteoporosis   . HTN (hypertension)   . Disc     bulging lumbar disc has had steriod injections  x3   Past Surgical History:  Past Surgical History  Procedure Laterality Date  . Cataract extraction      bilaterally 2011  . Appendectomy    . Tubal ligation    . Hysterotomy    . Oophorectomy    . Laparotomy      large benign mass removed 1993  . Nasal septum surgery      Assessment & Plan Clinical Impression: Patient is a 80 y.o. right-handed female with history of hypertension--"no BP medication due to falls", chronic LBP and R-RTC problems with RUE weakness. Admitted 05/12/2013 with 4-5 day history of right-sided weakness, right facial numbness and difficulty walking. CT of the head showed focal oval area of low attenuation left parietal lobe deep white matter above the level of the ventricles. MRI showed acute infarct within the left posterior frontal lobe, precentral gyrus. MRA of the head without stenosis. Echocardiogram with ejection fraction 65% grade 1 diastolic dysfunction. Carotid Dopplers without ICA stenosis. Patient placed on ASA for secondary stroke prevention.  Patient transferred to CIR on 05/14/2013 .   Patient currently requires min with mobility secondary to muscle  weakness, decreased coordination and decreased standing balance, decreased postural control, hemiplegia and decreased balance strategies.  Prior to hospitalization, patient was independent  with mobility and lived with Family;Son in a House home.  Home access is 1 to enter front door; level entry to basementStairs to enter.  Patient will benefit from skilled PT intervention to maximize safe functional mobility, minimize fall risk and decrease caregiver burden for planned discharge home with intermittent assist.  Anticipate patient will benefit from follow up OP at discharge.  PT - End of Session Activity Tolerance: Endurance does not limit participation in activity Endurance Deficit: No PT Assessment Rehab Potential: Excellent PT Patient demonstrates impairments in the following area(s): Balance;Motor;Sensory PT Transfers Functional Problem(s): Bed to Chair;Car;Floor;Furniture;Bed Mobility PT Locomotion Functional Problem(s): Ambulation;Stairs PT Plan PT Intensity: Minimum of 1-2 x/day ,45 to 90 minutes PT Frequency: 5 out of 7 days PT Duration Estimated Length of Stay: 5-7 days PT Treatment/Interventions: Ambulation/gait training;Balance/vestibular training;Discharge planning;DME/adaptive equipment instruction;Functional mobility training;Neuromuscular re-education;Patient/family education;Stair training;Therapeutic Activities;Therapeutic Exercise;UE/LE Strength taining/ROM;UE/LE Coordination activities PT Transfers Anticipated Outcome(s): Mod I PT Locomotion Anticipated Outcome(s): Mod I PT Recommendation Recommendations for Other Services: Speech consult Follow Up Recommendations: Outpatient PT Patient destination: Home Equipment Recommended: None recommended by PT  Skilled Therapeutic Intervention Pt participated in PT evaluation. Pt reporting increased R shoulder pain since CVA.  Educated pt on positioning and supporting her RUE to avoid further joint distraction and for pain and edema  management.  Also provided pt with Give[employer] sling during standing and gait activities for joint support/approximation but continue to allow full ROM and  active use of with elbow, wrist and hand.  Performed gait back to room with GiveMohr sling donned.  Pt noted to have slightly decreased R lateral lean during gait and reporting feeling more supported in sling but did not report change in pain.  Pt positioned in recliner with RUE supported on pillow.    PT Evaluation Precautions/Restrictions Precautions Precautions: Fall Precaution Comments: premorbid R RTC repair; now weaker with pain Restrictions Weight Bearing Restrictions: No General Chart Reviewed: Yes Response to Previous Treatment: Patient with no complaints from previous session. Family/Caregiver Present: Yes (daughter in law)  Pain Pain Assessment Pain Assessment: 0-10 Pain Score: 2  Pain Type: Chronic pain Pain Location: Shoulder Pain Orientation: Right Pain Descriptors / Indicators: Aching;Discomfort Pain Onset: On-going Pain Intervention(s): Repositioned Home Living/Prior Functioning Home Living Living Arrangements: Children Available Help at Discharge: Family;Available PRN/intermittently Type of Home: House Home Access: Stairs to enter [employer] of Steps: 1 to enter front door; level entry to basement Home Layout: Laundry or work area in basement;Full bath on main level;Able to live on main level with bedroom/bathroom;Two level Alternate Level Stairs-Number of Steps: 12 Alternate Level Stairs-Rails: Right;Left  Lives With: Family;Son Prior Function Level of Independence: Independent with homemaking with ambulation;Independent with gait;Independent with transfers  Able to Take Stairs?: Yes Driving: Yes (but did not have a car; rode with son) Vocation: Retired Comments: Household activities; has to walk outside, across grass to enter basement at level entry; avoids full flight of stairs Vision/Perception   Perception Perception: Within Functional Limits Praxis Praxis: Intact  Cognition Overall Cognitive Status: Within Functional Limits for tasks assessed Orientation Level: Oriented X4 Sensation Sensation Light Touch: Impaired Detail Light Touch Impaired Details: Impaired RUE;Impaired RLE Stereognosis: Not tested Hot/Cold: Not tested Proprioception: Appears Intact Coordination Gross Motor Movements are Fluid and Coordinated: No Coordination and Movement Description: impaired by weakness Motor  Motor Motor: Hemiplegia;Abnormal postural alignment and control Motor - Skilled Clinical Observations: RUE > RLE hemiparesis; R shoulder droop with R trunk flexion, L ribs flared  Mobility Bed Mobility Bed Mobility: Not assessed Transfers Transfers: Yes Stand Pivot Transfers: 4: Min assist Stand Pivot Transfer Details (indicate cue type and reason): Min A for balance while pivoting bed <> w/c <> mat or recliner; no lifting or lowering assistance needed Locomotion  Ambulation Ambulation/Gait Assistance: 4: Min assist Ambulation Distance (Feet): 125 Feet Gait Gait: Yes Gait Pattern: Impaired Gait Pattern: Step-through pattern;Decreased step length - right;Decreased step length - left;Decreased stance time - right;Decreased stride length;Decreased weight shift to right;Lateral trunk lean to right High Level Ambulation High Level Ambulation: Head turns Head Turns: causes pt to slow velocity Stairs / Additional Locomotion Stairs: Yes Stairs Assistance: 4: Min assist Stairs Assistance Details (indicate cue type and reason): Pt performed stairs automatically with pt electing to ascend with LLE but also descended with LLE and noted "catch" in RLE; cued to descend with RLE.  Min A needed for balance. Stair Management Technique: Two rails;Step to pattern;Forwards Number of Stairs: 5 Height of Stairs: 6 Wheelchair Mobility Wheelchair Mobility: No Distance: 150  Trunk/Postural Assessment   Cervical Assessment Cervical Assessment: Within Functional Limits Thoracic Assessment Thoracic Assessment: Exceptions to [facility] (R trunk shortened, L trunk flared) Lumbar Assessment Lumbar Assessment: Within Functional Limits Postural Control Postural Control: Deficits on evaluation (decreased balance reactions in standing)  Balance Static Sitting Balance Static Sitting - Level of Assistance: 7: Independent Dynamic Sitting Balance Dynamic Sitting - Level of Assistance: 7: Independent Static Standing Balance Static Standing - Balance Support:  No upper extremity supported Static Standing - Level of Assistance: 4: Min assist Dynamic Standing Balance Dynamic Standing - Balance Support: No upper extremity supported Dynamic Standing - Level of Assistance: 4: Min assist Extremity Assessment  RUE Assessment RUE Assessment: Exceptions to [facility] (Active finger flexion / extension present / weak, but no volitional use witnessed)   RLE Assessment RLE Assessment: Exceptions to [facility] RLE Strength RLE Overall Strength: Deficits RLE Overall Strength Comments: 3/5 overall LLE Assessment LLE Assessment: Within Functional Limits  FIM:  FIM - Control and instrumentation engineer Devices: Arm rests Bed/Chair Transfer: 4: Bed > Chair or W/C: Min A (steadying Pt. > 75%);4: Chair or W/C > Bed: Min A (steadying Pt. > 75%) FIM - Locomotion: Wheelchair Distance: 150 Locomotion: Wheelchair: 1: Total Assistance/staff pushes wheelchair (Pt<25%) FIM - Locomotion: Ambulation Locomotion: Ambulation Assistive Devices: Other (comment) (HHA) Ambulation/Gait Assistance: 4: Min assist Locomotion: Ambulation: 2: Travels 50 - 149 ft with minimal assistance (Pt.>75%) FIM - Locomotion: Stairs Locomotion: Scientist, physiological: Hand rail - 2 Locomotion: Stairs: 2: Up and Down 4 - 11 stairs with minimal assistance (Pt.>75%)   Refer to Care Plan for Long Term Goals  Recommendations for other services:  Other: Speech evaluation for dysarthria  Discharge Criteria: Patient will be discharged from PT if patient refuses treatment 3 consecutive times without medical reason, if treatment goals not met, if there is a change in medical status, if patient makes no progress towards goals or if patient is discharged from hospital.  The above assessment, treatment plan, treatment alternatives and goals were discussed and mutually agreed upon: by patient  Raylene Everts Faucette 05/15/2013, 11:31 AM

## 2013-05-15 NOTE — Evaluation (Signed)
Occupational Therapy Assessment and Plan  Patient Details  Name: Kathy Calhoun MRN: [id number] Date of Birth: April 26, 1933  OT Diagnosis: abnormal posture, flaccid hemiplegia and hemiparesis, hemiplegia affecting dominant side, lumbago (low back pain), muscular wasting and disuse atrophy, muscle weakness (generalized), pain in joint and swelling of limb Rehab Potential: Rehab Potential: Excellent ELOS: 6-7 days   Today's Date: 05/15/2013 Time: 1000-1130 Time Calculation (min): 90 min  Problem List:  Patient Active Problem List   Diagnosis Date Noted  . Dyslipidemia 05/15/2013  . Noncompliance 05/14/2013  . Essential hypertension, benign 05/13/2013  . Depression 05/13/2013  . CVA (cerebral infarction) 05/12/2013    Past Medical History:  Past Medical History  Diagnosis Date  . Osteoporosis   . HTN (hypertension)   . Disc     bulging lumbar disc has had steriod injections  x3   Past Surgical History:  Past Surgical History  Procedure Laterality Date  . Cataract extraction      bilaterally 2011  . Appendectomy    . Tubal ligation    . Hysterotomy    . Oophorectomy    . Laparotomy      large benign mass removed 1993  . Nasal septum surgery      Assessment & Plan Clinical Impression: Patient is a 80 y.o. right-handed female with history of hypertension--"no BP medication due to falls", chronic LBP and R-RTC problems with RUE weakness. Admitted 05/12/2013 with 4-5 day history of right-sided weakness, right facial numbness and difficulty walking. CT of the head showed focal oval area of low attenuation left parietal lobe deep white matter above the level of the ventricles. MRI showed acute infarct within the left posterior frontal lobe, precentral gyrus. MRA of the head without stenosis. Echocardiogram with ejection fraction 20% grade 1 diastolic dysfunction. Carotid Dopplers without ICA stenosis.  Patient transferred to CIR on 05/14/2013 .    Patient currently requires mod with  basic self-care skills and IADL secondary to muscle weakness and abnormal tone, unbalanced muscle activation and decreased coordination.  Prior to hospitalization, patient could complete basic ADL/IADL with independent .  Patient will benefit from skilled intervention to decrease level of assist with basic self-care skills, increase independence with basic self-care skills and increase level of independence with iADL prior to discharge home with care partner.  Anticipate patient will require intermittent supervision and follow up outpatient.  OT - End of Session Activity Tolerance: Tolerates 30+ min activity with multiple rests Endurance Deficit: No OT Assessment Rehab Potential: Excellent OT Patient demonstrates impairments in the following area(s): Balance;Motor;Edema;Endurance;Safety;Pain;Sensory OT Basic ADL's Functional Problem(s): Eating;Grooming;Bathing;Dressing;Toileting OT Advanced ADL's Functional Problem(s): Simple Meal Preparation;Light Housekeeping;Laundry OT Transfers Functional Problem(s): Toilet;Tub/Shower OT Additional Impairment(s): Fuctional Use of Upper Extremity (right / dominant) OT Plan OT Intensity: Minimum of 1-2 x/day, 45 to 90 minutes OT Duration/Estimated Length of Stay: 6-7 days OT Treatment/Interventions: Balance/vestibular training;Community reintegration;Disease mangement/prevention;Functional electrical stimulation;Neuromuscular re-education;Patient/family education;Self Care/advanced ADL retraining;Splinting/orthotics;Therapeutic Exercise;UE/LE Coordination activities;Discharge planning;DME/adaptive equipment instruction;Functional mobility training;Pain management;Psychosocial support;Therapeutic Activities;UE/LE Strength taining/ROM OT Self Feeding Anticipated Outcome(s): modified independent OT Basic Self-Care Anticipated Outcome(s): modified independent OT Toileting Anticipated Outcome(s): modified independent OT Bathroom Transfers Anticipated Outcome(s):  modified independent OT Recommendation Recommendations for Other Services: Neuropsych consult Patient destination: Home Follow Up Recommendations: Outpatient OT Equipment Recommended: Tub/shower seat   Skilled Therapeutic Intervention Patient seen this am for OT evaluation and assessment of basic self care skills.  Patient very pleasant, and eager to know what lead to this stroke.  Patient able to specifically  verbalize deficits of this stroke.  Patient with more impairment evident in right upper extremity, than lower extremity.  Patient has active finger flexion and extension, but did not incorporate into function spontaneously.  Would attempt to use with assistance and /or cueing.  Patient very eager for high level of independence as she lives with 78 year old female who she prefers not to help her with personal care.  Patient highly motivated, yet displaying safe performance with mobility tasks.  Patient appears to have good family support with son / daughter in law living in [place], and daughter (Education officer, museum) living in [place], to mention two children.  Patient is recently widowed, although her husband did not live with her for the last 8-9 months of his life.  Patient has active driver's license, but no vehicle, and she relies on family, neighbors, and friends to take her out into community.  Discussed evaluation findings with patient and strongly encouraged OP vs HH if transportation could be arranged.  Patient with excellent potential for further right UE function with continued therapy.    OT Evaluation Precautions/Restrictions  Precautions Precautions: Fall Precaution Comments: history of rotator cuff repair and low back pain Required Braces or Orthoses:  (sling issued for increased support to right upper extremity) Restrictions Weight Bearing Restrictions: No General Chart Reviewed: Yes Family/Caregiver Present: No   Pain Pain Assessment Pain Assessment: 0-10 Pain Score: 2   Pain Type: Chronic pain Pain Location: Shoulder Pain Orientation: Right Pain Descriptors / Indicators: Aching Pain Onset: On-going Pain Intervention(s): Repositioned Multiple Pain Sites: No Home Living/Prior Functioning Home Living Family/patient expects to be discharged to:: Private residence Living Arrangements: Children Available Help at Discharge: Family;Neighbor;Friend(s);Available PRN/intermittently Type of Home: House Home Access: Stairs to enter [employer] of Steps: 1 to enter front door; level entry to basement Home Layout: Laundry or work area in basement;Full bath on main level;Able to live on main level with bedroom/bathroom;Two level Alternate Level Stairs-Number of Steps: 12 Alternate Level Stairs-Rails: Right;Left  Lives With: Family;Son IADL History Homemaking Responsibilities: Yes Meal Prep Responsibility: Primary Current License: Yes (No longer has a car) Mode of Transportation: Friends;Family Prior Function Level of Independence: Independent with basic ADLs;Independent with homemaking with ambulation;Independent with gait;Independent with transfers  Able to Take Stairs?: Yes Driving: Yes Vocation: Retired Leisure: Hobbies-yes (Comment) Comments: walks her dog, active with family / extended family   Vision/Perception  Vision - History Baseline Vision: Wears glasses only for reading Vision - Assessment Eye Alignment: Within Functional Limits Additional Comments: Reports no change from baseline.   Perception Perception: Within Functional Limits Praxis Praxis: Intact  Cognition Overall Cognitive Status: Within Functional Limits for tasks assessed Arousal/Alertness: Awake/alert Orientation Level: Oriented X4 Attention: Alternating Alternating Attention: Appears intact Memory: Appears intact Awareness: Appears intact Problem Solving: Appears intact Safety/Judgment: Appears intact Sensation Sensation Light Touch: Impaired Detail Light  Touch Impaired Details: Impaired RUE;Impaired RLE Stereognosis: Not tested Hot/Cold: Not tested Proprioception: Appears Intact Coordination Gross Motor Movements are Fluid and Coordinated: No Coordination and Movement Description: impaired by weakness Motor  Motor Motor: Hemiplegia;Abnormal postural alignment and control Motor - Skilled Clinical Observations: RUE > RLE hemiparesis; R shoulder droop with R trunk flexion, L ribs flared Mobility  Bed Mobility Bed Mobility: Not assessed  Trunk/Postural Assessment  Cervical Assessment Cervical Assessment: Within Functional Limits Thoracic Assessment Thoracic Assessment: Exceptions to [facility] Lumbar Assessment Lumbar Assessment: Within Functional Limits Postural Control Postural Control: Deficits on evaluation  Balance Balance Balance Assessed: Yes Static Sitting Balance Static Sitting -  Balance Support: No upper extremity supported;Feet supported Static Sitting - Level of Assistance: 7: Independent Static Sitting - Comment/# of Minutes: 15 Dynamic Sitting Balance Dynamic Sitting - Balance Support: No upper extremity supported;During functional activity Dynamic Sitting - Level of Assistance: 7: Independent Dynamic Sitting - Balance Activities: Lateral lean/weight shifting;Forward lean/weight shifting;Reaching for objects;Reaching across midline Static Standing Balance Static Standing - Balance Support: No upper extremity supported;During functional activity Static Standing - Level of Assistance: 4: Min assist Static Standing - Comment/# of Minutes: 2 minutes to complete hygiene Dynamic Standing Balance Dynamic Standing - Balance Support: During functional activity;No upper extremity supported Dynamic Standing - Level of Assistance: 4: Min assist Dynamic Standing - Balance Activities: Reaching for objects;Reaching across midline;Forward lean/weight shifting;Lateral lean/weight shifting Extremity/Trunk Assessment RUE Assessment RUE  Assessment: Exceptions to [facility] (Active finger flexion / extension present / weak, but no volitional use witnessed) LUE Assessment LUE Assessment: Within Functional Limits  FIM:  FIM - Eating Eating Activity: 5: Set-up assist for cut food;5: Set-up assist for open containers;6: Assistive device: dentures FIM - Grooming Grooming Steps: Wash, rinse, dry face;Wash, rinse, dry hands;Brush, comb hair Grooming: 4: Patient completes 3 of 4 or 4 of 5 steps FIM - Bathing Bathing Steps Patient Completed: Chest;Right Arm;Abdomen;Front perineal area;Buttocks;Right upper leg;Left upper leg Bathing: 3: Mod-Patient completes 5-7 [id number] parts or 50-74% FIM - Upper Body Dressing/Undressing Upper body dressing/undressing steps patient completed: Thread/unthread right sleeve of front closure shirt/dress;Thread/unthread left sleeve of front closure shirt/dress;Pull shirt around back of front closure shirt/dress Upper body dressing/undressing: 4: Min-Patient completed 75 plus % of tasks FIM - Lower Body Dressing/Undressing Lower body dressing/undressing steps patient completed: Thread/unthread right underwear leg;Thread/unthread left underwear leg;Thread/unthread right pants leg;Thread/unthread left pants leg;Don/Doff left shoe Lower body dressing/undressing: 3: Mod-Patient completed 50-74% of tasks FIM - Toileting Toileting steps completed by patient: Adjust clothing prior to toileting;Performs perineal hygiene Toileting: 3: Mod-Patient completed 2 of 3 steps FIM - Bed/Chair Transfer Bed/Chair Transfer Assistive Devices: Arm rests Bed/Chair Transfer: 4: Bed > Chair or W/C: Min A (steadying Pt. > 75%);4: Chair or W/C > Bed: Min A (steadying Pt. > 75%) FIM - TRadio producerDevices: Grab bars Toilet Transfers: 4-To toilet/BSC: Min A (steadying Pt. > 75%);4-From toilet/BSC: Min A (steadying Pt. > 75%) FIM - Tub/Shower Transfers Tub/Shower Assistive Devices: Tub transfer bench;Grab  bars Tub/shower Transfers: 4-Into Tub/Shower: Min A (steadying Pt. > 75%/lift 1 leg);4-Out of Tub/Shower: Min A (steadying Pt. > 75%/lift 1 leg)   Refer to Care Plan for Long Term Goals  Recommendations for other services: Neuropsych  Discharge Criteria: Patient will be discharged from OT if patient refuses treatment 3 consecutive times without medical reason, if treatment goals not met, if there is a change in medical status, if patient makes no progress towards goals or if patient is discharged from hospital.  The above assessment, treatment plan, treatment alternatives and goals were discussed and mutually agreed upon: by patient  GMariah Milling3/07/2013, 12:01 PM

## 2013-05-15 NOTE — Care Management Note (Signed)
[facility] Individual Statement of Services  Patient Name:  Kathy Calhoun  Date:  05/15/2013  Welcome to the [facility].  Our goal is to provide you with an individualized program based on your diagnosis and situation, designed to meet your specific needs.  With this comprehensive rehabilitation program, you will be expected to participate in at least 3 hours of rehabilitation therapies Monday-Friday, with modified therapy programming on the weekends.  Your rehabilitation program will include the following services:  Physical Therapy (PT), Occupational Therapy (OT), Speech Therapy (ST), 24 hour per day rehabilitation nursing, Case Management (Social Worker), Rehabilitation Medicine and Nutrition Services  Weekly team conferences will be held on Wednesday to discuss your progress.  Your Social Worker will talk with you frequently to get your input and to update you on team discussions.  Team conferences with you and your family in attendance may also be held.  Expected length of stay: 5-7 days  Overall anticipated outcome: mod/i level  Depending on your progress and recovery, your program may change. Your Social Worker will coordinate services and will keep you informed of any changes. Your Social Worker's name and contact numbers are listed  below.  The following services may also be recommended but are not provided by the [facility] will be made to provide these services after discharge if needed.  Arrangements include referral to agencies that provide these services.  Your insurance has been verified to be:  [employer]-Medicare Your primary doctor is:  Dr Lajean Manes  Pertinent information will be shared with your doctor and your insurance company.  Social Worker:  Ovidio Kin, [place] or ([phone]  Information discussed with and copy given to patient by: Elease Hashimoto, 05/15/2013, 10:10 AM

## 2013-05-15 NOTE — Progress Notes (Signed)
Patient information reviewed and entered into eRehab system by Crawford Tamura, RN, CRRN, PPS Coordinator.  Information including medical coding and functional independence measure will be reviewed and updated through discharge.     Per nursing patient was given "Data Collection Information Summary for Patients in Inpatient Rehabilitation Facilities with attached "Privacy Act Statement-Health Care Records" upon admission.  

## 2013-05-16 ENCOUNTER — Inpatient Hospital Stay (HOSPITAL_COMMUNITY): Payer: Medicare Other | Admitting: Physical Therapy

## 2013-05-16 ENCOUNTER — Inpatient Hospital Stay (HOSPITAL_COMMUNITY): Payer: Medicare Other

## 2013-05-16 ENCOUNTER — Inpatient Hospital Stay (HOSPITAL_COMMUNITY): Payer: Medicare Other | Admitting: Occupational Therapy

## 2013-05-16 DIAGNOSIS — I10 Essential (primary) hypertension: Secondary | ICD-10-CM

## 2013-05-16 DIAGNOSIS — I6350 Cerebral infarction due to unspecified occlusion or stenosis of unspecified cerebral artery: Secondary | ICD-10-CM

## 2013-05-16 LAB — URINE CULTURE

## 2013-05-16 NOTE — Progress Notes (Signed)
Social Work Patient ID: Kathy Calhoun, female   DOB: 1933/06/22, 80 y.o.   MRN: [id number] Met with pt who asked this worker to contact her daughter in [place] to answer her questions.  Have spoken with [name]-pt's daughter and answered her questions and informed her how well pt is doing and her treatment goals.  Will update once team conference.

## 2013-05-16 NOTE — Evaluation (Signed)
Speech Language Pathology Assessment and Plan  Patient Details  Name: Kathy Calhoun MRN: [id number] Date of Birth: September 09, 1933  SLP Diagnosis: Cognitive Impairments  Rehab Potential: Excellent ELOS: 5-7 days   Today's Date: 05/16/2013 Time: 6578-4696 Time Calculation (min): 60 min  Problem List:  Patient Active Problem List   Diagnosis Date Noted  . Dyslipidemia 05/15/2013  . Noncompliance 05/14/2013  . Essential hypertension, benign 05/13/2013  . Depression 05/13/2013  . CVA (cerebral infarction) 05/12/2013   Past Medical History:  Past Medical History  Diagnosis Date  . Osteoporosis   . HTN (hypertension)   . Disc     bulging lumbar disc has had steriod injections  x3   Past Surgical History:  Past Surgical History  Procedure Laterality Date  . Cataract extraction      bilaterally 2011  . Appendectomy    . Tubal ligation    . Hysterotomy    . Oophorectomy    . Laparotomy      large benign mass removed 1993  . Nasal septum surgery      Assessment / Plan / Recommendation Clinical Impression Pt is a 80 y.o. right-handed female with h/o hypertension--"no BP medication due to falls", chronic LBP and R-RTC problems with RUE weakness. Admitted 3/02 with 4-5 day h/o right-sided weakness, right facial numbness and difficulty walking. CT of the head showed focal oval area of low attenuation left parietal lobe deep white matter above the level of the ventricles. MRI showed acute infarct within the left posterior frontal lobe, precentral gyrus. MRA of the head without stenosis. Echocardiogram with ejection fraction 29% grade 1 diastolic dysfunction. Carotid Dopplers without ICA stenosis. Patient placed on ASA for secondary stroke prevention. Therapy initiated and CIR recommended by rehab team. SLP cognitive-linguistic evaluation revealed mildly reduced complex problem solving skills and recall of new information, with pt reporting that she needed to "think about things more" than she  did before. Pt will benefit from SLP services to maximize functional independence prior to discharge home.   Skilled Therapeutic Interventions          SLP cognitive-linguistic evaluation completed with results and recommendations reviewed with family.  SLP Assessment  Patient will need skilled [facility] during CIR admission    Recommendations  Patient destination: Home Follow up Recommendations: Home Health SLP;Outpatient SLP Equipment Recommended: None recommended by SLP    SLP Frequency 5 out of 7 days   SLP Treatment/Interventions Cognitive remediation/compensation;Cueing hierarchy;Medication managment;Environmental controls;Functional tasks;Internal/external aids;Patient/family education    Pain Pain Assessment Pain Assessment: No/denies pain Pain Score: 0-No pain Prior Functioning Cognitive/Linguistic Baseline: Within functional limits Type of Home: House  Lives With: Family;Son Available Help at Discharge: Family;Neighbor;Friend(s);Available PRN/intermittently Vocation: Retired  Industrial/product designer Term Goals: Week 1: SLP Short Term Goal 1 (Week 1): Pt will demonstrate adequate medication management with Mod I SLP Short Term Goal 2 (Week 1): Pt will demonstrate complex problem solving during functional task with Mod I SLP Short Term Goal 3 (Week 1): Pt will use external memory aids to recall new and daily information with Mod I  See FIM for current functional status Refer to Care Plan for Long Term Goals  Recommendations for other services: None  Discharge Criteria: Patient will be discharged from SLP if patient refuses treatment 3 consecutive times without medical reason, if treatment goals not met, if there is a change in medical status, if patient makes no progress towards goals or if patient is discharged from hospital.  The above assessment,  treatment plan, treatment alternatives and goals were discussed and mutually agreed upon: by patient and by  family   Laura Paiewonsky, M.A. CCC-SLP [phone]  Paiewonsky, Laura 05/16/2013, 10:26 AM   

## 2013-05-16 NOTE — Progress Notes (Signed)
Physical Therapy Session Note  Patient Details  Name: Kathy Calhoun MRN: [id number] Date of Birth: [birth year]  Today's Date: 05/16/2013 Time: 1112-1208 and 3300-7622 Time Calculation (min): 56 min and 22 min  Short Term Goals: Week 1:  PT Short Term Goal 1 (Week 1): = LTG secondary to short LOS  Skilled Therapeutic Interventions/Progress Updates:   Pt resting in recliner with family present.  Performed transfer from recliner and performed reaching in room for various items on bedside table reaching across midline with supervision-min A.  Performed gait x 150' to gym with min A with improved velocity but still demonstrating decreased stance on RLE, decreased weight shift to R, R hip ADD/IR to compensate for hip ABD weakness and bring BOS under COG; when cue to widen BOS pt noted to have lateral LOB.  Performed NMR; see below for details.  Continued gait training with dual task training of head turns, changes in gait speed, sudden stops, weaving L and R around cones for changes in direction, retro stepping and then cognitive naming task during gait x 200' with min A and intermittent assistance to regain balance during changes in direction.     PM session: Pt resting in bed; performed supine >sit mod I.  Performed gait x 150' in controlled environment without AD but with supervision-min A with improved velocity, BOS width and heel strike on RLE.  Performed stair negotiation training up/down 2 stairs (6") x 3 reps without rails with step to sequencing with min A and verbal cues for safe sequence.  Pt demonstrated lateral LOB during descending with RLE.  Introduced [facility] for increased BOS and stability during stair negotiation; performed stair negotiation up/down 2 stairs x 2 reps with LUE support with SPC and min A and verbal cues for safe step to sequence with SPC.  Returned to room performing gait with SPC in LUE and supervision-min A with verbal cues for sequencing and for balance during dual task of  coordinating gait and obstacle negotiation in hallway.  Returned to EOB supervision.    Therapy Documentation Precautions:  Precautions Precautions: Fall Precaution Comments: history of rotator cuff repair and low back pain Required Braces or Orthoses:  (sling issued for increased support to right upper extremity) Restrictions Weight Bearing Restrictions: No Pain: Pain Assessment Pain Assessment: No/denies pain Pain Score: 0-No pain Locomotion : Ambulation Ambulation/Gait Assistance: 4: Min assist  Other Treatments: Treatments Neuromuscular Facilitation: Right;Upper Extremity;Lower Extremity;Activity to increase motor control;Activity to increase coordination;Activity to increase timing and sequencing;Activity to increase sustained activation;Activity to increase lateral weight shifting;Activity to increase anterior-posterior weight shifting during resisted RLE hip ABD x 25' x 2 reps (R sidestepping to R against resistance of BOSU ball) and 25' x 2 reps of tandem gait with min A for hip balance reaction training; performed 10 reps R lateral step ups on BOSU ball with focus on activation of glute activation during R lateral weight shift and LE extension with min-mod A to maintain balance.  Required frequent rest breaks.  GiveMohr sling used during standing balance and gait for RUE stability and support and for pain management.    See FIM for current functional status  Therapy/Group: Individual Therapy  Raylene Everts [facility] 05/16/2013, 12:23 PM

## 2013-05-16 NOTE — IPOC Note (Signed)
Overall Plan of Care [facility]) Patient Details Name: Kathy Calhoun MRN: [id number] DOB: Apr 25, 1933  Admitting Diagnosis: L CVA  Hospital Problems: Principal Problem:   CVA (cerebral infarction) Active Problems:   Essential hypertension, benign   Dyslipidemia     Functional Problem List: Nursing Endurance;Medication Management;Motor;Safety;Skin Integrity  PT Balance;Motor;Sensory  OT Balance;Motor;Edema;Endurance;Safety;Pain;Sensory  SLP Cognition  TR         Basic ADL's: OT Eating;Grooming;Bathing;Dressing;Toileting     Advanced  ADL's: OT Simple Meal Preparation;Light Housekeeping;Laundry     Transfers: PT Bed to Chair;Car;Floor;Furniture;Bed Mobility  OT Toilet;Tub/Shower     Locomotion: PT Ambulation;Stairs     Additional Impairments: OT Fuctional Use of Upper Extremity (right / dominant)  SLP Social Cognition   Problem Solving;Memory  TR      Anticipated Outcomes Item Anticipated Outcome  Self Feeding modified independent  Swallowing      Basic self-care  modified independent  Toileting  modified independent   Bathroom Transfers modified independent  Bowel/Bladder  Remain continent of bowel and bladder  Transfers  Mod I  Locomotion  Mod I  Communication     Cognition  Mod I  Pain  No c/o pain  Safety/Judgment  No falls    Therapy Plan: PT Intensity: Minimum of 1-2 x/day ,45 to 90 minutes PT Frequency: 5 out of 7 days PT Duration Estimated Length of Stay: 5-7 days OT Intensity: Minimum of 1-2 x/day, 45 to 90 minutes OT Duration/Estimated Length of Stay: 6-7 days SLP Intensity: Minumum of 1-2 x/day, 30 to 90 minutes SLP Frequency: 5 out of 7 days SLP Duration/Estimated Length of Stay: 5-7 days       Team Interventions: Nursing Interventions Patient/Family Education;Disease Management/Prevention;Pain Management;Psychosocial Support;Medication Management;Skin Care/Wound Management  PT interventions Ambulation/gait  training;Balance/vestibular training;Discharge planning;DME/adaptive equipment instruction;Functional mobility training;Neuromuscular re-education;Patient/family education;Stair training;Therapeutic Activities;Therapeutic Exercise;UE/LE Strength taining/ROM;UE/LE Coordination activities  OT Interventions Balance/vestibular training;Community reintegration;Disease mangement/prevention;Functional electrical stimulation;Neuromuscular re-education;Patient/family education;Self Care/advanced ADL retraining;Splinting/orthotics;Therapeutic Exercise;UE/LE Coordination activities;Discharge planning;DME/adaptive equipment instruction;Functional mobility training;Pain management;Psychosocial support;Therapeutic Activities;UE/LE Strength taining/ROM  SLP Interventions Cognitive remediation/compensation;Cueing hierarchy;Medication managment;Environmental controls;Functional tasks;Internal/external aids;Patient/family education  TR Interventions    SW/CM Interventions Discharge Planning;Psychosocial Support;Patient/Family Education    Team Discharge Planning: Destination: PT-Home ,OT- Home , SLP-Home Projected Follow-up: PT-Outpatient PT, OT-  Outpatient OT, SLP-Home Health SLP;Outpatient SLP Projected Equipment Needs: PT-None recommended by PT, OT- Tub/shower seat, SLP-None recommended by SLP Equipment Details: PT- , OT-  Patient/family involved in discharge planning: PT- Patient,  OT-Patient, SLP-Patient;Family member/caregiver  MD ELOS: [phone] Medical Rehab Prognosis:  Good Assessment: 80 y.o. right-handed female with history of hypertension--"no BP medication due to falls", chronic LBP and R-RTC problems with RUE weakness. Admitted 05/12/2013 with 4-5 day history of right-sided weakness, right facial numbness and difficulty walking. CT of the head showed focal oval area of low attenuation left parietal lobe deep white matter above the level of the ventricles. MRI showed acute infarct within the left  posterior frontal lobe, precentral gyrus. MRA of the head without stenosis     See Team Conference Notes for weekly updates to the plan of care

## 2013-05-16 NOTE — Progress Notes (Signed)
Subjective/Complaints: Was warm overnite otherwise no issues R shoulder pain chronic Review of Systems - Negative except R side weak  Objective: Vital Signs: Blood pressure 145/81, pulse 74, temperature 98.3 F (36.8 C), temperature source Oral, resp. rate 18, height '4\' 11"'  (1.499 m), weight 52.6 kg (115 lb 15.4 oz), SpO2 97.00%. Mr Brain Wo Contrast  05/14/2013   ADDENDUM REPORT: 05/14/2013 02:10  ADDENDUM: Acute findings discussed with and reconfirmed by Derrell Lolling, [facility] on May 14, 2013 at 2:10 a.m.   Electronically Signed   By: Elon Alas   On: 05/14/2013 02:10   05/14/2013   CLINICAL DATA:  Port to 5 days of right sided weakness, predominantly right upper extremity hemi paresis.  EXAM: MRI HEAD WITHOUT CONTRAST  MRA HEAD WITHOUT CONTRAST  TECHNIQUE: Multiplanar, multiecho pulse sequences of the brain and surrounding structures were obtained without intravenous contrast. Angiographic images of the head were obtained using MRA technique without contrast.  COMPARISON:  CT HEAD W/O CM dated 05/12/2013  FINDINGS: MRI HEAD FINDINGS  14 mm focus of reduced diffusion within the left posterior frontal lobe, precentral gyrus with corresponding low ADC values. No susceptibility artifact to suggest hemorrhage. However, there are a few scattered subcentimeter focus of susceptibility artifact predominantly central in distribution.  Moderate ventriculomegaly, likely on the basis of global parenchymal brain volume loss. Patchy to come: Predominately supratentorial T2 hyperintensities is seen, with mild patchy pontine T2 hyperintensities. Sub cm remote right cerebellar infarct in addition to patchy T2 hyperintensities within the basal ganglia and thalamus suggesting lacunar infarcts. No midline shift or mass effect.  No abnormal extra-axial fluid collections. Status post bilateral ocular lens implants. No paranasal sinus air-fluid levels, mastoid air cells are well aerated. Severe left  temporomandibular osteoarthrosis with small joint effusion. No abnormal sellar expansion. No cerebellar tonsillar ectopia. Somewhat bright T1 bone marrow signal may reflect osteopenia. There is mild sulcal effacement of the convexities, as seen on prior CT.  MRA HEAD FINDINGS  Anterior circulation: Normal flow related enhancement of the included cervical internal carotid arteries, as well as the petrous, cavernous and supra clinoid internal carotid arteries, mildly filled color ectatic intracranial vessels. Normal appearance of the anterior and middle cerebral arteries.  Posterior circulation: Left vertebral artery is mildly dominant with normal flow related enhancement of the vertebral arteries, vertebrobasilar junction, basilar artery, with a local ectasia. Tiny posterior communicating arteries tube are present. Normal flow early enhancement posterior cerebral arteries. Very slight irregularity of the posterior cerebral arteries.  No large vessel occlusion, hemodynamically significant stenosis, aneurysm, suspicious luminal irregularity.  IMPRESSION: MRI head: Acute 14 mm infarct within the left posterior frontal lobe, precentral gyrus (motor cortex) without hemorrhagic conversion.  Moderate ventriculomegaly, which may be predominately on the basis of brain atrophy, however there is mild sulcal effacement at the convexities which can be seen with normal pressure hydrocephalus.  Moderate to severe white matter changes most consistent with chronic small vessel ischemic disease with patchy bright T2 signal within the basal ganglia and thalamus likely reflecting lacunar infarcts. Scattered foci of susceptibility artifact suggest chronic hypertension.  MRA head: No hemodynamically significant stenosis. Dolichoectasia, which can be seen with chronic hypertension.  Minimal irregularity of the posterior cerebral artery suggest intracranial atherosclerosis.  Electronically Signed: By: Elon Alas On: 05/14/2013 02:05    Mr Jodene Nam Head/brain Wo Cm  05/14/2013   ADDENDUM REPORT: 05/14/2013 02:10  ADDENDUM: Acute findings discussed with and reconfirmed by Derrell Lolling, [facility] on May 14, 2013 at 2:10  a.m.   Electronically Signed   By: Elon Alas   On: 05/14/2013 02:10   05/14/2013   CLINICAL DATA:  Port to 5 days of right sided weakness, predominantly right upper extremity hemi paresis.  EXAM: MRI HEAD WITHOUT CONTRAST  MRA HEAD WITHOUT CONTRAST  TECHNIQUE: Multiplanar, multiecho pulse sequences of the brain and surrounding structures were obtained without intravenous contrast. Angiographic images of the head were obtained using MRA technique without contrast.  COMPARISON:  CT HEAD W/O CM dated 05/12/2013  FINDINGS: MRI HEAD FINDINGS  14 mm focus of reduced diffusion within the left posterior frontal lobe, precentral gyrus with corresponding low ADC values. No susceptibility artifact to suggest hemorrhage. However, there are a few scattered subcentimeter focus of susceptibility artifact predominantly central in distribution.  Moderate ventriculomegaly, likely on the basis of global parenchymal brain volume loss. Patchy to come: Predominately supratentorial T2 hyperintensities is seen, with mild patchy pontine T2 hyperintensities. Sub cm remote right cerebellar infarct in addition to patchy T2 hyperintensities within the basal ganglia and thalamus suggesting lacunar infarcts. No midline shift or mass effect.  No abnormal extra-axial fluid collections. Status post bilateral ocular lens implants. No paranasal sinus air-fluid levels, mastoid air cells are well aerated. Severe left temporomandibular osteoarthrosis with small joint effusion. No abnormal sellar expansion. No cerebellar tonsillar ectopia. Somewhat bright T1 bone marrow signal may reflect osteopenia. There is mild sulcal effacement of the convexities, as seen on prior CT.  MRA HEAD FINDINGS  Anterior circulation: Normal flow related enhancement of the included  cervical internal carotid arteries, as well as the petrous, cavernous and supra clinoid internal carotid arteries, mildly filled color ectatic intracranial vessels. Normal appearance of the anterior and middle cerebral arteries.  Posterior circulation: Left vertebral artery is mildly dominant with normal flow related enhancement of the vertebral arteries, vertebrobasilar junction, basilar artery, with a local ectasia. Tiny posterior communicating arteries tube are present. Normal flow early enhancement posterior cerebral arteries. Very slight irregularity of the posterior cerebral arteries.  No large vessel occlusion, hemodynamically significant stenosis, aneurysm, suspicious luminal irregularity.  IMPRESSION: MRI head: Acute 14 mm infarct within the left posterior frontal lobe, precentral gyrus (motor cortex) without hemorrhagic conversion.  Moderate ventriculomegaly, which may be predominately on the basis of brain atrophy, however there is mild sulcal effacement at the convexities which can be seen with normal pressure hydrocephalus.  Moderate to severe white matter changes most consistent with chronic small vessel ischemic disease with patchy bright T2 signal within the basal ganglia and thalamus likely reflecting lacunar infarcts. Scattered foci of susceptibility artifact suggest chronic hypertension.  MRA head: No hemodynamically significant stenosis. Dolichoectasia, which can be seen with chronic hypertension.  Minimal irregularity of the posterior cerebral artery suggest intracranial atherosclerosis.  Electronically Signed: By: Elon Alas On: 05/14/2013 02:05   Results for orders placed during the hospital encounter of 05/14/13 (from the past 72 hour(s))  URINALYSIS, ROUTINE W REFLEX MICROSCOPIC     Status: Abnormal   Collection Time    05/14/13 11:03 PM      Result Value Ref Range   Color, Urine YELLOW  YELLOW   APPearance CLEAR  CLEAR   Specific Gravity, Urine 1.012  1.005 - 1.030   pH 6.0   5.0 - 8.0   Glucose, UA NEGATIVE  NEGATIVE mg/dL   Hgb urine dipstick TRACE (*) NEGATIVE   Bilirubin Urine NEGATIVE  NEGATIVE   Ketones, ur NEGATIVE  NEGATIVE mg/dL   Protein, ur NEGATIVE  NEGATIVE mg/dL  Urobilinogen, UA 0.2  0.0 - 1.0 mg/dL   Nitrite NEGATIVE  NEGATIVE   Leukocytes, UA MODERATE (*) NEGATIVE  URINE MICROSCOPIC-ADD ON     Status: None   Collection Time    05/14/13 11:03 PM      Result Value Ref Range   Squamous Epithelial / LPF RARE  RARE   WBC, UA 21-50  <3 WBC/hpf   RBC / HPF 0-2  <3 RBC/hpf   Bacteria, UA RARE  RARE  CBC WITH DIFFERENTIAL     Status: Abnormal   Collection Time    05/15/13  5:00 AM      Result Value Ref Range   WBC 6.4  4.0 - 10.5 K/uL   RBC 4.25  3.87 - 5.11 MIL/uL   Hemoglobin 13.4  12.0 - 15.0 g/dL   HCT 39.1  36.0 - 46.0 %   MCV 92.0  78.0 - 100.0 fL   MCH 31.5  26.0 - 34.0 pg   MCHC 34.3  30.0 - 36.0 g/dL   RDW 14.0  11.5 - 15.5 %   Platelets 287  150 - 400 K/uL   Neutrophils Relative % 42 (*) 43 - 77 %   Neutro Abs 2.7  1.7 - 7.7 K/uL   Lymphocytes Relative 41  12 - 46 %   Lymphs Abs 2.6  0.7 - 4.0 K/uL   Monocytes Relative 14 (*) 3 - 12 %   Monocytes Absolute 0.9  0.1 - 1.0 K/uL   Eosinophils Relative 3  0 - 5 %   Eosinophils Absolute 0.2  0.0 - 0.7 K/uL   Basophils Relative 1  0 - 1 %   Basophils Absolute 0.0  0.0 - 0.1 K/uL  COMPREHENSIVE METABOLIC PANEL     Status: Abnormal   Collection Time    05/15/13  5:00 AM      Result Value Ref Range   Sodium 138  137 - 147 mEq/L   Potassium 4.5  3.7 - 5.3 mEq/L   Chloride 98  96 - 112 mEq/L   CO2 25  19 - 32 mEq/L   Glucose, Bld 96  70 - 99 mg/dL   BUN 20  6 - 23 mg/dL   Creatinine, Ser 0.81  0.50 - 1.10 mg/dL   Calcium 9.7  8.4 - 10.5 mg/dL   Total Protein 6.8  6.0 - 8.3 g/dL   Albumin 3.3 (*) 3.5 - 5.2 g/dL   AST 16  0 - 37 U/L   ALT 9  0 - 35 U/L   Alkaline Phosphatase 79  39 - 117 U/L   Total Bilirubin 0.2 (*) 0.3 - 1.2 mg/dL   GFR calc non Af Amer 68 (*) >90 mL/min    GFR calc Af Amer 79 (*) >90 mL/min   Comment: (NOTE)     The eGFR has been calculated using the CKD EPI equation.     This calculation has not been validated in all clinical situations.     eGFR's persistently <90 mL/min signify possible Chronic Kidney     Disease.      Head: Normocephalic.  Eyes: EOM are normal.  Neck: Normal range of motion. Neck supple. No thyromegaly present.  Cardiovascular: Normal rate and regular rhythm.  Respiratory: Effort normal and breath sounds normal. No respiratory distress.  GI: Soft. Bowel sounds are normal. She exhibits no distension.  Neurological: She is alert and oriented to person, place, and time.  Makes good eye contact with examiner. Follows all  commands  Skin: Skin is warm and dry.  Psychiatric: She has a normal mood and affect.  motor strength is 2 minus in the right deltoid, bicep, tricep, grip, finger flexors and extensors  4 minus in the right hip flexor knee extensors 3 minus at the ankle dorsiflexor plantar flexor  5/5 in the left deltoid, bicep and tricep, grip, hip flexor, knee extension, ankle dorsiflexor and plantar flexor  Sensation reduced to light touch on the right side upper greater than lower extremity. Is able to distinguish which finger was touched  No visual field cuts   Assessment/Plan: 1. Functional deficits secondary to Left post frontal infarct which require 3+ hours per day of interdisciplinary therapy in a comprehensive inpatient rehab setting. Physiatrist is providing close team supervision and 24 hour management of active medical problems listed below. Physiatrist and rehab team continue to assess barriers to discharge/monitor patient progress toward functional and medical goals. FIM: FIM - Bathing Bathing Steps Patient Completed: Chest;Right Arm;Abdomen;Front perineal area;Buttocks;Right upper leg;Left upper leg Bathing: 3: Mod-Patient completes 5-7 [id number] parts or 50-74%  FIM - Upper Body  Dressing/Undressing Upper body dressing/undressing steps patient completed: Thread/unthread right sleeve of front closure shirt/dress;Thread/unthread left sleeve of front closure shirt/dress;Pull shirt around back of front closure shirt/dress Upper body dressing/undressing: 4: Min-Patient completed 75 plus % of tasks FIM - Lower Body Dressing/Undressing Lower body dressing/undressing steps patient completed: Thread/unthread right underwear leg;Thread/unthread left underwear leg;Thread/unthread right pants leg;Thread/unthread left pants leg;Don/Doff left shoe Lower body dressing/undressing: 3: Mod-Patient completed 50-74% of tasks  FIM - Toileting Toileting steps completed by patient: Adjust clothing prior to toileting;Performs perineal hygiene Toileting: 3: Mod-Patient completed 2 of 3 steps  FIM - TRadio producerDevices: Grab bars Toilet Transfers: 4-To toilet/BSC: Min A (steadying Pt. > 75%);4-From toilet/BSC: Min A (steadying Pt. > 75%)  FIM - Bed/Chair Transfer Bed/Chair Transfer Assistive Devices: Arm rests Bed/Chair Transfer: 4: Bed > Chair or W/C: Min A (steadying Pt. > 75%);4: Chair or W/C > Bed: Min A (steadying Pt. > 75%);5: Sit > Supine: Supervision (verbal cues/safety issues)  FIM - Locomotion: Wheelchair Distance: 150 Locomotion: Wheelchair: 1: Total Assistance/staff pushes wheelchair (Pt<25%) FIM - Locomotion: Ambulation Locomotion: Ambulation Assistive Devices: Other (comment) (none) Ambulation/Gait Assistance: 4: Min assist Locomotion: Ambulation: 2: Travels 50 - 149 ft with minimal assistance (Pt.>75%)  Comprehension Comprehension Mode: Auditory Comprehension: 7-Follows complex conversation/direction: With no assist  Expression Expression Mode: Verbal Expression: 7-Expresses complex ideas: With no assist  Social Interaction Social Interaction: 7-Interacts appropriately with others - No medications needed.  Problem Solving Problem  Solving: 6-Solves complex problems: With extra time  Memory Memory: 5-Recognizes or recalls 90% of the time/requires cueing < 10% of the time  Medical Problem List and Plan:  1. DVT Prophylaxis/Anticoagulation: Pharmaceutical: Lovenox  2. Pain Management: Has used meloxicam and cymbalta in the past had stopped taking meds since Husband's death. Willing to resume cymbalta as that has helped with neuropathy in the past. Will use tylenol prn. Add voltaren gel for shoulder pain.  3. Mood: Denies depressed mood. LCSW to follow for evaluation.  4. Neuropsych: This patient is capable of making decisions on her own behalf.  5. HTN: Will monitor with bid checks. Monitor for orthostasis.  6. Dyslipidemia: zocor.     LOS (Days) 2 A FACE TO FACE EVALUATION WAS PERFORMED  Sabatino Williard E 05/16/2013, 7:25 AM

## 2013-05-16 NOTE — Progress Notes (Signed)
Occupational Therapy Session Note  Patient Details  Name: [name] MRN: [id number] Date of Birth: 1934-02-12  Today's Date: 05/16/2013 Time: [id number] Time Calculation (min): 60 min  Short Term Goals: Week 1:    STG = LTGs  Skilled Therapeutic Interventions/Progress Updates:    Engaged in ADL retraining with focus on functional mobility, standing balance, and use of RUE during self-care tasks of grooming, bathing, and dressing.  Pt seated on EOB finishing breakfast upon arrival.  Ambulated to bathroom to complete toileting and bathing at sit > stand level.  Pt min/steady assist with transfers this session.  Pt did not incorporate RUE into bathing spontaneously, however utilized RUE as steady assist during grooming tasks, attempts to button shirt, and pull up pants with min verbal cues. Pt reports not having full use of RUE PTA due to rotator cuff injury and repair.  Therapy Documentation Precautions:  Precautions Precautions: Fall Precaution Comments: history of rotator cuff repair and low back pain Required Braces or Orthoses:  (sling issued for increased support to right upper extremity) Restrictions Weight Bearing Restrictions: No General:   Vital Signs: Therapy Vitals BP: 139/80 mmHg Pain: Pain Assessment Pain Assessment: No/denies pain Pain Score: 0-No pain  See FIM for current functional status  Therapy/Group: Individual Therapy  Simonne Come 05/16/2013, 11:38 AM

## 2013-05-17 ENCOUNTER — Inpatient Hospital Stay (HOSPITAL_COMMUNITY): Payer: Medicare Other | Admitting: Occupational Therapy

## 2013-05-17 ENCOUNTER — Inpatient Hospital Stay (HOSPITAL_COMMUNITY): Payer: Medicare Other | Admitting: Physical Therapy

## 2013-05-17 ENCOUNTER — Inpatient Hospital Stay (HOSPITAL_COMMUNITY): Payer: Medicare Other | Admitting: Speech Pathology

## 2013-05-17 NOTE — Progress Notes (Signed)
Physical Therapy Session Note  Patient Details  Name: Kathy Calhoun MRN: [id number] Date of Birth: 11/19/1933  Today's Date: 05/17/2013 Time: 1102-1157 Time Calculation (min): 55 min  Short Term Goals: Week 1:  PT Short Term Goal 1 (Week 1): = LTG secondary to short LOS  Skilled Therapeutic Interventions/Progress Updates:  Pt was seen bedside in the am. Pt transfers sit to stand with S and SPC. Pt ambulated about 200 feet with Giv-mor sling R UE and SPC, min guard with slow cadence occasional verbal cues for technique. In gym, treatment focused on LE strengthening and NMR to increased independence with ambulation. Pt ambulated back to room about 200 feet with SPC and min guard, occasional verbal cues for technique.   Therapy Documentation Precautions:  Precautions Precautions: Fall Precaution Comments: history of rotator cuff repair and low back pain Required Braces or Orthoses:  (sling issued for increased support to right upper extremity) Restrictions Weight Bearing Restrictions: No General:   Pain: Pt c/o pain 6/10 R shoulder.    Locomotion : Ambulation Ambulation/Gait Assistance: 4: Min guard   See FIM for current functional status  Therapy/Group: Individual Therapy  [name], [name] 05/17/2013, 2:09 PM

## 2013-05-17 NOTE — Progress Notes (Signed)
Occupational Therapy Session Note  Patient Details  Name: MEILAH DELROSARIO MRN: [id number] Date of Birth: 1933/09/30  Today's Date: 05/17/2013 Time: [phone] and 1400-1445 Time Calculation (min): 56 min and 45 min  Short Term Goals: No short term goals set  Skilled Therapeutic Interventions/Progress Updates:    1) Engaged in ADL retraining with focus on functional mobility, standing balance, and use of RUE during self-care tasks of grooming, bathing, and dressing. Pt seated on EOB finishing breakfast upon arrival. Ambulated to bathroom without AD to complete toileting and bathing at sit > stand level. Pt close supervision with transfers this session. Pt with increased functional use of RUE with self-care tasks,mostly as steady assist during grooming tasks and pulling up pants without any cues. Pt reports "this hand is working a little better today".  Pt with good isolated movements, however lacks strength and coordination to incorporate functionally.  2) Engaged in [place] re-ed with focus on coordinating AROM in shoulder, elbow, forearm, wrist, and fingers.  Pt ambulated to therapy gym without AD with supervision.  In unsupported sitting incorporated UE Ranger with focus on shoulder flexion and elbow extension with pt able to achieve approx 30 degrees shoulder flexion, when UE Ranger removed pt with ~5 degrees shoulder flexion before requiring trunk compensation.  Rainbow arc in sitting with support at elbow to promote increased shoulder flexion and horizontal abduction/adduction.  Followed by peg board activity with pt with loose gross grasp of pegs but able to manipulate wrist, supination/pronation to place pegs in board with increased time.  Pt motivated by increased functional movement this session.  Pt returned to room and requested to lie down in bed.  Therapy Documentation Precautions:  Precautions Precautions: Fall Precaution Comments: history of rotator cuff repair and low back pain Required  Braces or Orthoses:  (sling issued for increased support to right upper extremity) Restrictions Weight Bearing Restrictions: No General:   Vital Signs: Therapy Vitals Temp: 98.3 F (36.8 C) Temp src: Oral Pulse Rate: 64 Resp: 16 BP: 137/77 mmHg Patient Position, if appropriate: Lying Oxygen Therapy SpO2: 96 % O2 Device: None (Room air) Pain:  Pt with no c/o pain  See FIM for current functional status  Therapy/Group: Individual Therapy  Simonne Come 05/17/2013, 8:59 AM

## 2013-05-17 NOTE — Progress Notes (Signed)
Subjective/Complaints: Was warm overnite otherwise no issues R shoulder pain chronic Review of Systems - Negative except R side weak  Objective: Vital Signs: Blood pressure 137/77, pulse 64, temperature 98.3 F (36.8 C), temperature source Oral, resp. rate 16, height '4\' 11"'  (1.499 m), weight 52.6 kg (115 lb 15.4 oz), SpO2 96.00%. Mr Brain Wo Contrast  05/14/2013   ADDENDUM REPORT: 05/14/2013 02:10  ADDENDUM: Acute findings discussed with and reconfirmed by Derrell Lolling, [facility] on May 14, 2013 at 2:10 a.m.   Electronically Signed   By: Elon Alas   On: 05/14/2013 02:10   05/14/2013   CLINICAL DATA:  Port to 5 days of right sided weakness, predominantly right upper extremity hemi paresis.  EXAM: MRI HEAD WITHOUT CONTRAST  MRA HEAD WITHOUT CONTRAST  TECHNIQUE: Multiplanar, multiecho pulse sequences of the brain and surrounding structures were obtained without intravenous contrast. Angiographic images of the head were obtained using MRA technique without contrast.  COMPARISON:  CT HEAD W/O CM dated 05/12/2013  FINDINGS: MRI HEAD FINDINGS  14 mm focus of reduced diffusion within the left posterior frontal lobe, precentral gyrus with corresponding low ADC values. No susceptibility artifact to suggest hemorrhage. However, there are a few scattered subcentimeter focus of susceptibility artifact predominantly central in distribution.  Moderate ventriculomegaly, likely on the basis of global parenchymal brain volume loss. Patchy to come: Predominately supratentorial T2 hyperintensities is seen, with mild patchy pontine T2 hyperintensities. Sub cm remote right cerebellar infarct in addition to patchy T2 hyperintensities within the basal ganglia and thalamus suggesting lacunar infarcts. No midline shift or mass effect.  No abnormal extra-axial fluid collections. Status post bilateral ocular lens implants. No paranasal sinus air-fluid levels, mastoid air cells are well aerated. Severe left  temporomandibular osteoarthrosis with small joint effusion. No abnormal sellar expansion. No cerebellar tonsillar ectopia. Somewhat bright T1 bone marrow signal may reflect osteopenia. There is mild sulcal effacement of the convexities, as seen on prior CT.  MRA HEAD FINDINGS  Anterior circulation: Normal flow related enhancement of the included cervical internal carotid arteries, as well as the petrous, cavernous and supra clinoid internal carotid arteries, mildly filled color ectatic intracranial vessels. Normal appearance of the anterior and middle cerebral arteries.  Posterior circulation: Left vertebral artery is mildly dominant with normal flow related enhancement of the vertebral arteries, vertebrobasilar junction, basilar artery, with a local ectasia. Tiny posterior communicating arteries tube are present. Normal flow early enhancement posterior cerebral arteries. Very slight irregularity of the posterior cerebral arteries.  No large vessel occlusion, hemodynamically significant stenosis, aneurysm, suspicious luminal irregularity.  IMPRESSION: MRI head: Acute 14 mm infarct within the left posterior frontal lobe, precentral gyrus (motor cortex) without hemorrhagic conversion.  Moderate ventriculomegaly, which may be predominately on the basis of brain atrophy, however there is mild sulcal effacement at the convexities which can be seen with normal pressure hydrocephalus.  Moderate to severe white matter changes most consistent with chronic small vessel ischemic disease with patchy bright T2 signal within the basal ganglia and thalamus likely reflecting lacunar infarcts. Scattered foci of susceptibility artifact suggest chronic hypertension.  MRA head: No hemodynamically significant stenosis. Dolichoectasia, which can be seen with chronic hypertension.  Minimal irregularity of the posterior cerebral artery suggest intracranial atherosclerosis.  Electronically Signed: By: Elon Alas On: 05/14/2013 02:05    Mr Jodene Nam Head/brain Wo Cm  05/14/2013   ADDENDUM REPORT: 05/14/2013 02:10  ADDENDUM: Acute findings discussed with and reconfirmed by Derrell Lolling, [facility] on May 14, 2013 at 2:10  a.m.   Electronically Signed   By: Elon Alas   On: 05/14/2013 02:10   05/14/2013   CLINICAL DATA:  Port to 5 days of right sided weakness, predominantly right upper extremity hemi paresis.  EXAM: MRI HEAD WITHOUT CONTRAST  MRA HEAD WITHOUT CONTRAST  TECHNIQUE: Multiplanar, multiecho pulse sequences of the brain and surrounding structures were obtained without intravenous contrast. Angiographic images of the head were obtained using MRA technique without contrast.  COMPARISON:  CT HEAD W/O CM dated 05/12/2013  FINDINGS: MRI HEAD FINDINGS  14 mm focus of reduced diffusion within the left posterior frontal lobe, precentral gyrus with corresponding low ADC values. No susceptibility artifact to suggest hemorrhage. However, there are a few scattered subcentimeter focus of susceptibility artifact predominantly central in distribution.  Moderate ventriculomegaly, likely on the basis of global parenchymal brain volume loss. Patchy to come: Predominately supratentorial T2 hyperintensities is seen, with mild patchy pontine T2 hyperintensities. Sub cm remote right cerebellar infarct in addition to patchy T2 hyperintensities within the basal ganglia and thalamus suggesting lacunar infarcts. No midline shift or mass effect.  No abnormal extra-axial fluid collections. Status post bilateral ocular lens implants. No paranasal sinus air-fluid levels, mastoid air cells are well aerated. Severe left temporomandibular osteoarthrosis with small joint effusion. No abnormal sellar expansion. No cerebellar tonsillar ectopia. Somewhat bright T1 bone marrow signal may reflect osteopenia. There is mild sulcal effacement of the convexities, as seen on prior CT.  MRA HEAD FINDINGS  Anterior circulation: Normal flow related enhancement of the included  cervical internal carotid arteries, as well as the petrous, cavernous and supra clinoid internal carotid arteries, mildly filled color ectatic intracranial vessels. Normal appearance of the anterior and middle cerebral arteries.  Posterior circulation: Left vertebral artery is mildly dominant with normal flow related enhancement of the vertebral arteries, vertebrobasilar junction, basilar artery, with a local ectasia. Tiny posterior communicating arteries tube are present. Normal flow early enhancement posterior cerebral arteries. Very slight irregularity of the posterior cerebral arteries.  No large vessel occlusion, hemodynamically significant stenosis, aneurysm, suspicious luminal irregularity.  IMPRESSION: MRI head: Acute 14 mm infarct within the left posterior frontal lobe, precentral gyrus (motor cortex) without hemorrhagic conversion.  Moderate ventriculomegaly, which may be predominately on the basis of brain atrophy, however there is mild sulcal effacement at the convexities which can be seen with normal pressure hydrocephalus.  Moderate to severe white matter changes most consistent with chronic small vessel ischemic disease with patchy bright T2 signal within the basal ganglia and thalamus likely reflecting lacunar infarcts. Scattered foci of susceptibility artifact suggest chronic hypertension.  MRA head: No hemodynamically significant stenosis. Dolichoectasia, which can be seen with chronic hypertension.  Minimal irregularity of the posterior cerebral artery suggest intracranial atherosclerosis.  Electronically Signed: By: Elon Alas On: 05/14/2013 02:05   Results for orders placed during the hospital encounter of 05/14/13 (from the past 72 hour(s))  URINALYSIS, ROUTINE W REFLEX MICROSCOPIC     Status: Abnormal   Collection Time    05/14/13 11:03 PM      Result Value Ref Range   Color, Urine YELLOW  YELLOW   APPearance CLEAR  CLEAR   Specific Gravity, Urine 1.012  1.005 - 1.030   pH 6.0   5.0 - 8.0   Glucose, UA NEGATIVE  NEGATIVE mg/dL   Hgb urine dipstick TRACE (*) NEGATIVE   Bilirubin Urine NEGATIVE  NEGATIVE   Ketones, ur NEGATIVE  NEGATIVE mg/dL   Protein, ur NEGATIVE  NEGATIVE mg/dL  Urobilinogen, UA 0.2  0.0 - 1.0 mg/dL   Nitrite NEGATIVE  NEGATIVE   Leukocytes, UA MODERATE (*) NEGATIVE  URINE CULTURE     Status: None   Collection Time    05/14/13 11:03 PM      Result Value Ref Range   Specimen Description URINE, CLEAN CATCH     Special Requests NONE     Culture  Setup Time       Value: 05/15/2013 05:07     Performed at [facility]       Value: >=100,000 COLONIES/ML     Performed at [facility]   Culture       Value: Multiple bacterial morphotypes present, none predominant. Suggest appropriate recollection if clinically indicated.     Performed at [facility]   Report Status 05/16/2013 FINAL    URINE MICROSCOPIC-ADD ON     Status: None   Collection Time    05/14/13 11:03 PM      Result Value Ref Range   Squamous Epithelial / LPF RARE  RARE   WBC, UA 21-50  <3 WBC/hpf   RBC / HPF 0-2  <3 RBC/hpf   Bacteria, UA RARE  RARE  CBC WITH DIFFERENTIAL     Status: Abnormal   Collection Time    05/15/13  5:00 AM      Result Value Ref Range   WBC 6.4  4.0 - 10.5 K/uL   RBC 4.25  3.87 - 5.11 MIL/uL   Hemoglobin 13.4  12.0 - 15.0 g/dL   HCT 39.1  36.0 - 46.0 %   MCV 92.0  78.0 - 100.0 fL   MCH 31.5  26.0 - 34.0 pg   MCHC 34.3  30.0 - 36.0 g/dL   RDW 14.0  11.5 - 15.5 %   Platelets 287  150 - 400 K/uL   Neutrophils Relative % 42 (*) 43 - 77 %   Neutro Abs 2.7  1.7 - 7.7 K/uL   Lymphocytes Relative 41  12 - 46 %   Lymphs Abs 2.6  0.7 - 4.0 K/uL   Monocytes Relative 14 (*) 3 - 12 %   Monocytes Absolute 0.9  0.1 - 1.0 K/uL   Eosinophils Relative 3  0 - 5 %   Eosinophils Absolute 0.2  0.0 - 0.7 K/uL   Basophils Relative 1  0 - 1 %   Basophils Absolute 0.0  0.0 - 0.1 K/uL  COMPREHENSIVE METABOLIC PANEL     Status:  Abnormal   Collection Time    05/15/13  5:00 AM      Result Value Ref Range   Sodium 138  137 - 147 mEq/L   Potassium 4.5  3.7 - 5.3 mEq/L   Chloride 98  96 - 112 mEq/L   CO2 25  19 - 32 mEq/L   Glucose, Bld 96  70 - 99 mg/dL   BUN 20  6 - 23 mg/dL   Creatinine, Ser 0.81  0.50 - 1.10 mg/dL   Calcium 9.7  8.4 - 10.5 mg/dL   Total Protein 6.8  6.0 - 8.3 g/dL   Albumin 3.3 (*) 3.5 - 5.2 g/dL   AST 16  0 - 37 U/L   ALT 9  0 - 35 U/L   Alkaline Phosphatase 79  39 - 117 U/L   Total Bilirubin 0.2 (*) 0.3 - 1.2 mg/dL   GFR calc non Af Amer 68 (*) >90 mL/min   GFR  calc Af Amer 79 (*) >90 mL/min   Comment: (NOTE)     The eGFR has been calculated using the CKD EPI equation.     This calculation has not been validated in all clinical situations.     eGFR's persistently <90 mL/min signify possible Chronic Kidney     Disease.      Head: Normocephalic.  Eyes: EOM are normal.  Neck: Normal range of motion. Neck supple. No thyromegaly present.  Cardiovascular: Normal rate and regular rhythm.  Respiratory: Effort normal and breath sounds normal. No respiratory distress.  GI: Soft. Bowel sounds are normal. She exhibits no distension.  Neurological: She is alert and oriented to person, place, and time.  Makes good eye contact with examiner. Follows all commands  Skin: Skin is warm and dry.  Psychiatric: She has a normal mood and affect.  motor strength is 3 minus in the right deltoid, bicep, tricep, grip, finger flexors and extensors  4 minus in the right hip flexor knee extensors 3 minus at the ankle dorsiflexor plantar flexor  5/5 in the left deltoid, bicep and tricep, grip, hip flexor, knee extension, ankle dorsiflexor and plantar flexor   No visual field cuts   Assessment/Plan: 1. Functional deficits secondary to Left post frontal infarct which require 3+ hours per day of interdisciplinary therapy in a comprehensive inpatient rehab setting. Physiatrist is providing close team  supervision and 24 hour management of active medical problems listed below. Physiatrist and rehab team continue to assess barriers to discharge/monitor patient progress toward functional and medical goals. FIM: FIM - Bathing Bathing Steps Patient Completed: Chest;Right Arm;Abdomen;Front perineal area;Buttocks;Right upper leg;Left upper leg;Right lower leg (including foot);Left lower leg (including foot) Bathing: 4: Min-Patient completes 8-9 [id number] parts or 75+ percent  FIM - Upper Body Dressing/Undressing Upper body dressing/undressing steps patient completed: Thread/unthread right sleeve of front closure shirt/dress;Thread/unthread left sleeve of front closure shirt/dress;Pull shirt around back of front closure shirt/dress Upper body dressing/undressing: 4: Min-Patient completed 75 plus % of tasks FIM - Lower Body Dressing/Undressing Lower body dressing/undressing steps patient completed: Thread/unthread right underwear leg;Thread/unthread left underwear leg;Thread/unthread right pants leg;Thread/unthread left pants leg;Pull underwear up/down;Pull pants up/down;Don/Doff right sock;Don/Doff left sock;Don/Doff right shoe;Don/Doff left shoe Lower body dressing/undressing: 4: Steadying Assist  FIM - Toileting Toileting steps completed by patient: Adjust clothing prior to toileting;Performs perineal hygiene;Adjust clothing after toileting Toileting Assistive Devices: Grab bar or rail for support Toileting: 4: Steadying assist  FIM - TRadio producerDevices: Grab bars Toilet Transfers: 5-To toilet/BSC: Supervision (verbal cues/safety issues);5-From toilet/BSC: Supervision (verbal cues/safety issues)  FIM - BEngineer, siteAssistive Devices: Arm rests Bed/Chair Transfer: 5: Bed > Chair or W/C: Supervision (verbal cues/safety issues);5: Chair or W/C > Bed: Supervision (verbal cues/safety issues)  FIM - Locomotion: Wheelchair Distance:  150 Locomotion: Wheelchair: 0: Activity did not occur FIM - Locomotion: Ambulation Locomotion: Ambulation Assistive Devices: Other (comment) (none) Ambulation/Gait Assistance: 4: Min assist Locomotion: Ambulation: 4: Travels 150 ft or more with minimal assistance (Pt.>75%)  Comprehension Comprehension Mode: Auditory Comprehension: 7-Follows complex conversation/direction: With no assist  Expression Expression Mode: Verbal Expression: 7-Expresses complex ideas: With no assist  Social Interaction Social Interaction: 6-Interacts appropriately with others with medication or extra time (anti-anxiety, antidepressant).  Problem Solving Problem Solving: 5-Solves complex 90% of the time/cues < 10% of the time  Memory Memory: 5-Recognizes or recalls 90% of the time/requires cueing < 10% of the time  Medical Problem List and Plan:  1. DVT Prophylaxis/Anticoagulation:  Pharmaceutical: Lovenox  2. Pain Management: Has used meloxicam and cymbalta in the past had stopped taking meds since Husband's death. Willing to resume cymbalta as that has helped with neuropathy in the past. Will use tylenol prn. Add voltaren gel for shoulder pain.  3. Mood: Denies depressed mood. LCSW to follow for evaluation.  4. Neuropsych: This patient is capable of making decisions on her own behalf.  5. HTN: Will monitor with bid checks. Monitor for orthostasis.  6. Dyslipidemia: zocor.     LOS (Days) 3 A FACE TO FACE EVALUATION WAS PERFORMED  Zidane Renner E 05/17/2013, 6:58 AM

## 2013-05-17 NOTE — Progress Notes (Signed)
Speech Language Pathology Daily Session Note  Patient Details  Name: Kathy Calhoun MRN: [id number] Date of Birth: October 06, 1933  Today's Date: 05/17/2013 Time: 1300-1330 Time Calculation (min): 30 min  Short Term Goals: Week 1: SLP Short Term Goal 1 (Week 1): Pt will demonstrate adequate medication management with Mod I SLP Short Term Goal 2 (Week 1): Pt will demonstrate complex problem solving during functional task with Mod I SLP Short Term Goal 3 (Week 1): Pt will use external memory aids to recall new and daily information with Mod I  Skilled Therapeutic Interventions: Skilled ST intervention provided with focus on cognitive goals. Pt seen in room for ST tx, participated in all tasks willingly. Pt stated that she previously administered and separated her own medications, at home. Slp introduced medication organizer and provided thorough explanation along with visual model of how it works. Slp reviewed pt's current medication list, including dosage instructions and purpose. Pt organized medications into organizer with 90% accuracy, independent. Pt independently double-checked her work and self-corrected x1.    FIM:  Comprehension Comprehension Mode: Auditory Comprehension: 6-Follows complex conversation/direction: With extra time/assistive device Expression Expression Mode: Verbal Expression: 7-Expresses complex ideas: With no assist Social Interaction Social Interaction: 6-Interacts appropriately with others with medication or extra time (anti-anxiety, antidepressant). Problem Solving Problem Solving: 5-Solves complex 90% of the time/cues < 10% of the time Memory Memory: 5-Recognizes or recalls 90% of the time/requires cueing < 10% of the time  Pain Pain Assessment Pain Assessment: No/denies pain  Therapy/Group: Individual Therapy  Lisa-Marie Rueger, [facility] 05/17/2013, 4:08 PM

## 2013-05-18 NOTE — Progress Notes (Signed)
Subjective/Complaints: No problems yest  R shoulder pain chronic Review of Systems - Negative except R side weak  Objective: Vital Signs: Blood pressure 143/74, pulse 67, temperature 97.8 F (36.6 C), temperature source Oral, resp. rate 18, height 4\' 11"  (1.499 m), weight 52.6 kg (115 lb 15.4 oz), SpO2 97.00%. Kathy Calhoun  05/14/2013   ADDENDUM REPORT: 05/14/2013 02:10  ADDENDUM: Acute findings discussed with and reconfirmed by Kathy Calhoun, [facility] on May 14, 2013 at 2:10 a.m.   Electronically Signed   By: Kathy Calhoun   On: 05/14/2013 02:10   05/14/2013   CLINICAL DATA:  Port to 5 days of right sided weakness, predominantly right upper extremity hemi paresis.  EXAM: MRI HEAD WITHOUT Calhoun  MRA HEAD WITHOUT Calhoun  TECHNIQUE: Multiplanar, multiecho pulse sequences of the brain and surrounding structures were obtained without intravenous Calhoun. Angiographic images of the head were obtained using MRA technique without Calhoun.  COMPARISON:  CT HEAD W/O CM dated 05/12/2013  FINDINGS: MRI HEAD FINDINGS  14 mm focus of reduced diffusion within the left posterior frontal lobe, precentral gyrus with corresponding low ADC values. No susceptibility artifact to suggest hemorrhage. However, there are a few scattered subcentimeter focus of susceptibility artifact predominantly central in distribution.  Moderate ventriculomegaly, likely on the basis of global parenchymal brain volume loss. Patchy to come: Predominately supratentorial T2 hyperintensities is seen, with mild patchy pontine T2 hyperintensities. Sub cm remote right cerebellar infarct in addition to patchy T2 hyperintensities within the basal ganglia and thalamus suggesting lacunar infarcts. No midline shift or mass effect.  No abnormal extra-axial fluid collections. Status post bilateral ocular lens implants. No paranasal sinus air-fluid levels, mastoid air cells are well aerated. Severe left temporomandibular  osteoarthrosis with small joint effusion. No abnormal sellar expansion. No cerebellar tonsillar ectopia. Somewhat bright T1 bone marrow signal may reflect osteopenia. There is mild sulcal effacement of the convexities, as seen on prior CT.  MRA HEAD FINDINGS  Anterior circulation: Normal flow related enhancement of the included cervical internal carotid arteries, as well as the petrous, cavernous and supra clinoid internal carotid arteries, mildly filled color ectatic intracranial vessels. Normal appearance of the anterior and middle cerebral arteries.  Posterior circulation: Left vertebral artery is mildly dominant with normal flow related enhancement of the vertebral arteries, vertebrobasilar junction, basilar artery, with a local ectasia. Tiny posterior communicating arteries tube are present. Normal flow early enhancement posterior cerebral arteries. Very slight irregularity of the posterior cerebral arteries.  No large vessel occlusion, hemodynamically significant stenosis, aneurysm, suspicious luminal irregularity.  IMPRESSION: MRI head: Acute 14 mm infarct within the left posterior frontal lobe, precentral gyrus (motor cortex) without hemorrhagic conversion.  Moderate ventriculomegaly, which may be predominately on the basis of brain atrophy, however there is mild sulcal effacement at the convexities which can be seen with normal pressure hydrocephalus.  Moderate to severe white matter changes most consistent with chronic small vessel ischemic disease with patchy bright T2 signal within the basal ganglia and thalamus likely reflecting lacunar infarcts. Scattered foci of susceptibility artifact suggest chronic hypertension.  MRA head: No hemodynamically significant stenosis. Dolichoectasia, which can be seen with chronic hypertension.  Minimal irregularity of the posterior cerebral artery suggest intracranial atherosclerosis.  Electronically Signed: By: Kathy Calhoun On: 05/14/2013 02:05   Kathy Calhoun  Head/brain Wo Cm  05/14/2013   ADDENDUM REPORT: 05/14/2013 02:10  ADDENDUM: Acute findings discussed with and reconfirmed by Kathy Calhoun, [facility] on May 14, 2013 at 2:10 a.m.  Electronically Signed   By: Kathy Calhoun   On: 05/14/2013 02:10   05/14/2013   CLINICAL DATA:  Port to 5 days of right sided weakness, predominantly right upper extremity hemi paresis.  EXAM: MRI HEAD WITHOUT Calhoun  MRA HEAD WITHOUT Calhoun  TECHNIQUE: Multiplanar, multiecho pulse sequences of the brain and surrounding structures were obtained without intravenous Calhoun. Angiographic images of the head were obtained using MRA technique without Calhoun.  COMPARISON:  CT HEAD W/O CM dated 05/12/2013  FINDINGS: MRI HEAD FINDINGS  14 mm focus of reduced diffusion within the left posterior frontal lobe, precentral gyrus with corresponding low ADC values. No susceptibility artifact to suggest hemorrhage. However, there are a few scattered subcentimeter focus of susceptibility artifact predominantly central in distribution.  Moderate ventriculomegaly, likely on the basis of global parenchymal brain volume loss. Patchy to come: Predominately supratentorial T2 hyperintensities is seen, with mild patchy pontine T2 hyperintensities. Sub cm remote right cerebellar infarct in addition to patchy T2 hyperintensities within the basal ganglia and thalamus suggesting lacunar infarcts. No midline shift or mass effect.  No abnormal extra-axial fluid collections. Status post bilateral ocular lens implants. No paranasal sinus air-fluid levels, mastoid air cells are well aerated. Severe left temporomandibular osteoarthrosis with small joint effusion. No abnormal sellar expansion. No cerebellar tonsillar ectopia. Somewhat bright T1 bone marrow signal may reflect osteopenia. There is mild sulcal effacement of the convexities, as seen on prior CT.  MRA HEAD FINDINGS  Anterior circulation: Normal flow related enhancement of the included cervical  internal carotid arteries, as well as the petrous, cavernous and supra clinoid internal carotid arteries, mildly filled color ectatic intracranial vessels. Normal appearance of the anterior and middle cerebral arteries.  Posterior circulation: Left vertebral artery is mildly dominant with normal flow related enhancement of the vertebral arteries, vertebrobasilar junction, basilar artery, with a local ectasia. Tiny posterior communicating arteries tube are present. Normal flow early enhancement posterior cerebral arteries. Very slight irregularity of the posterior cerebral arteries.  No large vessel occlusion, hemodynamically significant stenosis, aneurysm, suspicious luminal irregularity.  IMPRESSION: MRI head: Acute 14 mm infarct within the left posterior frontal lobe, precentral gyrus (motor cortex) without hemorrhagic conversion.  Moderate ventriculomegaly, which may be predominately on the basis of brain atrophy, however there is mild sulcal effacement at the convexities which can be seen with normal pressure hydrocephalus.  Moderate to severe white matter changes most consistent with chronic small vessel ischemic disease with patchy bright T2 signal within the basal ganglia and thalamus likely reflecting lacunar infarcts. Scattered foci of susceptibility artifact suggest chronic hypertension.  MRA head: No hemodynamically significant stenosis. Dolichoectasia, which can be seen with chronic hypertension.  Minimal irregularity of the posterior cerebral artery suggest intracranial atherosclerosis.  Electronically Signed: By: Kathy Calhoun On: 05/14/2013 02:05   No results found for this or any previous visit (from the past 72 hour(s)).    Head: Normocephalic.  Eyes: EOM are normal.  Neck: Normal range of motion. Neck supple. No thyromegaly present.  Cardiovascular: Normal rate and regular rhythm.  Respiratory: Effort normal and breath sounds normal. No respiratory distress.  GI: Soft. Bowel sounds  are normal. She exhibits no distension.  Neurological: She is alert and oriented to person, place, and time.  Makes good eye contact with examiner. Follows all commands  Skin: Skin is warm and dry.  Psychiatric: She has a normal mood and affect.  motor strength is 3 minus in the right deltoid, bicep, tricep, grip, finger flexors and extensors  4 minus in the right hip flexor knee extensors 3 minus at the ankle dorsiflexor plantar flexor  5/5 in the left deltoid, bicep and tricep, grip, hip flexor, knee extension, ankle dorsiflexor and plantar flexor   No visual field cuts   Assessment/Plan: 1. Functional deficits secondary to Left post frontal infarct which require 3+ hours per day of interdisciplinary therapy in a comprehensive inpatient rehab setting. Physiatrist is providing close team supervision and 24 hour management of active medical problems listed below. Physiatrist and rehab team continue to assess barriers to discharge/monitor patient progress toward functional and medical goals. FIM: FIM - Bathing Bathing Steps Patient Completed: Chest;Right Arm;Abdomen;Front perineal area;Buttocks;Right upper leg;Left upper leg;Right lower leg (including foot);Left lower leg (including foot) Bathing: 4: Min-Patient completes 8-9 28f 10 parts or 75+ percent  FIM - Upper Body Dressing/Undressing Upper body dressing/undressing steps patient completed: Thread/unthread right sleeve of pullover shirt/dresss;Thread/unthread left sleeve of pullover shirt/dress;Put head through opening of pull over shirt/dress;Pull shirt over trunk Upper body dressing/undressing: 5: Set-up assist to: Obtain clothing/put away FIM - Lower Body Dressing/Undressing Lower body dressing/undressing steps patient completed: Thread/unthread right underwear leg;Thread/unthread left underwear leg;Thread/unthread right pants leg;Thread/unthread left pants leg;Pull underwear up/down;Pull pants up/down;Don/Doff right sock;Don/Doff left  sock;Don/Doff right shoe;Don/Doff left shoe Lower body dressing/undressing: 5: Set-up assist to: Obtain clothing  FIM - Toileting Toileting steps completed by patient: Adjust clothing prior to toileting;Performs perineal hygiene;Adjust clothing after toileting Toileting Assistive Devices: Grab bar or rail for support Toileting: 5: Supervision: Safety issues/verbal cues  FIM - Radio producer Devices: Grab bars Toilet Transfers: 5-To toilet/BSC: Supervision (verbal cues/safety issues);5-From toilet/BSC: Supervision (verbal cues/safety issues)  FIM - Engineer, site Assistive Devices: Arm rests Bed/Chair Transfer: 5: Bed > Chair or W/C: Supervision (verbal cues/safety issues);5: Chair or W/C > Bed: Supervision (verbal cues/safety issues)  FIM - Locomotion: Wheelchair Distance: 150 Locomotion: Wheelchair: 0: Activity did not occur FIM - Locomotion: Ambulation Locomotion: Ambulation Assistive Devices: Occupational psychologist (comment) (Giv-mor sling) Ambulation/Gait Assistance: 4: Min guard Locomotion: Ambulation: 4: Travels 150 ft or more with minimal assistance (Pt.>75%)  Comprehension Comprehension Mode: Auditory Comprehension: 6-Follows complex conversation/direction: With extra time/assistive device  Expression Expression Mode: Verbal Expression: 7-Expresses complex ideas: With no assist  Social Interaction Social Interaction: 6-Interacts appropriately with others with medication or extra time (anti-anxiety, antidepressant).  Problem Solving Problem Solving: 5-Solves complex 90% of the time/cues < 10% of the time  Memory Memory: 5-Recognizes or recalls 90% of the time/requires cueing < 10% of the time  Medical Problem List and Plan:  1. DVT Prophylaxis/Anticoagulation: Pharmaceutical: Lovenox  2. Pain Management: Has used meloxicam and cymbalta in the past had stopped taking meds since Husband's death. Willing to resume  cymbalta as that has helped with neuropathy in the past. Will use tylenol prn. Add voltaren gel for shoulder pain.  3. Mood: Denies depressed mood. LCSW to follow for evaluation.  4. Neuropsych: This patient is capable of making decisions on her own behalf.  5. HTN: Will monitor with bid checks. Monitor for orthostasis.  6. Dyslipidemia: zocor.     LOS (Days) 4 A FACE TO FACE EVALUATION WAS PERFORMED  Kathy Calhoun,Kathy Calhoun 05/18/2013, 7:40 AM

## 2013-05-19 ENCOUNTER — Encounter (HOSPITAL_COMMUNITY): Payer: Medicare Other

## 2013-05-19 ENCOUNTER — Inpatient Hospital Stay (HOSPITAL_COMMUNITY): Payer: Medicare Other | Admitting: Occupational Therapy

## 2013-05-19 ENCOUNTER — Inpatient Hospital Stay (HOSPITAL_COMMUNITY): Payer: Medicare Other | Admitting: Physical Therapy

## 2013-05-19 ENCOUNTER — Inpatient Hospital Stay (HOSPITAL_COMMUNITY): Payer: Medicare Other

## 2013-05-19 DIAGNOSIS — I6350 Cerebral infarction due to unspecified occlusion or stenosis of unspecified cerebral artery: Secondary | ICD-10-CM

## 2013-05-19 NOTE — Progress Notes (Signed)
Social Work Patient ID: Kathy Calhoun, female   DOB: [birth year], 80 y.o.   MRN: [id number]  Team feels pt will be ready for discharge on Wed.  She is agreeable and MD aware and reports no medical issues.              Discussed OP versus home health and pt has no transportation so will need to make referral to home health.  Work             Toward discharge Wed.

## 2013-05-19 NOTE — Progress Notes (Signed)
Speech Language Pathology Daily Session Note  Patient Details  Name: Kathy Calhoun MRN: [id number] Date of Birth: 02/19/1934  Today's Date: 05/19/2013 Time: 0900-0930 Time Calculation (min): 30 min  Short Term Goals: Week 1: SLP Short Term Goal 1 (Week 1): Pt will demonstrate adequate medication management with Mod I SLP Short Term Goal 2 (Week 1): Pt will demonstrate complex problem solving during functional task with Mod I SLP Short Term Goal 3 (Week 1): Pt will use external memory aids to recall new and daily information with Mod I  Skilled Therapeutic Interventions: Treatment focused on cognitive goals. SLP facilitated session with verbal sequencing task. Pt completed sequencing task, recalling instructions from recipe, with Mod I. Pt demonstrated adequate alternating attention with Mod I. Continue plan of care.   FIM:  Comprehension Comprehension Mode: Auditory Comprehension: 7-Follows complex conversation/direction: With no assist Expression Expression Mode: Verbal Expression: 7-Expresses complex ideas: With no assist Social Interaction Social Interaction: 6-Interacts appropriately with others with medication or extra time (anti-anxiety, antidepressant). Problem Solving Problem Solving: 7-Solves complex problems: Recognizes & self-corrects Memory Memory: 6-More than reasonable amt of time  Pain  No/denies pain  Therapy/Group: Individual Therapy   Germain Osgood, M.A. CCC-SLP [phone]  Germain Osgood 05/19/2013, 4:43 PM

## 2013-05-19 NOTE — Progress Notes (Signed)
Subjective/Complaints: Discussed "neuropathy pain", she denies foot burning numbness o tingling, states she was started on cymbalta for back pain R shoulder pain chronic Review of Systems - Negative except R side weak  Objective: Vital Signs: Blood pressure 144/76, pulse 71, temperature 97.4 F (36.3 C), temperature source Oral, resp. rate 17, height 4\' 11"  (1.499 m), weight 52.6 kg (115 lb 15.4 oz), SpO2 97.00%. Mr Brain Wo Contrast  05/14/2013   ADDENDUM REPORT: 05/14/2013 02:10  ADDENDUM: Acute findings discussed with and reconfirmed by Derrell Lolling, [facility] on May 14, 2013 at 2:10 a.m.   Electronically Signed   By: Elon Alas   On: 05/14/2013 02:10   05/14/2013   CLINICAL DATA:  Port to 5 days of right sided weakness, predominantly right upper extremity hemi paresis.  EXAM: MRI HEAD WITHOUT CONTRAST  MRA HEAD WITHOUT CONTRAST  TECHNIQUE: Multiplanar, multiecho pulse sequences of the brain and surrounding structures were obtained without intravenous contrast. Angiographic images of the head were obtained using MRA technique without contrast.  COMPARISON:  CT HEAD W/O CM dated 05/12/2013  FINDINGS: MRI HEAD FINDINGS  14 mm focus of reduced diffusion within the left posterior frontal lobe, precentral gyrus with corresponding low ADC values. No susceptibility artifact to suggest hemorrhage. However, there are a few scattered subcentimeter focus of susceptibility artifact predominantly central in distribution.  Moderate ventriculomegaly, likely on the basis of global parenchymal brain volume loss. Patchy to come: Predominately supratentorial T2 hyperintensities is seen, with mild patchy pontine T2 hyperintensities. Sub cm remote right cerebellar infarct in addition to patchy T2 hyperintensities within the basal ganglia and thalamus suggesting lacunar infarcts. No midline shift or mass effect.  No abnormal extra-axial fluid collections. Status post bilateral ocular lens implants. No  paranasal sinus air-fluid levels, mastoid air cells are well aerated. Severe left temporomandibular osteoarthrosis with small joint effusion. No abnormal sellar expansion. No cerebellar tonsillar ectopia. Somewhat bright T1 bone marrow signal may reflect osteopenia. There is mild sulcal effacement of the convexities, as seen on prior CT.  MRA HEAD FINDINGS  Anterior circulation: Normal flow related enhancement of the included cervical internal carotid arteries, as well as the petrous, cavernous and supra clinoid internal carotid arteries, mildly filled color ectatic intracranial vessels. Normal appearance of the anterior and middle cerebral arteries.  Posterior circulation: Left vertebral artery is mildly dominant with normal flow related enhancement of the vertebral arteries, vertebrobasilar junction, basilar artery, with a local ectasia. Tiny posterior communicating arteries tube are present. Normal flow early enhancement posterior cerebral arteries. Very slight irregularity of the posterior cerebral arteries.  No large vessel occlusion, hemodynamically significant stenosis, aneurysm, suspicious luminal irregularity.  IMPRESSION: MRI head: Acute 14 mm infarct within the left posterior frontal lobe, precentral gyrus (motor cortex) without hemorrhagic conversion.  Moderate ventriculomegaly, which may be predominately on the basis of brain atrophy, however there is mild sulcal effacement at the convexities which can be seen with normal pressure hydrocephalus.  Moderate to severe white matter changes most consistent with chronic small vessel ischemic disease with patchy bright T2 signal within the basal ganglia and thalamus likely reflecting lacunar infarcts. Scattered foci of susceptibility artifact suggest chronic hypertension.  MRA head: No hemodynamically significant stenosis. Dolichoectasia, which can be seen with chronic hypertension.  Minimal irregularity of the posterior cerebral artery suggest intracranial  atherosclerosis.  Electronically Signed: By: Elon Alas On: 05/14/2013 02:05   Mr Jodene Nam Head/brain Wo Cm  05/14/2013   ADDENDUM REPORT: 05/14/2013 02:10  ADDENDUM: Acute findings discussed with  and reconfirmed by Derrell Lolling, [facility] on May 14, 2013 at 2:10 a.m.   Electronically Signed   By: Elon Alas   On: 05/14/2013 02:10   05/14/2013   CLINICAL DATA:  Port to 5 days of right sided weakness, predominantly right upper extremity hemi paresis.  EXAM: MRI HEAD WITHOUT CONTRAST  MRA HEAD WITHOUT CONTRAST  TECHNIQUE: Multiplanar, multiecho pulse sequences of the brain and surrounding structures were obtained without intravenous contrast. Angiographic images of the head were obtained using MRA technique without contrast.  COMPARISON:  CT HEAD W/O CM dated 05/12/2013  FINDINGS: MRI HEAD FINDINGS  14 mm focus of reduced diffusion within the left posterior frontal lobe, precentral gyrus with corresponding low ADC values. No susceptibility artifact to suggest hemorrhage. However, there are a few scattered subcentimeter focus of susceptibility artifact predominantly central in distribution.  Moderate ventriculomegaly, likely on the basis of global parenchymal brain volume loss. Patchy to come: Predominately supratentorial T2 hyperintensities is seen, with mild patchy pontine T2 hyperintensities. Sub cm remote right cerebellar infarct in addition to patchy T2 hyperintensities within the basal ganglia and thalamus suggesting lacunar infarcts. No midline shift or mass effect.  No abnormal extra-axial fluid collections. Status post bilateral ocular lens implants. No paranasal sinus air-fluid levels, mastoid air cells are well aerated. Severe left temporomandibular osteoarthrosis with small joint effusion. No abnormal sellar expansion. No cerebellar tonsillar ectopia. Somewhat bright T1 bone marrow signal may reflect osteopenia. There is mild sulcal effacement of the convexities, as seen on prior CT.  MRA  HEAD FINDINGS  Anterior circulation: Normal flow related enhancement of the included cervical internal carotid arteries, as well as the petrous, cavernous and supra clinoid internal carotid arteries, mildly filled color ectatic intracranial vessels. Normal appearance of the anterior and middle cerebral arteries.  Posterior circulation: Left vertebral artery is mildly dominant with normal flow related enhancement of the vertebral arteries, vertebrobasilar junction, basilar artery, with a local ectasia. Tiny posterior communicating arteries tube are present. Normal flow early enhancement posterior cerebral arteries. Very slight irregularity of the posterior cerebral arteries.  No large vessel occlusion, hemodynamically significant stenosis, aneurysm, suspicious luminal irregularity.  IMPRESSION: MRI head: Acute 14 mm infarct within the left posterior frontal lobe, precentral gyrus (motor cortex) without hemorrhagic conversion.  Moderate ventriculomegaly, which may be predominately on the basis of brain atrophy, however there is mild sulcal effacement at the convexities which can be seen with normal pressure hydrocephalus.  Moderate to severe white matter changes most consistent with chronic small vessel ischemic disease with patchy bright T2 signal within the basal ganglia and thalamus likely reflecting lacunar infarcts. Scattered foci of susceptibility artifact suggest chronic hypertension.  MRA head: No hemodynamically significant stenosis. Dolichoectasia, which can be seen with chronic hypertension.  Minimal irregularity of the posterior cerebral artery suggest intracranial atherosclerosis.  Electronically Signed: By: Elon Alas On: 05/14/2013 02:05   No results found for this or any previous visit (from the past 72 hour(s)).    Head: Normocephalic.  Eyes: EOM are normal.  Neck: Normal range of motion. Neck supple. No thyromegaly present.  Cardiovascular: Normal rate and regular rhythm.   Respiratory: Effort normal and breath sounds normal. No respiratory distress.  GI: Soft. Bowel sounds are normal. She exhibits no distension.  Neurological: She is alert and oriented to person, place, and time.  Makes good eye contact with examiner. Follows all commands  Skin: Skin is warm and dry.  Psychiatric: She has a normal mood and affect.  motor  strength is 3 minus in the right deltoid, bicep, tricep, grip, finger flexors and extensors  4 minus in the right hip flexor knee extensors 3 minus at the ankle dorsiflexor plantar flexor  5/5 in the left deltoid, bicep and tricep, grip, hip flexor, knee extension, ankle dorsiflexor and plantar flexor   No visual field cuts   Assessment/Plan: 1. Functional deficits secondary to Left post frontal infarct which require 3+ hours per day of interdisciplinary therapy in a comprehensive inpatient rehab setting. Physiatrist is providing close team supervision and 24 hour management of active medical problems listed below. Physiatrist and rehab team continue to assess barriers to discharge/monitor patient progress toward functional and medical goals. FIM: FIM - Bathing Bathing Steps Patient Completed: Chest;Right Arm;Abdomen;Front perineal area;Buttocks;Right upper leg;Left upper leg;Right lower leg (including foot);Left lower leg (including foot) Bathing: 4: Min-Patient completes 8-9 73f 10 parts or 75+ percent  FIM - Upper Body Dressing/Undressing Upper body dressing/undressing steps patient completed: Thread/unthread right sleeve of pullover shirt/dresss;Thread/unthread left sleeve of pullover shirt/dress;Put head through opening of pull over shirt/dress;Pull shirt over trunk Upper body dressing/undressing: 5: Set-up assist to: Obtain clothing/put away FIM - Lower Body Dressing/Undressing Lower body dressing/undressing steps patient completed: Thread/unthread right underwear leg;Thread/unthread left underwear leg;Thread/unthread right pants  leg;Thread/unthread left pants leg;Pull underwear up/down;Pull pants up/down;Don/Doff right sock;Don/Doff left sock;Don/Doff right shoe;Don/Doff left shoe Lower body dressing/undressing: 5: Set-up assist to: Obtain clothing  FIM - Toileting Toileting steps completed by patient: Adjust clothing prior to toileting;Performs perineal hygiene;Adjust clothing after toileting Toileting Assistive Devices: Grab bar or rail for support Toileting: 5: Supervision: Safety issues/verbal cues  FIM - Radio producer Devices: Grab bars Toilet Transfers: 5-To toilet/BSC: Supervision (verbal cues/safety issues);5-From toilet/BSC: Supervision (verbal cues/safety issues)  FIM - Engineer, site Assistive Devices: Arm rests Bed/Chair Transfer: 5: Bed > Chair or W/C: Supervision (verbal cues/safety issues);5: Chair or W/C > Bed: Supervision (verbal cues/safety issues)  FIM - Locomotion: Wheelchair Distance: 150 Locomotion: Wheelchair: 0: Activity did not occur FIM - Locomotion: Ambulation Locomotion: Ambulation Assistive Devices: Occupational psychologist (comment) (Giv-mor sling) Ambulation/Gait Assistance: 4: Min guard Locomotion: Ambulation: 4: Travels 150 ft or more with minimal assistance (Pt.>75%)  Comprehension Comprehension Mode: Auditory Comprehension: 6-Follows complex conversation/direction: With extra time/assistive device  Expression Expression Mode: Verbal Expression: 7-Expresses complex ideas: With no assist  Social Interaction Social Interaction: 6-Interacts appropriately with others with medication or extra time (anti-anxiety, antidepressant).  Problem Solving Problem Solving: 5-Solves complex 90% of the time/cues < 10% of the time  Memory Memory: 5-Recognizes or recalls 90% of the time/requires cueing < 10% of the time  Medical Problem List and Plan:  1. DVT Prophylaxis/Anticoagulation: Pharmaceutical: Lovenox  2. Pain Management:    cymbalta for Chronic LBP  Will use tylenol prn. Add voltaren gel for shoulder pain.  3. Mood: Denies depressed mood. LCSW to follow for evaluation.  4. Neuropsych: This patient is capable of making decisions on her own behalf.  5. HTN: Will monitor with bid checks. Monitor for orthostasis.  6. Dyslipidemia: zocor.     LOS (Days) 5 A FACE TO FACE EVALUATION WAS PERFORMED  Lafayette Dunlevy E 05/19/2013, 7:10 AM

## 2013-05-19 NOTE — Progress Notes (Signed)
Physical Therapy Session Note  Patient Details  Name: Kathy Calhoun MRN: [id number] Date of Birth: 10/06/1933  Today's Date: 05/19/2013 Time: 1105-1201 Time Calculation (min): 56 min  Short Term Goals: Week 1:  PT Short Term Goal 1 (Week 1): = LTG secondary to short LOS  Skilled Therapeutic Interventions/Progress Updates:   Pt resting in recliner. Performed gait x 150' x 2 in controlled environment with intermittent cues to widen BOS and increased gait velocity.  Pt demonstrating decreased LOB during head turns and changes in direction.  Performed simulated car transfer training with pt verbalizing and demonstrating safe sequence sitting first and then bringing LE into the car mod I.  Car goal met.  Transitioned to falls risk and floor transfer training.  Discussed falls risk with pt and keeping phone with her at all times or having family check in with her during the day in case of fall and also discussed indications for calling EMS.  Pt demonstrated safe floor > furniture transfer supervision.  Continued stair negotiation training up/down 2 tall stairs without rails with step to sequence with supervision x 2 reps with arms free and then 2 more reps with LUE carrying 2 4lb ankle weights to mimic carrying grocery sacks into house (through front entrance).  Also performed full flight of stair training with L rail (for access to laundry in basement) up/down 12 stairs x 2 reps with alternating sequence to ascend with LUE support and descending with step to sequence laterally with LUE support on rail.  Performed NMR and balance training for LLE during gait with long 2x4 between feet for wider BOS x 4 reps as single task + 2 more reps with dual task cognitive/math activity.  Performed tandem gait on 2x4 x 2 reps as single task and dual cognitive task added with min A.  Returned to room with supervision.     Therapy Documentation Precautions:  Precautions Precautions: Fall Precaution Comments: history of  rotator cuff repair and low back pain Required Braces or Orthoses:  (sling issued for increased support to right upper extremity) Restrictions Weight Bearing Restrictions: No Pain: Pain Assessment Pain Assessment: No/denies pain Locomotion : Ambulation Ambulation/Gait Assistance: 5: Supervision   See FIM for current functional status  Therapy/Group: Individual Therapy  Hall, Audra Faucette 05/19/2013, 12:31 PM  

## 2013-05-19 NOTE — Progress Notes (Signed)
Physical Therapy Discharge Summary  Patient Details  Name: Kathy Calhoun MRN: [id number] Date of Birth: 07/27/1933  Today's Date: 05/20/2013 Time: 4431-5400 Time Calculation (min): 39 min  Patient has met 10 of 10 long term goals due to improved balance, improved postural control, increased strength, ability to compensate for deficits, functional use of  right lower extremity, improved attention, improved awareness and improved coordination.  Patient to discharge at an ambulatory level Modified Independent.   Patient's care partner did not attend a formal family education session in order to provide the necessary supervision assistance at discharge but pt is mod I with all mobility and able to explain functional mobility sequences/safety issues to all family/caregivers.  Reasons goals not met: All goals met  Recommendation:  Patient will benefit from ongoing skilled PT services; recommending outpatient therapy but if pt unable to find transportation she will be scheduled for HHPT to continue to advance safe functional mobility, address ongoing impairments in R hemiparesis with impaired motor control, timing, sequencing, coordination, balance, gait, and minimize fall risk.  Equipment: No equipment provided  Reasons for discharge: treatment goals met and discharge from hospital  Patient/family agrees with progress made and goals achieved: Yes  PT Discharge Vital Signs Therapy Vitals Temp: 98.1 F (36.7 C) Temp src: Oral Pulse Rate: 79 Resp: 14 BP: 128/76 mmHg Patient Position, if appropriate: Sitting Oxygen Therapy SpO2: 99 % O2 Device: None (Room air) Pain Pain Assessment Pain Assessment: No/denies pain Vision/Perception  Perception Perception: Within Functional Limits Praxis Praxis: Intact  Sensation Sensation Light Touch: Impaired Detail Light Touch Impaired Details: Impaired RUE Stereognosis: Not tested Hot/Cold: Not tested Proprioception: Appears  Intact Coordination Gross Motor Movements are Fluid and Coordinated: No Fine Motor Movements are Fluid and Coordinated: No Coordination and Movement Description: impaired by weakness and history of Rt rotator cuff repair Finger Nose Finger Test: impaired due to history or Rt rotator cuff repair with decreased shoulder movement Motor  Motor Motor: Abnormal postural alignment and control;Motor impersistence;Hemiplegia Motor - Discharge Observations: RUE > RLE hemiparesis, R shoulder droop with R trunk lateral flexion, L ribs flared  Mobility Bed Mobility Bed Mobility: Supine to Sit;Sit to Supine Supine to Sit: 6: Modified independent (Device/Increase time) Sit to Supine: 6: Modified independent (Device/Increase time) Transfers Stand Pivot Transfers: 6: Modified independent (Device/Increase time)  Pt also demonstrated safe floor > furniture transfer mod I with pt performing problem solving and sequencing without verbal cues.  Locomotion  Ambulation Ambulation/Gait Assistance: 6: Modified independent (Device/Increase time) Ambulation Distance (Feet): 300 Feet Ambulation/Gait Assistance Details: Performed gait in controlled, home environments mod I and community (outing in grocery store) with supervision without AD but with extra time.   Gait Gait: Yes Gait Pattern: Impaired Gait Pattern: Step-through pattern;Decreased weight shift to right;Scissoring;Lateral trunk lean to right;Narrow base of support Stairs / Additional Locomotion Stairs: Yes Stairs Assistance: 5: Supervision Stairs Assistance Details (indicate cue type and reason): Performed stairs to simulate front entrance: up/down 2 stairs with no rails with step to sequencing with supervision secondary to tendency to catch R heel on step; also simulated full flight to basement laundry with L rail with pt ascending forwards with LUE support on rail and descending laterally with LUE support on rail  Stair Management Technique: No  rails;One rail Left;Step to pattern;Sideways;Forwards Number of Stairs: 12 Height of Stairs: 6 Wheelchair Mobility Wheelchair Mobility: No  Trunk/Postural Assessment  Cervical Assessment Cervical Assessment: Within Functional Limits Thoracic Assessment Thoracic Assessment:  (same as eval) Lumbar Assessment Lumbar  Assessment:  (same as eval) Postural Control Postural Control:  (same as eval)  Balance Standardized Balance Assessment Standardized Balance Assessment: Berg Balance Test Berg Balance Test Sit to Stand: Able to stand without using hands and stabilize independently Standing Unsupported: Able to stand safely 2 minutes Sitting with Back Unsupported but Feet Supported on Floor or Stool: Able to sit safely and securely 2 minutes Stand to Sit: Sits safely with minimal use of hands Transfers: Able to transfer safely, minor use of hands Standing Unsupported with Eyes Closed: Able to stand 10 seconds safely Standing Ubsupported with Feet Together: Able to place feet together independently and stand 1 minute safely From Standing, Reach Forward with Outstretched Arm: Can reach forward >12 cm safely (5") From Standing Position, Pick up Object from Floor: Able to pick up shoe safely and easily From Standing Position, Turn to Look Behind Over each Shoulder: Looks behind from both sides and weight shifts well Turn 360 Degrees: Able to turn 360 degrees safely in 4 seconds or less Standing Unsupported, Alternately Place Feet on Step/Stool: Able to stand independently and safely and complete 8 steps in 20 seconds Standing Unsupported, One Foot in Front: Able to plae foot ahead of the other independently and hold 30 seconds Standing on One Leg: Able to lift leg independently and hold 5-10 seconds Total Score: 53 Patient demonstrates increased fall risk as noted by score of 53/56 on Berg Balance Scale improved from 43/56 on eval.  (<36= high risk for falls, close to 100%; 37-45 significant  >80%; 46-51 moderate >50%; 52-55 lower >25%)  Extremity Assessment RLE Strength RLE Overall Strength: Deficits RLE Overall Strength Comments: 3+/5 overall; motor impersistence LLE Assessment LLE Assessment: Within Functional Limits  See FIM for current functional status  Raylene Everts Faucette 05/20/2013, 4:30 PM

## 2013-05-19 NOTE — Progress Notes (Signed)
Occupational Therapy Session Note  Patient Details  Name: Kathy Calhoun MRN: [id number] Date of Birth: 1933/10/12  Today's Date: 05/19/2013 Time: 0730-0825 and 1345-1430 Time Calculation (min): 55 min and 45 min  Short Term Goals: No short term goals set  Skilled Therapeutic Interventions/Progress Updates:    1) Engaged in ADL retraining with focus on increased independence and safety with self-care tasks of bathing and dressing.  Pt gathered clothing from closet with distant supervision.  Completed bathing at standing level in walk-in shower with use of grab bars when washing feet.  Pt reports not having grab bars at home but could have some installed, preferring standing to sitting.  Pt with 1 LOB when attempting to dry feet in standing requiring max assist to regain balance, educated on sitting on chair in bathroom or toilet seat when drying feet to increase safety.  Pt sat to complete dressing with increased time to button shirt, utilized long handled shoe horn to don shoes.    2) Engaged in home management tasks with simple meal prep, simple housekeeping, and medication management.  Pt ambulated to ADL apt without AD mod I, completed simple housekeeping tasks with folding clothes and hanging clothes in the closet.  Pt utilized RUE as gross assist with folding tasks and diminished level with hanging clothes able to stabilize hanger in Rt hand.  Pt with no LOB during housekeeping tasks.  Discussed various ways to increase safety and activity tolerance to improve independence with above.  Simple meal prep with pt gathering items from refrigerator and cabinet.  Discussed purchasing cans with pop tab instead of those requiring can opener to increase independence with opening while stabilizing can with Rt hand.  Pt with questions regarding opening pill containers, provided pt with 4 different types of containers with pt able to open each with increased time by stabilizing container either in Rt hand or  between legs depending on size and difficulty.  Discussed if possible having pt request certain type of pill bottle to increase independence, but if not an option she can still maneuver each type.  Therapy Documentation Precautions:  Precautions Precautions: Fall Precaution Comments: history of rotator cuff repair and low back pain Required Braces or Orthoses:  (sling issued for increased support to right upper extremity) Restrictions Weight Bearing Restrictions: No General:   Vital Signs: Therapy Vitals Temp: 97.4 F (36.3 C) Temp src: Oral Pulse Rate: 71 Resp: 17 BP: 141/70 mmHg Patient Position, if appropriate: Lying Oxygen Therapy SpO2: 97 % O2 Device: None (Room air) Pain:  Pt reports slight discomfort in Rt shoulder with movement, however unrated and denied pain meds.  See FIM for current functional status  Therapy/Group: Individual Therapy  Jennefer Kopp, [place] 05/19/2013, 8:30 AM

## 2013-05-20 ENCOUNTER — Encounter (HOSPITAL_COMMUNITY): Payer: Medicare Other | Admitting: Rehabilitation

## 2013-05-20 ENCOUNTER — Inpatient Hospital Stay (HOSPITAL_COMMUNITY): Payer: Medicare Other | Admitting: Physical Therapy

## 2013-05-20 ENCOUNTER — Inpatient Hospital Stay (HOSPITAL_COMMUNITY): Payer: Medicare Other | Admitting: Occupational Therapy

## 2013-05-20 ENCOUNTER — Inpatient Hospital Stay (HOSPITAL_COMMUNITY): Payer: Medicare Other | Admitting: Speech Pathology

## 2013-05-20 ENCOUNTER — Inpatient Hospital Stay (HOSPITAL_COMMUNITY): Payer: Medicare Other

## 2013-05-20 MED ORDER — CALCIUM CARBONATE-VITAMIN D 500-200 MG-UNIT PO TABS
1.0000 | ORAL_TABLET | Freq: Two times a day (BID) | ORAL | Status: DC
  Administered 2013-05-20 – 2013-05-21 (×3): 1 via ORAL
  Filled 2013-05-20 (×5): qty 1

## 2013-05-20 MED ORDER — CALCIUM CITRATE-VITAMIN D 500-400 MG-UNIT PO CHEW: 1.0000 | CHEWABLE_TABLET | Freq: Two times a day (BID) | ORAL | Status: DC
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
## 2013-05-20 NOTE — Progress Notes (Signed)
Physical Therapy Session Note  Patient Details  Name: Kathy Calhoun MRN: [id number] Date of Birth: 1933-09-15  Today's Date: 05/20/2013 Time: 1030-1200 Time Calculation (min): 90 min  Short Term Goals: Week 1:  PT Short Term Goal 1 (Week 1): = LTG secondary to short LOS  Skilled Therapeutic Interventions/Progress Updates:   Pt seen for outing with RT during session to grocery store.  Had pt negotiate up/down step to get into/out of van x 4 reps, negotiate curb step, work on ambulation at [employer] with shopping cart for increased independence in community at supervision level, standing dynamic balance with reaching for items on shelf, memory, alternating and divided attention.  Pt able to perform all tasks at supervision (assist for heavier items with RUE due to RCT history).  See paper goal sheet for further details on outing.  Pt also able to manage grocery bags out to therapy van at end of session.  Pt returned to room and was left in room with all needs in reach.    Therapy Documentation Precautions:  Precautions Precautions: Fall Precaution Comments: history of rotator cuff repair and low back pain Required Braces or Orthoses:  (sling issued for increased support to right upper extremity) Restrictions Weight Bearing Restrictions: No   Pain: Pain Assessment Pain Assessment: 0-10 Pain Score: 0-No pain   Locomotion : Ambulation Ambulation/Gait Assistance: 5: Supervision   See FIM for current functional status  Therapy/Group: Individual Therapy  Denice Bors 05/20/2013, 12:11 PM

## 2013-05-20 NOTE — Discharge Instructions (Signed)
Inpatient Rehab Discharge Instructions  Kathy Calhoun Discharge date and time:  05/21/13  Activities/Precautions/ Functional Status: Activity: activity as tolerated Diet: cardiac diet Wound Care: none needed  Functional status:  ___ No restrictions     ___ Walk up steps independently ___ 24/7 supervision/assistance   ___ Walk up steps with assistance _X__ Intermittent supervision/assistance  _X__ Bathe/dress independently ___ Walk with walker     ___ Bathe/dress with assistance ___ Walk Independently    ___ Shower independently ___ Walk with assistance    ___ Shower with assistance _X__ No alcohol     ___ Return to work/school ________  Special Instructions:    COMMUNITY REFERRALS UPON DISCHARGE:    Home Health:   PT, [place] UVOZD:[phone] Date of last service:05/20/2013  Medical Equipment/Items Ordered:NO NEEDS    GENERAL COMMUNITY RESOURCES FOR PATIENT/FAMILY: Support Groups:[facility] SUPPORT GROUP  STROKE/TIA DISCHARGE INSTRUCTIONS SMOKING Cigarette smoking nearly doubles your risk of having a stroke & is the single most alterable risk factor  If you smoke or have smoked in the last 12 months, you are advised to quit smoking for your health.  Most of the excess cardiovascular risk related to smoking disappears within a year of stopping.  Ask you doctor about anti-smoking medications  [place] Quit Line: 1-800-QUIT NOW  Free Smoking Cessation Classes [phone]  CHOLESTEROL Know your levels; limit fat & cholesterol in your diet  Lipid Panel     Component Value Date/Time   CHOL 259* 05/13/2013 0440   TRIG 178* 05/13/2013 0440   HDL 75 05/13/2013 0440   CHOLHDL 3.5 05/13/2013 0440   VLDL 36 05/13/2013 0440   LDLCALC 148* 05/13/2013 0440      Many patients benefit from treatment even if their cholesterol is at goal.  Goal: Total Cholesterol (CHOL) less than 160  Goal:  Triglycerides (TRIG) less than 150  Goal:  HDL greater than 40  Goal:  LDL  (LDLCALC) less than 100   BLOOD PRESSURE American Stroke Association blood pressure target is less that 120/80 mm/Hg  Your discharge blood pressure is:  BP: 131/67 mmHg  Monitor your blood pressure  Limit your salt and alcohol intake  Many individuals will require more than one medication for high blood pressure  DIABETES (A1c is a blood sugar average for last 3 months) Goal HGBA1c is under 7% (HBGA1c is blood sugar average for last 3 months)  Diabetes: No known diagnosis of diabetes    Lab Results  Component Value Date   HGBA1C 5.8* 05/13/2013     Your HGBA1c can be lowered with medications, healthy diet, and exercise.  Check your blood sugar as directed by your physician  Call your physician if you experience unexplained or low blood sugars.  PHYSICAL ACTIVITY/REHABILITATION Goal is 30 minutes at least 4 days per week  Activity: No driving, Therapies: See above Return to work:  N/A  Activity decreases your risk of heart attack and stroke and makes your heart stronger.  It helps control your weight and blood pressure; helps you relax and can improve your mood.  Participate in a regular exercise program.  Talk with your doctor about the best form of exercise for you (dancing, walking, swimming, cycling).  DIET/WEIGHT Goal is to maintain a healthy weight  Your discharge diet is: Cardiac thin liquids Your height is:  Height: 4\' 11"  (149.9 cm) Your current weight is: Weight: 52.6 kg (115 lb 15.4 oz) Your Body Mass Index (BMI) is:  BMI (Calculated):  23.5  Following the type of diet specifically designed for you will help prevent another stroke.  Your goal weight: at goal weight.  Your goal Body Mass Index (BMI) is 19-24.  Healthy food habits can help reduce 3 risk factors for stroke:  High cholesterol, hypertension, and excess weight.  RESOURCES Stroke/Support Group:  Call [phone]   STROKE EDUCATION PROVIDED/REVIEWED AND GIVEN TO PATIENT Stroke warning signs and  symptoms How to activate emergency medical system (call 911). Medications prescribed at discharge. Need for follow-up after discharge. Personal risk factors for stroke. Pneumonia vaccine given:  Flu vaccine given:  My questions have been answered, the writing is legible, and I understand these instructions.  I will adhere to these goals & educational materials that have been provided to me after my discharge from the hospital.       My questions have been answered and I understand these instructions. I will adhere to these goals and the provided educational materials after my discharge from the hospital.  Patient/Caregiver Signature _______________________________ Date __________  Clinician Signature _______________________________________ Date __________  Please bring this form and your medication list with you to all your follow-up doctor's appointments.

## 2013-05-20 NOTE — Progress Notes (Signed)
Subjective/Complaints: Appreciate SW note, pt aware of D/C in am asking about Vit D and Ca R shoulder pain chronic Review of Systems - Negative except R side weak  Objective: Vital Signs: Blood pressure 130/74, pulse 71, temperature 98.3 F (36.8 C), temperature source Oral, resp. rate 18, height 4\' 11"  (1.499 m), weight 52.6 kg (115 lb 15.4 oz), SpO2 97.00%. Mr Brain Wo Contrast  05/14/2013   ADDENDUM REPORT: 05/14/2013 02:10  ADDENDUM: Acute findings discussed with and reconfirmed by Derrell Lolling, [facility] on May 14, 2013 at 2:10 a.m.   Electronically Signed   By: Elon Alas   On: 05/14/2013 02:10   05/14/2013   CLINICAL DATA:  Port to 5 days of right sided weakness, predominantly right upper extremity hemi paresis.  EXAM: MRI HEAD WITHOUT CONTRAST  MRA HEAD WITHOUT CONTRAST  TECHNIQUE: Multiplanar, multiecho pulse sequences of the brain and surrounding structures were obtained without intravenous contrast. Angiographic images of the head were obtained using MRA technique without contrast.  COMPARISON:  CT HEAD W/O CM dated 05/12/2013  FINDINGS: MRI HEAD FINDINGS  14 mm focus of reduced diffusion within the left posterior frontal lobe, precentral gyrus with corresponding low ADC values. No susceptibility artifact to suggest hemorrhage. However, there are a few scattered subcentimeter focus of susceptibility artifact predominantly central in distribution.  Moderate ventriculomegaly, likely on the basis of global parenchymal brain volume loss. Patchy to come: Predominately supratentorial T2 hyperintensities is seen, with mild patchy pontine T2 hyperintensities. Sub cm remote right cerebellar infarct in addition to patchy T2 hyperintensities within the basal ganglia and thalamus suggesting lacunar infarcts. No midline shift or mass effect.  No abnormal extra-axial fluid collections. Status post bilateral ocular lens implants. No paranasal sinus air-fluid levels, mastoid air cells are well  aerated. Severe left temporomandibular osteoarthrosis with small joint effusion. No abnormal sellar expansion. No cerebellar tonsillar ectopia. Somewhat bright T1 bone marrow signal may reflect osteopenia. There is mild sulcal effacement of the convexities, as seen on prior CT.  MRA HEAD FINDINGS  Anterior circulation: Normal flow related enhancement of the included cervical internal carotid arteries, as well as the petrous, cavernous and supra clinoid internal carotid arteries, mildly filled color ectatic intracranial vessels. Normal appearance of the anterior and middle cerebral arteries.  Posterior circulation: Left vertebral artery is mildly dominant with normal flow related enhancement of the vertebral arteries, vertebrobasilar junction, basilar artery, with a local ectasia. Tiny posterior communicating arteries tube are present. Normal flow early enhancement posterior cerebral arteries. Very slight irregularity of the posterior cerebral arteries.  No large vessel occlusion, hemodynamically significant stenosis, aneurysm, suspicious luminal irregularity.  IMPRESSION: MRI head: Acute 14 mm infarct within the left posterior frontal lobe, precentral gyrus (motor cortex) without hemorrhagic conversion.  Moderate ventriculomegaly, which may be predominately on the basis of brain atrophy, however there is mild sulcal effacement at the convexities which can be seen with normal pressure hydrocephalus.  Moderate to severe white matter changes most consistent with chronic small vessel ischemic disease with patchy bright T2 signal within the basal ganglia and thalamus likely reflecting lacunar infarcts. Scattered foci of susceptibility artifact suggest chronic hypertension.  MRA head: No hemodynamically significant stenosis. Dolichoectasia, which can be seen with chronic hypertension.  Minimal irregularity of the posterior cerebral artery suggest intracranial atherosclerosis.  Electronically Signed: By: Elon Alas  On: 05/14/2013 02:05   Mr Jodene Nam Head/brain Wo Cm  05/14/2013   ADDENDUM REPORT: 05/14/2013 02:10  ADDENDUM: Acute findings discussed with and reconfirmed by Juliann Pulse  Shorr, [facility] on May 14, 2013 at 2:10 a.m.   Electronically Signed   By: Elon Alas   On: 05/14/2013 02:10   05/14/2013   CLINICAL DATA:  Port to 5 days of right sided weakness, predominantly right upper extremity hemi paresis.  EXAM: MRI HEAD WITHOUT CONTRAST  MRA HEAD WITHOUT CONTRAST  TECHNIQUE: Multiplanar, multiecho pulse sequences of the brain and surrounding structures were obtained without intravenous contrast. Angiographic images of the head were obtained using MRA technique without contrast.  COMPARISON:  CT HEAD W/O CM dated 05/12/2013  FINDINGS: MRI HEAD FINDINGS  14 mm focus of reduced diffusion within the left posterior frontal lobe, precentral gyrus with corresponding low ADC values. No susceptibility artifact to suggest hemorrhage. However, there are a few scattered subcentimeter focus of susceptibility artifact predominantly central in distribution.  Moderate ventriculomegaly, likely on the basis of global parenchymal brain volume loss. Patchy to come: Predominately supratentorial T2 hyperintensities is seen, with mild patchy pontine T2 hyperintensities. Sub cm remote right cerebellar infarct in addition to patchy T2 hyperintensities within the basal ganglia and thalamus suggesting lacunar infarcts. No midline shift or mass effect.  No abnormal extra-axial fluid collections. Status post bilateral ocular lens implants. No paranasal sinus air-fluid levels, mastoid air cells are well aerated. Severe left temporomandibular osteoarthrosis with small joint effusion. No abnormal sellar expansion. No cerebellar tonsillar ectopia. Somewhat bright T1 bone marrow signal may reflect osteopenia. There is mild sulcal effacement of the convexities, as seen on prior CT.  MRA HEAD FINDINGS  Anterior circulation: Normal flow related  enhancement of the included cervical internal carotid arteries, as well as the petrous, cavernous and supra clinoid internal carotid arteries, mildly filled color ectatic intracranial vessels. Normal appearance of the anterior and middle cerebral arteries.  Posterior circulation: Left vertebral artery is mildly dominant with normal flow related enhancement of the vertebral arteries, vertebrobasilar junction, basilar artery, with a local ectasia. Tiny posterior communicating arteries tube are present. Normal flow early enhancement posterior cerebral arteries. Very slight irregularity of the posterior cerebral arteries.  No large vessel occlusion, hemodynamically significant stenosis, aneurysm, suspicious luminal irregularity.  IMPRESSION: MRI head: Acute 14 mm infarct within the left posterior frontal lobe, precentral gyrus (motor cortex) without hemorrhagic conversion.  Moderate ventriculomegaly, which may be predominately on the basis of brain atrophy, however there is mild sulcal effacement at the convexities which can be seen with normal pressure hydrocephalus.  Moderate to severe white matter changes most consistent with chronic small vessel ischemic disease with patchy bright T2 signal within the basal ganglia and thalamus likely reflecting lacunar infarcts. Scattered foci of susceptibility artifact suggest chronic hypertension.  MRA head: No hemodynamically significant stenosis. Dolichoectasia, which can be seen with chronic hypertension.  Minimal irregularity of the posterior cerebral artery suggest intracranial atherosclerosis.  Electronically Signed: By: Elon Alas On: 05/14/2013 02:05   No results found for this or any previous visit (from the past 72 hour(s)).    Head: Normocephalic.  Eyes: EOM are normal.  Neck: Normal range of motion. Neck supple. No thyromegaly present.  Cardiovascular: Normal rate and regular rhythm.  Respiratory: Effort normal and breath sounds normal. No respiratory  distress.  GI: Soft. Bowel sounds are normal. She exhibits no distension.  Neurological: She is alert and oriented to person, place, and time.  Makes good eye contact with examiner. Follows all commands  Skin: Skin is warm and dry.  Psychiatric: She has a normal mood and affect.  motor strength is 3 minus  in the right deltoid, bicep, tricep, grip, finger flexors and extensors  4 minus in the right hip flexor knee extensors 3 minus at the ankle dorsiflexor plantar flexor  5/5 in the left deltoid, bicep and tricep, grip, hip flexor, knee extension, ankle dorsiflexor and plantar flexor   No visual field cuts   Assessment/Plan: 1. Functional deficits secondary to Left post frontal infarct which require 3+ hours per day of interdisciplinary therapy in a comprehensive inpatient rehab setting. Physiatrist is providing close team supervision and 24 hour management of active medical problems listed below. Physiatrist and rehab team continue to assess barriers to discharge/monitor patient progress toward functional and medical goals. FIM: FIM - Bathing Bathing Steps Patient Completed: Chest;Right Arm;Abdomen;Front perineal area;Buttocks;Right upper leg;Left upper leg;Right lower leg (including foot);Left lower leg (including foot) Bathing: 4: Min-Patient completes 8-9 35f 10 parts or 75+ percent  FIM - Upper Body Dressing/Undressing Upper body dressing/undressing steps patient completed: Thread/unthread right sleeve of front closure shirt/dress;Thread/unthread left sleeve of front closure shirt/dress;Pull shirt around back of front closure shirt/dress;Button/unbutton shirt Upper body dressing/undressing: 6: More than reasonable amount of time FIM - Lower Body Dressing/Undressing Lower body dressing/undressing steps patient completed: Thread/unthread right underwear leg;Thread/unthread left underwear leg;Thread/unthread right pants leg;Thread/unthread left pants leg;Pull underwear up/down;Pull pants  up/down;Don/Doff right sock;Don/Doff left sock;Don/Doff right shoe;Don/Doff left shoe Lower body dressing/undressing: 5: Supervision: Safety issues/verbal cues  FIM - Toileting Toileting steps completed by patient: Adjust clothing prior to toileting;Performs perineal hygiene;Adjust clothing after toileting Toileting Assistive Devices: Grab bar or rail for support Toileting: 5: Supervision: Safety issues/verbal cues  FIM - Radio producer Devices: Grab bars Toilet Transfers: 5-To toilet/BSC: Supervision (verbal cues/safety issues);5-From toilet/BSC: Supervision (verbal cues/safety issues)  FIM - Engineer, site Assistive Devices: Arm rests Bed/Chair Transfer: 5: Bed > Chair or W/C: Supervision (verbal cues/safety issues);5: Chair or W/C > Bed: Supervision (verbal cues/safety issues)  FIM - Locomotion: Wheelchair Distance: 150 Locomotion: Wheelchair: 0: Activity did not occur FIM - Locomotion: Ambulation Locomotion: Ambulation Assistive Devices: Occupational psychologist (comment) (Giv-mor sling) Ambulation/Gait Assistance: 5: Supervision Locomotion: Ambulation: 5: Travels 150 ft or more with supervision/safety issues  Comprehension Comprehension Mode: Auditory Comprehension: 7-Follows complex conversation/direction: With no assist  Expression Expression Mode: Verbal Expression: 7-Expresses complex ideas: With no assist  Social Interaction Social Interaction: 6-Interacts appropriately with others with medication or extra time (anti-anxiety, antidepressant).  Problem Solving Problem Solving: 7-Solves complex problems: Recognizes & self-corrects  Memory Memory: 6-More than reasonable amt of time  Medical Problem List and Plan:  1. DVT Prophylaxis/Anticoagulation: Pharmaceutical: Lovenox  2. Pain Management:   cymbalta for Chronic LBP  Will use tylenol prn. Add voltaren gel for shoulder pain.  3. Mood: Denies depressed mood. LCSW to  follow for evaluation.  4. Neuropsych: This patient is capable of making decisions on her own behalf.  5. HTN: Will monitor with bid checks. Monitor for orthostasis.  6. Dyslipidemia: zocor.  7.  Hx osteoporosis Ca and Vit D   LOS (Days) 6 A FACE TO FACE EVALUATION WAS PERFORMED  KIRSTEINS,ANDREW E 05/20/2013, 6:56 AM

## 2013-05-20 NOTE — Progress Notes (Signed)
Recreational Therapy Session Note  Patient Details  Name: LARAH KUNTZMAN MRN: [id number] Date of Birth: 1933-12-27 Today's Date: 05/20/2013  Pain: no c/o Skilled Therapeutic Interventions/Progress Updates: Pt referred by treatment team for participation in community reintegration/outing to grocery store & pt agreeable to participate.  Pt at overall supervision level for community reintegration including ambulation on even/uneven surfaces, obstacle negotiation, safety, energy conservation, RUE use & problem solving.  Therapy/Group: [employer]   Orey Moure 05/20/2013, 4:32 PM

## 2013-05-20 NOTE — Progress Notes (Signed)
Speech Language Pathology Daily Session Note  Patient Details  Name: Kathy Calhoun MRN: [id number] Date of Birth: Aug 10, 1933  Today's Date: 05/20/2013 Time: 7412-8786 Time Calculation (min): 45 min  Short Term Goals: Week 1: SLP Short Term Goal 1 (Week 1): Pt will demonstrate adequate medication management with Mod I SLP Short Term Goal 2 (Week 1): Pt will demonstrate complex problem solving during functional task with Mod I SLP Short Term Goal 3 (Week 1): Pt will use external memory aids to recall new and daily information with Mod I  Skilled Therapeutic Interventions: Skilled ST intervention provided with focus on cognitive/self-care skills and upcoming discharge. Pt recalled medications with use of external aide and demonstrated independence with use of medication organizer, 100% accuracy. Slp reviewed med-management skills and use of pill organizer at home, upon d/c, pt expressed understanding. Pt recalled use of external aides to increase recall, no assist. Slp provided education regarding memory strategies and provided pt with handout. Slp reviewed progress with goals since admission and discussed home health service. Both patient and clinician agree with no recommendations of home health or outpatient SLP services.    FIM:  Comprehension Comprehension Mode: Auditory Comprehension: 7-Follows complex conversation/direction: With no assist Expression Expression: 7-Expresses complex ideas: With no assist Social Interaction Social Interaction: 6-Interacts appropriately with others with medication or extra time (anti-anxiety, antidepressant). Problem Solving Problem Solving: 6-Solves complex problems: With extra time Memory Memory: 6-Assistive device: No helper  Pain Pain Assessment Pain Assessment: No/denies pain  Therapy/Group: Individual Therapy  Ilhan Madan, Bernerd Pho 05/20/2013, 5:29 PM

## 2013-05-20 NOTE — Progress Notes (Signed)
Speech Language Pathology Discharge Summary  Patient Details  Name: Kathy Calhoun MRN: [id number] Date of Birth: 1933/10/18  Today's Date: 05/20/2013 Time: 3810-1751 Time Calculation (min): 45 min  Skilled Therapeutic Interventions: Skilled ST intervention provided with focus on cognitive/self-care skills and upcoming discharge. Pt recalled medications with use of external aide and demonstrated independence with use of medication organizer, 100% accuracy. Slp reviewed med-management skills and use of pill organizer at home, upon d/c, pt expressed understanding. Pt recalled use of external aides to increase recall, no assist. Slp provided education regarding memory strategies and provided pt with handout. Slp reviewed progress with goals since admission and discussed home health service. Both patient and clinician agree with no recommendations of home health or outpatient SLP services.     Patient has met 3 of 3 long term goals.  Patient to discharge at overall Modified Independent level.  Reasons goals not met:   n/a  Clinical Impression/Discharge Summary: Kathy Calhoun is a 80 y.o. right-handed female with history of hypertension--"no BP medication due to falls", chronic LBP and R-RTC problems with RUE weakness. Admitted 05/12/2013 with 4-5 day history of right-sided weakness, right facial numbness and difficulty walking. CT of the head showed focal oval area of low attenuation left parietal lobe deep white matter above the level of the ventricles. MRI showed acute infarct within the left posterior frontal lobe, precentral gyrus. MRA of the head without stenosis. Echocardiogram with ejection fraction 02% grade 1 diastolic dysfunction. Carotid Dopplers without ICA stenosis. Patient placed on ASA for secondary stroke prevention. Therapy initiated and CIR recommended by rehab team. Pt initially noted with mild cognitive deficits at time of eval. Treatment initiated and education complete. At this time,  pt noted with cognitive improvements r/t problem solving, safety awareness and memory. At time of d/c, pt demonstrates independence with external aides and memory strategies to increase recall of new information. Pt is independent with medication management with the use of pill organizer. Pt solves complex problems solving tasks with Mod I, no assist.   Care Partner:  Caregiver Able to Provide Assistance: Other (comment) (n/a)     Recommendation:  None      Equipment: none   Reasons for discharge: Treatment goals met   Patient/Family Agrees with Progress Made and Goals Achieved: Yes   See FIM for current functional status  Maleki Hippe, Bernerd Pho 05/20/2013, 5:40 PM

## 2013-05-20 NOTE — Progress Notes (Signed)
Occupational Therapy Discharge Summary  Patient Details  Name: Kathy Calhoun MRN: [id number] Date of Birth: 03/23/1933  Today's Date: 05/20/2013  Patient has met 12 of 12 long term goals due to improved activity tolerance, improved balance, ability to compensate for deficits and functional use of  RIGHT upper and RIGHT lower extremity.  Patient to discharge at overall Modified Independent level. No formal family education was conducted due to pt being at modified independent level.  Pt reports various family members can assist with laundry and family members check in on her often.   Reasons goals not met: N/A  Recommendation:  Patient will benefit from ongoing skilled OT services in home health setting progressing to outpatient to continue to advance functional skills in the area of BADL, iADL and RUE NM Re-ed.  Equipment: No equipment provided  Reasons for discharge: treatment goals met and discharge from hospital  Patient/family agrees with progress made and goals achieved: Yes  OT Discharge Precautions/Restrictions  Precautions Precaution Comments: history of rotator cuff repair and low back pain General   Vital Signs Therapy Vitals Temp: 98.3 F (36.8 C) Temp src: Oral Pulse Rate: 71 Resp: 18 BP: 129/70 mmHg Patient Position, if appropriate: Lying Oxygen Therapy SpO2: 97 % O2 Device: None (Room air) Pain Pain Assessment Pain Assessment: No/denies pain ADL  See FIM Vision/Perception  Vision - History Baseline Vision: Wears glasses only for reading Patient Visual Report: No change from baseline Vision - Assessment Eye Alignment: Within Functional Limits Perception Perception: Within Functional Limits Praxis Praxis: Intact  Cognition Arousal/Alertness: Awake/alert Orientation Level: Oriented X4 Attention: Alternating Alternating Attention: Appears intact Memory: Impaired Memory Impairment: Decreased recall of new information Awareness: Appears  intact Reasoning: Appears intact Organizing: Appears intact Self Monitoring: Appears intact Safety/Judgment: Appears intact Sensation Sensation Light Touch: Impaired Detail Light Touch Impaired Details: Impaired RUE Stereognosis: Not tested Hot/Cold: Not tested Proprioception: Appears Intact Coordination Gross Motor Movements are Fluid and Coordinated: No Fine Motor Movements are Fluid and Coordinated: No Coordination and Movement Description: impaired by weakness and history of Rt rotator cuff repair Finger Nose Finger Test: impaired due to history or Rt rotator cuff repair with decreased shoulder movement Extremity/Trunk Assessment RUE Assessment RUE Assessment: Exceptions to WFL RUE AROM (degrees) RUE Overall AROM Comments: slight shoulder flexion against gravity, requires support to assist with mobility due to rotator cuff repair, elbow WFL, wrist WFL, loose gross grasp, opposition with increased time and effort LUE Assessment LUE Assessment: Within Functional Limits  See FIM for current functional status  HOXIE, SARAH 05/20/2013, 8:38 AM  

## 2013-05-20 NOTE — Progress Notes (Signed)
Occupational Therapy Session Note  Patient Details  Name: Kathy Calhoun MRN: [id number] Date of Birth: 02/03/1934  Today's Date: 05/20/2013 Time: 0730-0812 Time Calculation (min): 42 min  Short Term Goals: No short term goals set  Skilled Therapeutic Interventions/Progress Updates:    Pt completed ADL retraining at overall modified independent level.  Pt ambulated to toilet without AD and completed toileting independently.  Returned to room to gather clothing and necessary items prior to bathing in standing in walk-in shower.  Pt mod I with bathing and dressing at standing level, with exception of sitting to complete LB dressing.  Pt utilized shoe horn to don shoes, reports not planning to use button hook as she has not found it to be helpful.  Completed shirt buttons with increased time.  Therapy Documentation Precautions:  Precautions Precautions: Fall Precaution Comments: history of rotator cuff repair and low back pain Required Braces or Orthoses:  (sling issued for increased support to right upper extremity) Restrictions Weight Bearing Restrictions: No General:   Vital Signs: Therapy Vitals Temp: 98.3 F (36.8 C) Temp src: Oral Pulse Rate: 71 Resp: 18 BP: 129/70 mmHg Patient Position, if appropriate: Lying Oxygen Therapy SpO2: 97 % O2 Device: None (Room air) Pain:  Pt with no c/o pain  See FIM for current functional status  Therapy/Group: Individual Therapy  Simonne Come 05/20/2013, 8:34 AM

## 2013-05-21 MED ORDER — ATORVASTATIN CALCIUM 20 MG PO TABS: 20.0000 mg | ORAL_TABLET | Freq: Every day | ORAL | Status: DC

## 2013-05-21 MED ORDER — ASPIRIN 325 MG PO TABS: 325.0000 mg | ORAL_TABLET | Freq: Every day | ORAL | Status: DC

## 2013-05-21 MED ORDER — DICLOFENAC SODIUM 1 % TD GEL: 2.0000 g | Freq: Three times a day (TID) | TRANSDERMAL | Status: DC

## 2013-05-21 MED ORDER — AMLODIPINE BESYLATE 2.5 MG PO TABS: 2.5000 mg | ORAL_TABLET | Freq: Every day | ORAL | Status: DC

## 2013-05-21 MED ORDER — DULOXETINE HCL 30 MG PO CPEP: 30.0000 mg | ORAL_CAPSULE | Freq: Every day | ORAL | Status: DC

## 2013-05-21 NOTE — Progress Notes (Signed)
Subjective/Complaints: Pt looking forward to d/c , discussed process with pt.  She is pleased with her progress R shoulder pain chronic Review of Systems - Negative except R side weak  Objective: Vital Signs: Blood pressure 124/75, pulse 68, temperature 97.9 F (36.6 C), temperature source Oral, resp. rate 16, height 4\' 11"  (1.499 m), weight 55.1 kg (121 lb 7.6 oz), SpO2 97.00%. Mr Brain Wo Contrast  05/14/2013   ADDENDUM REPORT: 05/14/2013 02:10  ADDENDUM: Acute findings discussed with and reconfirmed by Derrell Lolling, [facility] on May 14, 2013 at 2:10 a.m.   Electronically Signed   By: Elon Alas   On: 05/14/2013 02:10   05/14/2013   CLINICAL DATA:  Port to 5 days of right sided weakness, predominantly right upper extremity hemi paresis.  EXAM: MRI HEAD WITHOUT CONTRAST  MRA HEAD WITHOUT CONTRAST  TECHNIQUE: Multiplanar, multiecho pulse sequences of the brain and surrounding structures were obtained without intravenous contrast. Angiographic images of the head were obtained using MRA technique without contrast.  COMPARISON:  CT HEAD W/O CM dated 05/12/2013  FINDINGS: MRI HEAD FINDINGS  14 mm focus of reduced diffusion within the left posterior frontal lobe, precentral gyrus with corresponding low ADC values. No susceptibility artifact to suggest hemorrhage. However, there are a few scattered subcentimeter focus of susceptibility artifact predominantly central in distribution.  Moderate ventriculomegaly, likely on the basis of global parenchymal brain volume loss. Patchy to come: Predominately supratentorial T2 hyperintensities is seen, with mild patchy pontine T2 hyperintensities. Sub cm remote right cerebellar infarct in addition to patchy T2 hyperintensities within the basal ganglia and thalamus suggesting lacunar infarcts. No midline shift or mass effect.  No abnormal extra-axial fluid collections. Status post bilateral ocular lens implants. No paranasal sinus air-fluid levels, mastoid  air cells are well aerated. Severe left temporomandibular osteoarthrosis with small joint effusion. No abnormal sellar expansion. No cerebellar tonsillar ectopia. Somewhat bright T1 bone marrow signal may reflect osteopenia. There is mild sulcal effacement of the convexities, as seen on prior CT.  MRA HEAD FINDINGS  Anterior circulation: Normal flow related enhancement of the included cervical internal carotid arteries, as well as the petrous, cavernous and supra clinoid internal carotid arteries, mildly filled color ectatic intracranial vessels. Normal appearance of the anterior and middle cerebral arteries.  Posterior circulation: Left vertebral artery is mildly dominant with normal flow related enhancement of the vertebral arteries, vertebrobasilar junction, basilar artery, with a local ectasia. Tiny posterior communicating arteries tube are present. Normal flow early enhancement posterior cerebral arteries. Very slight irregularity of the posterior cerebral arteries.  No large vessel occlusion, hemodynamically significant stenosis, aneurysm, suspicious luminal irregularity.  IMPRESSION: MRI head: Acute 14 mm infarct within the left posterior frontal lobe, precentral gyrus (motor cortex) without hemorrhagic conversion.  Moderate ventriculomegaly, which may be predominately on the basis of brain atrophy, however there is mild sulcal effacement at the convexities which can be seen with normal pressure hydrocephalus.  Moderate to severe white matter changes most consistent with chronic small vessel ischemic disease with patchy bright T2 signal within the basal ganglia and thalamus likely reflecting lacunar infarcts. Scattered foci of susceptibility artifact suggest chronic hypertension.  MRA head: No hemodynamically significant stenosis. Dolichoectasia, which can be seen with chronic hypertension.  Minimal irregularity of the posterior cerebral artery suggest intracranial atherosclerosis.  Electronically Signed: By:  Elon Alas On: 05/14/2013 02:05   Mr Jodene Nam Head/brain Wo Cm  05/14/2013   ADDENDUM REPORT: 05/14/2013 02:10  ADDENDUM: Acute findings discussed with and reconfirmed  by Derrell Lolling, [facility] on May 14, 2013 at 2:10 a.m.   Electronically Signed   By: Elon Alas   On: 05/14/2013 02:10   05/14/2013   CLINICAL DATA:  Port to 5 days of right sided weakness, predominantly right upper extremity hemi paresis.  EXAM: MRI HEAD WITHOUT CONTRAST  MRA HEAD WITHOUT CONTRAST  TECHNIQUE: Multiplanar, multiecho pulse sequences of the brain and surrounding structures were obtained without intravenous contrast. Angiographic images of the head were obtained using MRA technique without contrast.  COMPARISON:  CT HEAD W/O CM dated 05/12/2013  FINDINGS: MRI HEAD FINDINGS  14 mm focus of reduced diffusion within the left posterior frontal lobe, precentral gyrus with corresponding low ADC values. No susceptibility artifact to suggest hemorrhage. However, there are a few scattered subcentimeter focus of susceptibility artifact predominantly central in distribution.  Moderate ventriculomegaly, likely on the basis of global parenchymal brain volume loss. Patchy to come: Predominately supratentorial T2 hyperintensities is seen, with mild patchy pontine T2 hyperintensities. Sub cm remote right cerebellar infarct in addition to patchy T2 hyperintensities within the basal ganglia and thalamus suggesting lacunar infarcts. No midline shift or mass effect.  No abnormal extra-axial fluid collections. Status post bilateral ocular lens implants. No paranasal sinus air-fluid levels, mastoid air cells are well aerated. Severe left temporomandibular osteoarthrosis with small joint effusion. No abnormal sellar expansion. No cerebellar tonsillar ectopia. Somewhat bright T1 bone marrow signal may reflect osteopenia. There is mild sulcal effacement of the convexities, as seen on prior CT.  MRA HEAD FINDINGS  Anterior circulation: Normal  flow related enhancement of the included cervical internal carotid arteries, as well as the petrous, cavernous and supra clinoid internal carotid arteries, mildly filled color ectatic intracranial vessels. Normal appearance of the anterior and middle cerebral arteries.  Posterior circulation: Left vertebral artery is mildly dominant with normal flow related enhancement of the vertebral arteries, vertebrobasilar junction, basilar artery, with a local ectasia. Tiny posterior communicating arteries tube are present. Normal flow early enhancement posterior cerebral arteries. Very slight irregularity of the posterior cerebral arteries.  No large vessel occlusion, hemodynamically significant stenosis, aneurysm, suspicious luminal irregularity.  IMPRESSION: MRI head: Acute 14 mm infarct within the left posterior frontal lobe, precentral gyrus (motor cortex) without hemorrhagic conversion.  Moderate ventriculomegaly, which may be predominately on the basis of brain atrophy, however there is mild sulcal effacement at the convexities which can be seen with normal pressure hydrocephalus.  Moderate to severe white matter changes most consistent with chronic small vessel ischemic disease with patchy bright T2 signal within the basal ganglia and thalamus likely reflecting lacunar infarcts. Scattered foci of susceptibility artifact suggest chronic hypertension.  MRA head: No hemodynamically significant stenosis. Dolichoectasia, which can be seen with chronic hypertension.  Minimal irregularity of the posterior cerebral artery suggest intracranial atherosclerosis.  Electronically Signed: By: Elon Alas On: 05/14/2013 02:05   No results found for this or any previous visit (from the past 72 hour(s)).    Head: Normocephalic.  Eyes: EOM are normal.  Neck: Normal range of motion. Neck supple. No thyromegaly present.  Cardiovascular: Normal rate and regular rhythm.  Respiratory: Effort normal and breath sounds normal. No  respiratory distress.  GI: Soft. Bowel sounds are normal. She exhibits no distension.  Neurological: She is alert and oriented to person, place, and time.  Makes good eye contact with examiner. Follows all commands  Skin: Skin is warm and dry.  Psychiatric: She has a normal mood and affect.  motor strength is  3 minus in the right deltoid, bicep, tricep, grip, finger flexors and extensors  4 minus in the right hip flexor knee extensors 3 minus at the ankle dorsiflexor plantar flexor  5/5 in the left deltoid, bicep and tricep, grip, hip flexor, knee extension, ankle dorsiflexor and plantar flexor   No visual field cuts   Assessment/Plan: 1. Functional deficits secondary to Left post frontal infarct  Stable for D/C today F/u PCP in 1-2 weeks F/u PM&R 3 weeks See D/C summary See D/C instructions FIM: FIM - Bathing Bathing Steps Patient Completed: Chest;Right Arm;Abdomen;Front perineal area;Buttocks;Right upper leg;Left upper leg;Right lower leg (including foot);Left lower leg (including foot);Left Arm Bathing: 6: More than reasonable amount of time  FIM - Upper Body Dressing/Undressing Upper body dressing/undressing steps patient completed: Thread/unthread right sleeve of front closure shirt/dress;Thread/unthread left sleeve of front closure shirt/dress;Pull shirt around back of front closure shirt/dress;Button/unbutton shirt Upper body dressing/undressing: 6: More than reasonable amount of time FIM - Lower Body Dressing/Undressing Lower body dressing/undressing steps patient completed: Thread/unthread right underwear leg;Thread/unthread left underwear leg;Thread/unthread right pants leg;Thread/unthread left pants leg;Pull underwear up/down;Pull pants up/down;Don/Doff right sock;Don/Doff left sock;Don/Doff right shoe;Don/Doff left shoe Lower body dressing/undressing: 6: Assistive device (Comment)  FIM - Toileting Toileting steps completed by patient: Adjust clothing prior to  toileting;Performs perineal hygiene;Adjust clothing after toileting Toileting Assistive Devices: Grab bar or rail for support Toileting: 7: Independent: No helper, no device  FIM - Radio producer Devices: Grab bars Toilet Transfers: 6-Assistive device: No helper;6-To toilet/ BSC;6-From toilet/BSC  FIM - Control and instrumentation engineer Devices: Arm rests Bed/Chair Transfer: 6: Assistive device: no helper;6: Supine > Sit: No assist;6: Sit > Supine: No assist;6: Bed > Chair or W/C: No assist;6: Chair or W/C > Bed: No assist  FIM - Locomotion: Wheelchair Distance: 150 Locomotion: Wheelchair: 0: Activity did not occur FIM - Locomotion: Ambulation Locomotion: Ambulation Assistive Devices:  (none) Ambulation/Gait Assistance: 6: Modified independent (Device/Increase time) Locomotion: Ambulation: 6: Travels 150 ft or more with assistive device/no helper  Comprehension Comprehension Mode: Auditory Comprehension: 7-Follows complex conversation/direction: With no assist  Expression Expression Mode: Verbal Expression: 7-Expresses complex ideas: With no assist  Social Interaction Social Interaction: 6-Interacts appropriately with others with medication or extra time (anti-anxiety, antidepressant).  Problem Solving Problem Solving: 6-Solves complex problems: With extra time  Memory Memory: 6-Assistive device: No helper  Medical Problem List and Plan:  1. DVT Prophylaxis/Anticoagulation: Pharmaceutical: Lovenox  2. Pain Management:   cymbalta for Chronic LBP  Will use tylenol prn. Add voltaren gel for shoulder pain.  3. Mood: Denies depressed mood. LCSW to follow for evaluation.  4. Neuropsych: This patient is capable of making decisions on her own behalf.  5. HTN: Will monitor with bid checks. Monitor for orthostasis.  6. Dyslipidemia: zocor.  7.  Hx osteoporosis Ca and Vit D   LOS (Days) 7 A FACE TO FACE EVALUATION WAS  PERFORMED  Eilene Voigt E 05/21/2013, 8:11 AM

## 2013-05-21 NOTE — Progress Notes (Signed)
Discharge summary [id number] dictated.

## 2013-05-21 NOTE — Progress Notes (Signed)
Patient and family received discharge instructions from Algis Liming, PA with verbal understanding. Patient discharged to home with family and belongings.

## 2013-05-21 NOTE — Progress Notes (Signed)
Social Work Discharge Note Discharge Note  The overall goal for the admission was met for:   Discharge location: Yes-HOME WITH INTERMITTENT ASSIST FORM ADOPTED SON AND NEIGHBORS  Length of Stay: Yes-7 DAYS  Discharge activity level: Yes-MOD/I LEVEL  Home/community participation: Yes  Services provided included: MD, RD, PT, OT, SLP, RN, CM, TR, Pharmacy, Neuropsych and SW  Financial Services: Medicare and Private Insurance: [facility]  Follow-up services arranged: Home Health: [facility]-PT,OT and Patient/Family has no preference for HH/DME agencies  Comments (or additional information):PT RECOVERED WELL FROM HER CVA, READY TO [place]. COULD NOT ARRANGE TRANSPORTATION SO NEEDED TO DO HOME HEALTH THERAPIES  Patient/Family verbalized understanding of follow-up arrangements: Yes  Individual responsible for coordination of the follow-up plan: SELF  Confirmed correct DME delivered: Elease Hashimoto 05/21/2013    Elease Hashimoto

## 2013-05-21 NOTE — Progress Notes (Signed)
Social Work Elease Hashimoto, LCSW Social Worker Signed  Patient Care Conference Service date: 05/21/2013 12:36 PM  Inpatient RehabilitationTeam Conference and Plan of Care Update Date: 05/21/2013   Time: 12;00 Pm     Patient Name: Kathy Calhoun       Medical Record Number: [id number]   Date of Birth: [birth year] Sex: Female         Room/Bed: 4M09C/4M09C-01 Payor Info: Payor: MEDICARE / Plan: MEDICARE PART A AND B / Product Type: *No Product type* /   Admitting Diagnosis: L CVA   Admit Date/Time:  05/14/2013  6:07 PM Admission Comments: No comment available   Primary Diagnosis:  CVA (cerebral infarction) Principal Problem: CVA (cerebral infarction)    Patient Active Problem List     Diagnosis  Date Noted   .  Dyslipidemia  05/15/2013   .  Noncompliance  05/14/2013   .  Essential hypertension, benign  05/13/2013   .  Depression  05/13/2013   .  CVA (cerebral infarction)  05/12/2013     Expected Discharge Date: Expected Discharge Date: 05/21/13  Team Members Present: Physician leading conference: Dr. Alysia Penna Social Worker Present: Ovidio Kin, LCSW Nurse Present: Nanine Means, RN PT Present: Raylene Everts, PT;Emily Rinaldo Cloud, PT OT Present: Simonne Come, Lorelee Cover, OT SLP Present: Gunnar Fusi, SLP PPS Coordinator present : Ileana Ladd, Lelan Pons, RN, CRRN        Current Status/Progress  Goal  Weekly Team Focus   Medical     medically stable  ready for discharge     Bowel/Bladder     Continent of bowel and bladder  Remain continent of bowel and bladder     Swallow/Nutrition/ Hydration     WFL       ADL's     mod I overall  goals met  higher level balance and RUE NM re-ed   Mobility     Mod I overall  Goals met  Community outing, higher level balance and gait training   Communication     at baseline  at baseline  n/a   Safety/Cognition/ Behavioral Observations    Mod I with complex problem solving  Mod I  discharge planning   Pain     Pt  complains of intermittent right shoulder pain from Rotator cuff repair  Remain free of pain or minimize pain to level acceptable for patient     Skin     No skinn issues noted  remain free of skin breakdown       *See Care Plan and progress notes for long and short-term goals.    Barriers to Discharge:  limited supports    Possible Resolutions to Barriers:    reach mod/i level    Discharge Planning/Teaching Needs:    Home with intermittent assist from neighbors and adopted son.    Team Discussion:    Pt did well and recovered quickly from this CVA.  Met goals of mod/i level   Revisions to Treatment Plan:    None      Elease Hashimoto 05/21/2013, 4:12 PM          Patient ID: [name], female   DOB: 1933-06-23, 80 y.o.   MRN: [id number]

## 2013-05-21 NOTE — Patient Care Conference (Signed)
Inpatient RehabilitationTeam Conference and Plan of Care Update Date: 05/21/2013   Time: 12;00 Pm    Patient Name: Kathy Calhoun      Medical Record Number: [id number]  Date of Birth: [birth year] Sex: Female         Room/Bed: 4M09C/4M09C-01 Payor Info: Payor: MEDICARE / Plan: MEDICARE PART A AND B / Product Type: *No Product type* /    Admitting Diagnosis: L CVA  Admit Date/Time:  05/14/2013  6:07 PM Admission Comments: No comment available   Primary Diagnosis:  CVA (cerebral infarction) Principal Problem: CVA (cerebral infarction)  Patient Active Problem List   Diagnosis Date Noted  . Dyslipidemia 05/15/2013  . Noncompliance 05/14/2013  . Essential hypertension, benign 05/13/2013  . Depression 05/13/2013  . CVA (cerebral infarction) 05/12/2013    Expected Discharge Date: Expected Discharge Date: 05/21/13  Team Members Present: Physician leading conference: Dr. Alysia Penna Social Worker Present: Ovidio Kin, LCSW Nurse Present: Nanine Means, RN PT Present: Raylene Everts, PT;Emily Rinaldo Cloud, PT OT Present: Simonne Come, Lorelee Cover, OT SLP Present: Gunnar Fusi, SLP PPS Coordinator present : Ileana Ladd, Lelan Pons, RN, CRRN     Current Status/Progress Goal Weekly Team Focus  Medical     medically stable   ready for discharge     Bowel/Bladder   Continent of bowel and bladder  Remain continent of bowel and bladder      Swallow/Nutrition/ Hydration     WFL        ADL's   mod I overall  goals met  higher level balance and RUE NM re-ed   Mobility   Mod I overall  Goals met  Community outing, higher level balance and gait training   Communication   at baseline  at baseline  n/a   Safety/Cognition/ Behavioral Observations  Mod I with complex problem solving  Mod I  discharge planning   Pain   Pt complains of intermittent right shoulder pain from Rotator cuff repair  Remain free of pain or minimize pain to level acceptable for patient      Skin   No  skinn issues noted  remain free of skin breakdown         *See Care Plan and progress notes for long and short-term goals.  Barriers to Discharge:   limited supports   Possible Resolutions to Barriers:    reach mod/i level   Discharge Planning/Teaching Needs:    Home with intermittent assist from neighbors and adopted son.     Team Discussion:  Pt did well and recovered quickly from this CVA.  Met goals of mod/i level  Revisions to Treatment Plan:  None      Lyanne Kates, Gardiner Rhyme 05/21/2013, 4:12 PM

## 2013-05-22 NOTE — Discharge Summary (Signed)
Kathy, Calhoun                 ACCOUNT NO.:  [id number]  MEDICAL RECORD NO.:  [id number]  LOCATION:  4M09C                        FACILITY:  [facility]  PHYSICIAN:  Charlett Blake, M.D.DATE OF BIRTH:  [birth year]  DATE OF ADMISSION:  05/14/2013 DATE OF DISCHARGE:  05/21/2013                              DISCHARGE SUMMARY   DISCHARGE DIAGNOSES: 1. Left posterior frontal lobe infarct. 2. Hypertension. 3. Dyslipidemia. 4. History of osteoporosis.  HISTORY OF PRESENT ILLNESS:  Kathy Calhoun is a 80 year old female with history of hypertension, chronic low back pain, as well as right rotator cuff problems who was admitted on May 12, 2013, with 4-5 day history of right-sided weakness, right facial numbness, as well as difficulty walking.  MRI showed acute infarct in the left posterior frontal lobe and precentral gyrus.  MRA of head showed no stenosis.  2D echo showed EF of 55% with grade 1 diastolic dysfunction.  Carotid Dopplers without significant ICA stenosis.  The patient was placed on aspirin for stroke prevention and she was admitted to rehab to see IR for progressive therapies.  PAST MEDICAL HISTORY:  Osteoporosis, hypertension, lumbar HNP.  ALLERGIES:  To CODEINE and SULFA.  SOCIAL HISTORY:  The patient was widowed in November 2014.  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  Lives with a 20 year old adopted son as well as another grandson who is there over the weekend.  She does not use any tobacco, alcohol, or illicit drugs.  LABORATORY DATA:  Check of lytes revealed sodium 138, potassium 4.5, chloride 98, CO2 of 25, BUN 20, creatinine 0.81, glucose 96.  CBC reveals hemoglobin 13.4, hematocrit 39.1, white count 6.4, platelets 287.  Urine culture showed greater than 100,000 colonies of multi- species.  HOSPITAL COURSE:  [name] was admitted to Rehab on May 14, 2013, for inpatient therapies to consist of PT, OT, and speech therapy at  least 3 hours 5 days a week.  Past admission, physiatrist, rehab, RN, and therapy team have worked together to provide customized collaborative interdisciplinary care.  Rehab RN has worked with the patient on bowel and bladder program.  Blood pressures have been monitored on b.i.d. basis and have been reasonably controlled on low- dose Norvasc.  Systolics are ranging from 120s to 540J and diastolic have been in 81X range.  No orthostasis or complaints of dizziness reported during this stay.  Calcium supplement as well as vitamin D was added due to her osteoporosis.  The patient reports being noncompliant with meds for the last few months and she did report of having some depressed mood due to the loss of her husband.  She was willing to resume and try Cymbalta for mood stabilization.  Dr. McDiarmid from Psychiatry was consulted and has followed up for evaluation.  No major behavioral personality changes were endorsed, and the patient reported having multiple psychological stressors.  She was encouraged to pursue counseling on an outpatient basis.  The patient has been continent of bowel and bladder.  She has made good progress and is modified independent for transfers and ambulation.  She requires supervision in community setting.  She requires supervision  for stair navigation.  Speech Therapy has been working with the patient on complex problem-solving as well as utilization of memory aids to recall new and daily information.  Currently, the patient is modified independent for complex tasks.  No followup speech therapy needed.  She will continue to receive followup home health, PT/OT by [place] past discharge.  DISCHARGE MEDICATIONS: 1. Diclofenac gel to right shoulder t.i.d. 2. Norvasc 2.5 mg p.o. per day. 3. Aspirin 325 mg p.o. per day. 4. Lipitor 20 mg p.o. per day. 5. Caltrate plus vitamin D 1 p.o. per day. 6. Vitamin D 2000 units p.o. per day. 7. Cymbalta 30 mg p.o.  per day. 8. Multivitamin 1 p.o. per day.  DIET:  Heart-healthy.  ACTIVITY LEVEL:  As tolerated.  SPECIAL INSTRUCTIONS:  [facility] to provide PT/OT.  No driving.  FOLLOWUP:  The patient to follow up with Dr. Letta Pate on April 13th at 1 p.m.  Follow up with Dr. Lajean Manes for posthospital followup on March 24th at 4:15 p.m.  Follow up with Dr. Antony Contras in 6 weeks.     Reesa Chew, P.A.   ______________________________ Charlett Blake, M.D.    PL/MEDQ  D:  05/21/2013  T:  05/22/2013  Job:  [id number]  cc:   Pramod P. Leonie Man, MD Hal T. Stoneking, M.D.

## 2013-05-23 DIAGNOSIS — I10 Essential (primary) hypertension: Secondary | ICD-10-CM | POA: Diagnosis not present

## 2013-05-23 DIAGNOSIS — Z5189 Encounter for other specified aftercare: Secondary | ICD-10-CM | POA: Diagnosis not present

## 2013-05-23 DIAGNOSIS — M810 Age-related osteoporosis without current pathological fracture: Secondary | ICD-10-CM | POA: Diagnosis not present

## 2013-05-23 DIAGNOSIS — I69959 Hemiplegia and hemiparesis following unspecified cerebrovascular disease affecting unspecified side: Secondary | ICD-10-CM | POA: Diagnosis not present

## 2013-05-23 DIAGNOSIS — M25519 Pain in unspecified shoulder: Secondary | ICD-10-CM | POA: Diagnosis not present

## 2013-05-26 DIAGNOSIS — M25519 Pain in unspecified shoulder: Secondary | ICD-10-CM | POA: Diagnosis not present

## 2013-05-26 DIAGNOSIS — I10 Essential (primary) hypertension: Secondary | ICD-10-CM | POA: Diagnosis not present

## 2013-05-26 DIAGNOSIS — Z5189 Encounter for other specified aftercare: Secondary | ICD-10-CM | POA: Diagnosis not present

## 2013-05-26 DIAGNOSIS — I69959 Hemiplegia and hemiparesis following unspecified cerebrovascular disease affecting unspecified side: Secondary | ICD-10-CM | POA: Diagnosis not present

## 2013-05-26 DIAGNOSIS — M810 Age-related osteoporosis without current pathological fracture: Secondary | ICD-10-CM | POA: Diagnosis not present

## 2013-05-26 NOTE — Discharge Summary (Signed)
Physician Discharge Summary   Kathy Calhoun EGB:[id number] DOB: [birth year] DOA: 05/12/2013  PCP: Mathews Argyle, MD  Admit date: 05/12/2013  Discharge date: 05/14/2013  Time spent: greater than 30 minutes  Recommendations for Outpatient Follow-up:  1. Check LFTs in one month Discharge Diagnoses:  Principal Problem:  CVA (cerebral infarction)  Active Problems:  Essential hypertension, benign  Depression  Noncompliance  asymptomatic bacteriuria  Discharge Condition: stable  Disposition: to CIR  Filed Weights    05/12/13 1217   Weight:  54.432 kg (120 lb)    History of present illness:  80 y.o. female presenting with CVA. Per pt, she has had 4-5 days of R sided, but predominantly RUE hemiparesis. Pt has a baseline hx/o R shoulder pain s/p rotator cuff surgery several years ago. Pt states that she was trying to sew when she felt her arm fall asleep 4-5 days ago and this has persisted. Pt has also had some RLE weakness to the point that she is scared to walk. Pt denies any falls at home. Denies any prior hx/o stroke in the past. Non smoker. Non diabetic. Does report a prior hx/o HTN. Her BP medication was d/c'd by PCP last year secondary to falls. Pt does not remember name of medication. Pt not on ASA prior to admission. Had been off medications for several months after the death of her husband  Pt was brought to [facility] ER for further evaluation by son. Was noted to have questionable L parietal lobe CVA on head CT. Not a candidate for TPA due to delay in presentation.  Hospital Course:  Patient was transferred to neuro telemetry unit at [facility]. MRI confirmed acute ischemic stroke. Patient was started on aspirin. LDL of 100 and was started on statin. Antihypertensives were resumed. Echocardiogram shows no source of embolus. Carotid Doppler showed no critical stenosis. Hemoglobin A1c 5.8. Patient has been evaluated by physical therapy occupational therapy and speech therapy. They  recommended inpatient rehabilitation and she has been accepted for transfer today. She has 3-4/5 strength in her right hand and leg at discharge. Medically stable.  Urinalysis showed few bacteria, 7-10 white cells, moderate nitrite, small leukocyte esterase, but patient has no dysuria, fevers, chills. Was not treated for UTI but should symptoms develop, may need to start antibiotics.  Procedures:  None Consultations:  Physical medicine and rehabilitation Discharge Exam:  Filed Vitals:    05/14/13 1416   BP:  155/73   Pulse:  85   Temp:  97.8 F (36.6 C)   Resp:  20    General: Alert, oriented  Cardiovascular: Regular rate rhythm  Respiratory: Clear to auscultation bilaterally  Neuro: Cranial nerves intact. Speech clear and fluent. Right arm and leg strength 3-4/5  Psychiatric: Normal affect  Discharge Instructions  Discharge Orders    Future Orders  Complete By  Expires    Diet - low sodium heart healthy  As directed     Walk with assistance  As directed         Medication List     STOP taking these medications       estrogens (conjugated) 0.9 MG tablet    Commonly known as: PREMARIN    meloxicam 15 MG tablet    Commonly known as: MOBIC     TAKE these medications       amLODipine 2.5 MG tablet    Commonly known as: NORVASC    Take 2.5 mg by mouth daily.    aspirin 325 MG  tablet    Take 1 tablet (325 mg total) by mouth daily.    atorvastatin 20 MG tablet    Commonly known as: LIPITOR    Take 1 tablet (20 mg total) by mouth daily.    CALTRATE 600 PLUS-VIT D PO    Take 1 tablet by mouth daily.    cholecalciferol 1000 UNITS tablet    Commonly known as: VITAMIN D    Take 2,000 Units by mouth daily.    DULoxetine 30 MG capsule    Commonly known as: CYMBALTA    Take 30 mg by mouth daily.    hydrochlorothiazide 12.5 MG capsule    Commonly known as: MICROZIDE    Take 12.5 mg by mouth daily.    multivitamin with minerals tablet    Take 1 tablet by mouth daily.       Allergies   Allergen  Reactions   .  Codeine  Rash   .  Sulfa Antibiotics  Rash      The results of significant diagnostics from this hospitalization (including imaging, microbiology, ancillary and laboratory) are listed below for reference.   Significant Diagnostic Studies:  Dg Chest 2 View  05/13/2013 CLINICAL DATA: Stroke. EXAM: CHEST 2 VIEW COMPARISON: None available for comparison at time of study interpretation. FINDINGS: The cardiac silhouette is mildly enlarged, mediastinal silhouette is nonsuspicious, mildly calcified aortic knob. Increased lung volumes with chronic interstitial changes. Biapical calcified pleural plaque. Linear density left lung base. No pleural effusions. No pneumothorax. Widened right acromioclavicular joint may reflect remote injury. Multiple EKG lines overlie the patient and may obscure subtle underlying pathology. Moderate degenerative change of the lumbar spine with thoracolumbar levoscoliosis. IMPRESSION: Mild cardiomegaly and COPD. Linear densities in left lung base may reflect atelectasis or scarring. Electronically Signed By: Elon Alas On: 05/13/2013 06:04  Ct Head Wo Contrast  05/12/2013 CLINICAL DATA: Right arm weakness since Friday EXAM: CT HEAD WITHOUT CONTRAST TECHNIQUE: Contiguous axial images were obtained from the base of the skull through the vertex without intravenous contrast. COMPARISON: None. FINDINGS: Moderate diffuse atrophy. Severe bilateral low attenuation in the deep periventricular white matter. There is no vascular territory infarct. There is no hemorrhage or extra-axial fluid. There is no mass effect or hydrocephalus. In the left corona radiata above the level of the ventricles and the parietal lobe there is a 13 mm oval focus of low attenuation. IMPRESSION: Significant chronic involutional change with evidence of chronic ischemia in the white matter. More focal oval area of low-attenuation left parietal lobe deep white matter above the  level of the ventricles. This could represent asymmetric ischemic change, but the possibility of subacute lacunar infarction is not excluded. Consider MRI if indicated to better evaluate. Electronically Signed By: Skipper Cliche M.D. On: 05/12/2013 13:56  Mr Brain Wo Contrast  05/14/2013 ADDENDUM REPORT: 05/14/2013 02:10 ADDENDUM: Acute findings discussed with and reconfirmed by Derrell Lolling, [facility] on May 14, 2013 at 2:10 a.m. Electronically Signed By: Elon Alas On: 05/14/2013 02:10  05/14/2013 CLINICAL DATA: Port to 5 days of right sided weakness, predominantly right upper extremity hemi paresis. EXAM: MRI HEAD WITHOUT CONTRAST MRA HEAD WITHOUT CONTRAST TECHNIQUE: Multiplanar, multiecho pulse sequences of the brain and surrounding structures were obtained without intravenous contrast. Angiographic images of the head were obtained using MRA technique without contrast. COMPARISON: CT HEAD W/O CM dated 05/12/2013 FINDINGS: MRI HEAD FINDINGS 14 mm focus of reduced diffusion within the left posterior frontal lobe, precentral gyrus with corresponding low ADC values. No susceptibility  artifact to suggest hemorrhage. However, there are a few scattered subcentimeter focus of susceptibility artifact predominantly central in distribution. Moderate ventriculomegaly, likely on the basis of global parenchymal brain volume loss. Patchy to come: Predominately supratentorial T2 hyperintensities is seen, with mild patchy pontine T2 hyperintensities. Sub cm remote right cerebellar infarct in addition to patchy T2 hyperintensities within the basal ganglia and thalamus suggesting lacunar infarcts. No midline shift or mass effect. No abnormal extra-axial fluid collections. Status post bilateral ocular lens implants. No paranasal sinus air-fluid levels, mastoid air cells are well aerated. Severe left temporomandibular osteoarthrosis with small joint effusion. No abnormal sellar expansion. No cerebellar tonsillar ectopia.  Somewhat bright T1 bone marrow signal may reflect osteopenia. There is mild sulcal effacement of the convexities, as seen on prior CT. MRA HEAD FINDINGS Anterior circulation: Normal flow related enhancement of the included cervical internal carotid arteries, as well as the petrous, cavernous and supra clinoid internal carotid arteries, mildly filled color ectatic intracranial vessels. Normal appearance of the anterior and middle cerebral arteries. Posterior circulation: Left vertebral artery is mildly dominant with normal flow related enhancement of the vertebral arteries, vertebrobasilar junction, basilar artery, with a local ectasia. Tiny posterior communicating arteries tube are present. Normal flow early enhancement posterior cerebral arteries. Very slight irregularity of the posterior cerebral arteries. No large vessel occlusion, hemodynamically significant stenosis, aneurysm, suspicious luminal irregularity. IMPRESSION: MRI head: Acute 14 mm infarct within the left posterior frontal lobe, precentral gyrus (motor cortex) without hemorrhagic conversion. Moderate ventriculomegaly, which may be predominately on the basis of brain atrophy, however there is mild sulcal effacement at the convexities which can be seen with normal pressure hydrocephalus. Moderate to severe white matter changes most consistent with chronic small vessel ischemic disease with patchy bright T2 signal within the basal ganglia and thalamus likely reflecting lacunar infarcts. Scattered foci of susceptibility artifact suggest chronic hypertension. MRA head: No hemodynamically significant stenosis. Dolichoectasia, which can be seen with chronic hypertension. Minimal irregularity of the posterior cerebral artery suggest intracranial atherosclerosis. Electronically Signed: By: Elon Alas On: 05/14/2013 02:05  Mr Jodene Nam Head/brain Wo Cm  05/14/2013 ADDENDUM REPORT: 05/14/2013 02:10 ADDENDUM: Acute findings discussed with and reconfirmed by  Derrell Lolling, [facility] on May 14, 2013 at 2:10 a.m. Electronically Signed By: Elon Alas On: 05/14/2013 02:10  05/14/2013 CLINICAL DATA: Port to 5 days of right sided weakness, predominantly right upper extremity hemi paresis. EXAM: MRI HEAD WITHOUT CONTRAST MRA HEAD WITHOUT CONTRAST TECHNIQUE: Multiplanar, multiecho pulse sequences of the brain and surrounding structures were obtained without intravenous contrast. Angiographic images of the head were obtained using MRA technique without contrast. COMPARISON: CT HEAD W/O CM dated 05/12/2013 FINDINGS: MRI HEAD FINDINGS 14 mm focus of reduced diffusion within the left posterior frontal lobe, precentral gyrus with corresponding low ADC values. No susceptibility artifact to suggest hemorrhage. However, there are a few scattered subcentimeter focus of susceptibility artifact predominantly central in distribution. Moderate ventriculomegaly, likely on the basis of global parenchymal brain volume loss. Patchy to come: Predominately supratentorial T2 hyperintensities is seen, with mild patchy pontine T2 hyperintensities. Sub cm remote right cerebellar infarct in addition to patchy T2 hyperintensities within the basal ganglia and thalamus suggesting lacunar infarcts. No midline shift or mass effect. No abnormal extra-axial fluid collections. Status post bilateral ocular lens implants. No paranasal sinus air-fluid levels, mastoid air cells are well aerated. Severe left temporomandibular osteoarthrosis with small joint effusion. No abnormal sellar expansion. No cerebellar tonsillar ectopia. Somewhat bright T1 bone marrow signal may reflect osteopenia.  There is mild sulcal effacement of the convexities, as seen on prior CT. MRA HEAD FINDINGS Anterior circulation: Normal flow related enhancement of the included cervical internal carotid arteries, as well as the petrous, cavernous and supra clinoid internal carotid arteries, mildly filled color ectatic intracranial  vessels. Normal appearance of the anterior and middle cerebral arteries. Posterior circulation: Left vertebral artery is mildly dominant with normal flow related enhancement of the vertebral arteries, vertebrobasilar junction, basilar artery, with a local ectasia. Tiny posterior communicating arteries tube are present. Normal flow early enhancement posterior cerebral arteries. Very slight irregularity of the posterior cerebral arteries. No large vessel occlusion, hemodynamically significant stenosis, aneurysm, suspicious luminal irregularity. IMPRESSION: MRI head: Acute 14 mm infarct within the left posterior frontal lobe, precentral gyrus (motor cortex) without hemorrhagic conversion. Moderate ventriculomegaly, which may be predominately on the basis of brain atrophy, however there is mild sulcal effacement at the convexities which can be seen with normal pressure hydrocephalus. Moderate to severe white matter changes most consistent with chronic small vessel ischemic disease with patchy bright T2 signal within the basal ganglia and thalamus likely reflecting lacunar infarcts. Scattered foci of susceptibility artifact suggest chronic hypertension. MRA head: No hemodynamically significant stenosis. Dolichoectasia, which can be seen with chronic hypertension. Minimal irregularity of the posterior cerebral artery suggest intracranial atherosclerosis. Electronically Signed: By: Elon Alas On: 05/14/2013 02:05  Echocardiogram,  Left ventricle: The cavity size was normal. Systolic function was normal. The estimated ejection fraction was 55%. Images were inadequate for LV wall motion assessment. There was an increased relative contribution of atrial contraction to ventricular filling. Doppler parameters are consistent with abnormal left ventricular relaxation (grade 1 diastolic dysfunction). - Aortic valve: The AV is probably trileaflet with 1 cusp heavily calcified and immobile but cannot rule  out bicuspid valve from this study A bicuspid morphology cannot be excluded. Moderate diffuse thickening and calcification. - Pulmonary arteries: PA peak pressure: 5mm Hg (S).  Carotid Dopplers  Findings suggest 1-39% internal carotid artery stenosis bilaterally. Vertebral arteries are patent with antegrade flow.   Microbiology:  Recent Results (from the past 240 hour(s))   URINE CULTURE Status: None    Collection Time    05/12/13 11:30 PM   Result  Value  Ref Range  Status    Specimen Description  URINE, RANDOM   Final    Special Requests  NONE   Final    Culture Setup Time    Final    Value:  05/12/2013 23:48     Performed at [facility]    Final    Value:  NO GROWTH     Performed at [facility]    Culture    Final    Value:  NO GROWTH     Performed at [facility]    Report Status  05/14/2013 FINAL   Final    Labs:  Basic Metabolic Panel:   Recent Labs  Lab  05/12/13 1320  05/12/13 2000   NA  143  --   K  3.8  --   CL  102  --   CO2  26  --   GLUCOSE  115*  --   BUN  19  --   CREATININE  0.80  0.89   CALCIUM  10.2  --    Liver Function Tests:   Recent Labs  Lab  05/12/13 1320   AST  20   ALT  15   ALKPHOS  84   BILITOT  0.4   PROT  7.8   ALBUMIN  4.0    No results found for this basename: LIPASE, AMYLASE, in the last 168 hours  No results found for this basename: AMMONIA, in the last 168 hours  CBC:   Recent Labs  Lab  05/12/13 1320   WBC  6.4   NEUTROABS  3.0   HGB  14.2   HCT  42.6   MCV  93.6   PLT  290    Cardiac Enzymes:   Recent Labs  Lab  05/12/13 1320   TROPONINI  <0.30    BNP:  BNP (last 3 results)  No results found for this basename: PROBNP, in the last 8760 hours  CBG:   Recent Labs  Lab  05/12/13 1409   GLUCAP  115*    Signed:  Berlin L  [facility]  05/14/2013, 2:39 PM

## 2013-05-27 DIAGNOSIS — I10 Essential (primary) hypertension: Secondary | ICD-10-CM | POA: Diagnosis not present

## 2013-05-27 DIAGNOSIS — M25519 Pain in unspecified shoulder: Secondary | ICD-10-CM | POA: Diagnosis not present

## 2013-05-27 DIAGNOSIS — M810 Age-related osteoporosis without current pathological fracture: Secondary | ICD-10-CM | POA: Diagnosis not present

## 2013-05-27 DIAGNOSIS — Z5189 Encounter for other specified aftercare: Secondary | ICD-10-CM | POA: Diagnosis not present

## 2013-05-27 DIAGNOSIS — I69959 Hemiplegia and hemiparesis following unspecified cerebrovascular disease affecting unspecified side: Secondary | ICD-10-CM | POA: Diagnosis not present

## 2013-05-29 DIAGNOSIS — M25519 Pain in unspecified shoulder: Secondary | ICD-10-CM | POA: Diagnosis not present

## 2013-05-29 DIAGNOSIS — I69959 Hemiplegia and hemiparesis following unspecified cerebrovascular disease affecting unspecified side: Secondary | ICD-10-CM | POA: Diagnosis not present

## 2013-05-29 DIAGNOSIS — M810 Age-related osteoporosis without current pathological fracture: Secondary | ICD-10-CM | POA: Diagnosis not present

## 2013-05-29 DIAGNOSIS — Z5189 Encounter for other specified aftercare: Secondary | ICD-10-CM | POA: Diagnosis not present

## 2013-05-29 DIAGNOSIS — I10 Essential (primary) hypertension: Secondary | ICD-10-CM | POA: Diagnosis not present

## 2013-05-30 DIAGNOSIS — M25519 Pain in unspecified shoulder: Secondary | ICD-10-CM

## 2013-05-30 DIAGNOSIS — M810 Age-related osteoporosis without current pathological fracture: Secondary | ICD-10-CM | POA: Diagnosis not present

## 2013-05-30 DIAGNOSIS — I69959 Hemiplegia and hemiparesis following unspecified cerebrovascular disease affecting unspecified side: Secondary | ICD-10-CM | POA: Diagnosis not present

## 2013-05-30 DIAGNOSIS — I10 Essential (primary) hypertension: Secondary | ICD-10-CM | POA: Diagnosis not present

## 2013-05-30 DIAGNOSIS — Z5189 Encounter for other specified aftercare: Secondary | ICD-10-CM | POA: Diagnosis not present

## 2013-06-02 DIAGNOSIS — M810 Age-related osteoporosis without current pathological fracture: Secondary | ICD-10-CM | POA: Diagnosis not present

## 2013-06-02 DIAGNOSIS — M25519 Pain in unspecified shoulder: Secondary | ICD-10-CM | POA: Diagnosis not present

## 2013-06-02 DIAGNOSIS — I10 Essential (primary) hypertension: Secondary | ICD-10-CM | POA: Diagnosis not present

## 2013-06-02 DIAGNOSIS — I69959 Hemiplegia and hemiparesis following unspecified cerebrovascular disease affecting unspecified side: Secondary | ICD-10-CM | POA: Diagnosis not present

## 2013-06-02 DIAGNOSIS — Z5189 Encounter for other specified aftercare: Secondary | ICD-10-CM | POA: Diagnosis not present

## 2013-06-03 DIAGNOSIS — Z8673 Personal history of transient ischemic attack (TIA), and cerebral infarction without residual deficits: Secondary | ICD-10-CM | POA: Diagnosis not present

## 2013-06-03 DIAGNOSIS — I69959 Hemiplegia and hemiparesis following unspecified cerebrovascular disease affecting unspecified side: Secondary | ICD-10-CM | POA: Diagnosis not present

## 2013-06-03 DIAGNOSIS — I10 Essential (primary) hypertension: Secondary | ICD-10-CM | POA: Diagnosis not present

## 2013-06-03 DIAGNOSIS — Z5189 Encounter for other specified aftercare: Secondary | ICD-10-CM | POA: Diagnosis not present

## 2013-06-03 DIAGNOSIS — M25519 Pain in unspecified shoulder: Secondary | ICD-10-CM | POA: Diagnosis not present

## 2013-06-03 DIAGNOSIS — E7800 Pure hypercholesterolemia, unspecified: Secondary | ICD-10-CM | POA: Diagnosis not present

## 2013-06-03 DIAGNOSIS — M810 Age-related osteoporosis without current pathological fracture: Secondary | ICD-10-CM | POA: Diagnosis not present

## 2013-06-04 DIAGNOSIS — M25519 Pain in unspecified shoulder: Secondary | ICD-10-CM | POA: Diagnosis not present

## 2013-06-04 DIAGNOSIS — I10 Essential (primary) hypertension: Secondary | ICD-10-CM | POA: Diagnosis not present

## 2013-06-04 DIAGNOSIS — Z5189 Encounter for other specified aftercare: Secondary | ICD-10-CM | POA: Diagnosis not present

## 2013-06-04 DIAGNOSIS — M810 Age-related osteoporosis without current pathological fracture: Secondary | ICD-10-CM | POA: Diagnosis not present

## 2013-06-04 DIAGNOSIS — I69959 Hemiplegia and hemiparesis following unspecified cerebrovascular disease affecting unspecified side: Secondary | ICD-10-CM | POA: Diagnosis not present

## 2013-06-05 DIAGNOSIS — Z5189 Encounter for other specified aftercare: Secondary | ICD-10-CM | POA: Diagnosis not present

## 2013-06-05 DIAGNOSIS — I10 Essential (primary) hypertension: Secondary | ICD-10-CM | POA: Diagnosis not present

## 2013-06-05 DIAGNOSIS — M25519 Pain in unspecified shoulder: Secondary | ICD-10-CM | POA: Diagnosis not present

## 2013-06-05 DIAGNOSIS — I69959 Hemiplegia and hemiparesis following unspecified cerebrovascular disease affecting unspecified side: Secondary | ICD-10-CM | POA: Diagnosis not present

## 2013-06-05 DIAGNOSIS — M810 Age-related osteoporosis without current pathological fracture: Secondary | ICD-10-CM | POA: Diagnosis not present

## 2013-06-06 DIAGNOSIS — M25819 Other specified joint disorders, unspecified shoulder: Secondary | ICD-10-CM | POA: Diagnosis not present

## 2013-06-06 DIAGNOSIS — M19019 Primary osteoarthritis, unspecified shoulder: Secondary | ICD-10-CM | POA: Diagnosis not present

## 2013-06-10 DIAGNOSIS — I69959 Hemiplegia and hemiparesis following unspecified cerebrovascular disease affecting unspecified side: Secondary | ICD-10-CM | POA: Diagnosis not present

## 2013-06-10 DIAGNOSIS — M25519 Pain in unspecified shoulder: Secondary | ICD-10-CM | POA: Diagnosis not present

## 2013-06-10 DIAGNOSIS — M810 Age-related osteoporosis without current pathological fracture: Secondary | ICD-10-CM | POA: Diagnosis not present

## 2013-06-10 DIAGNOSIS — M19019 Primary osteoarthritis, unspecified shoulder: Secondary | ICD-10-CM | POA: Diagnosis not present

## 2013-06-10 DIAGNOSIS — I10 Essential (primary) hypertension: Secondary | ICD-10-CM | POA: Diagnosis not present

## 2013-06-10 DIAGNOSIS — Z5189 Encounter for other specified aftercare: Secondary | ICD-10-CM | POA: Diagnosis not present

## 2013-06-10 DIAGNOSIS — M25819 Other specified joint disorders, unspecified shoulder: Secondary | ICD-10-CM | POA: Diagnosis not present

## 2013-06-20 DIAGNOSIS — M19019 Primary osteoarthritis, unspecified shoulder: Secondary | ICD-10-CM | POA: Diagnosis not present

## 2013-06-23 ENCOUNTER — Encounter: Payer: Self-pay | Admitting: Physical Medicine & Rehabilitation

## 2013-06-23 ENCOUNTER — Ambulatory Visit (HOSPITAL_BASED_OUTPATIENT_CLINIC_OR_DEPARTMENT_OTHER): Payer: Medicare Other | Admitting: Physical Medicine & Rehabilitation

## 2013-06-23 ENCOUNTER — Encounter: Payer: Medicare Other | Attending: Physical Medicine & Rehabilitation

## 2013-06-23 VITALS — BP 161/84 | HR 74 | Resp 14 | Ht 59.0 in | Wt 123.0 lb

## 2013-06-23 DIAGNOSIS — G8929 Other chronic pain: Secondary | ICD-10-CM | POA: Insufficient documentation

## 2013-06-23 DIAGNOSIS — G8110 Spastic hemiplegia affecting unspecified side: Secondary | ICD-10-CM | POA: Insufficient documentation

## 2013-06-23 DIAGNOSIS — M545 Low back pain, unspecified: Secondary | ICD-10-CM | POA: Diagnosis not present

## 2013-06-23 DIAGNOSIS — I6350 Cerebral infarction due to unspecified occlusion or stenosis of unspecified cerebral artery: Secondary | ICD-10-CM

## 2013-06-23 DIAGNOSIS — M810 Age-related osteoporosis without current pathological fracture: Secondary | ICD-10-CM | POA: Diagnosis not present

## 2013-06-23 DIAGNOSIS — I10 Essential (primary) hypertension: Secondary | ICD-10-CM | POA: Insufficient documentation

## 2013-06-23 NOTE — Patient Instructions (Signed)
Physical therapy will call you for an appointment

## 2013-06-23 NOTE — Progress Notes (Signed)
Subjective:    Patient ID: Kathy Calhoun, female    DOB: [birth year], 80 y.o.   MRN: [id number]  HPI CIR 05/14/2012-05/21/2012 Kathy Calhoun is a 80 year old female  with history of hypertension, chronic low back pain, as well as right  rotator cuff problems who was admitted on May 12, 2013, with 4-5 day  history of right-sided weakness, right facial numbness, as well as  difficulty walking. MRI showed acute infarct in the left posterior  frontal lobe and precentral gyrus. MRA of head showed no stenosis. 2D  echo showed EF of 55% with grade 1 diastolic dysfunction. Carotid  Dopplers without significant ICA stenosis.   HHPT for about 2 wks, HHOT came out ~3 wks Referred to Orthopedics by PCP , hx of bone spurs removed and rotator cuff surgery Received 2 shots from ortho which helped a little, PT at ortho office  Traveling to [facility], we discussed the higher altitiude Also discussed possible elective surgery for shoulder pain, waiting is advised  Pain Inventory Average Pain 5 Pain Right Now 5 My pain is dull  In the last 24 hours, has pain interfered with the following? General activity 5 Relation with others 0 Enjoyment of life 0 What TIME of day is your pain at its worst? morning Sleep (in general) Fair  Pain is worse with: inactivity Pain improves with: medication Relief from Meds: 5  Mobility walk with assistance use a cane ability to climb steps?  yes do you drive?  no  Function retired  Neuro/Psych weakness  Prior Studies Any changes since last visit?  no  Physicians involved in your care Any changes since last visit?  no   Family History  Problem Relation Age of Onset  . Cancer Father   . Dementia Brother   . Dementia Sister    History   Social History  . Marital Status: Widowed    Spouse Name: N/A    Number of Children: N/A  . Years of Education: N/A   Social History Main Topics  . Smoking status: Never Smoker   . Smokeless tobacco: None    . Alcohol Use: No  . Drug Use: No  . Sexual Activity: None   Other Topics Concern  . None   Social History Narrative  . None   Past Surgical History  Procedure Laterality Date  . Cataract extraction      bilaterally 2011  . Appendectomy    . Tubal ligation    . Hysterotomy    . Oophorectomy    . Laparotomy      large benign mass removed 1993  . Nasal septum surgery     Past Medical History  Diagnosis Date  . Osteoporosis   . HTN (hypertension)   . Disc     bulging lumbar disc has had steriod injections  x3   BP 161/84  Pulse 74  Resp 14  Ht 4\' 11"  (1.499 m)  Wt 123 lb (55.792 kg)  BMI 24.83 kg/m2  SpO2 99%  Opioid Risk Score:   Fall Risk Score: Moderate Fall Risk (6-13 points) (pt educated and given brochure on fall risk)   Review of Systems  Constitutional: Positive for diaphoresis.  Neurological: Positive for weakness.  All other systems reviewed and are negative.      Objective:   Physical Exam  Nursing note and vitals reviewed. Constitutional: She appears well-developed and well-nourished.  Musculoskeletal:       Right shoulder: She exhibits decreased range of motion.  Neurological: She is alert. No sensory deficit. Coordination and gait abnormal.  Dysarthria  Psychiatric: She has a normal mood and affect.    Right shoulder abduction to 45 4/5 strength in the right biceps triceps and finger flexors 5/5 in the left biceps triceps and finger flexors as well as deltoid 5/5 in bilateral hip flexors knee extensors ankle dorsiflexor plantar flexor Ambulates without evidence of toe drag or knee instability but has loss of balance with turns      Assessment & Plan:  1. Left posterior frontal infarct with right upper extremity weakness as well as gait disorder Referral to outpatient PT and OT. Still has dysarthria but patient declines speech therapy  2. Right shoulder contracture chronic postoperative after a rotator cuff surgery

## 2013-06-30 DIAGNOSIS — R079 Chest pain, unspecified: Secondary | ICD-10-CM | POA: Diagnosis not present

## 2013-06-30 DIAGNOSIS — Z79899 Other long term (current) drug therapy: Secondary | ICD-10-CM | POA: Diagnosis not present

## 2013-06-30 DIAGNOSIS — Z23 Encounter for immunization: Secondary | ICD-10-CM | POA: Diagnosis not present

## 2013-06-30 DIAGNOSIS — I10 Essential (primary) hypertension: Secondary | ICD-10-CM | POA: Diagnosis not present

## 2013-06-30 DIAGNOSIS — I6990 Unspecified sequelae of unspecified cerebrovascular disease: Secondary | ICD-10-CM | POA: Diagnosis not present

## 2013-07-30 ENCOUNTER — Ambulatory Visit: Payer: Medicare Other | Admitting: Occupational Therapy

## 2013-07-30 ENCOUNTER — Ambulatory Visit: Payer: Medicare Other | Attending: Physical Medicine & Rehabilitation | Admitting: Physical Therapy

## 2013-07-30 DIAGNOSIS — M6281 Muscle weakness (generalized): Secondary | ICD-10-CM | POA: Insufficient documentation

## 2013-07-30 DIAGNOSIS — Z5189 Encounter for other specified aftercare: Secondary | ICD-10-CM | POA: Diagnosis not present

## 2013-07-30 DIAGNOSIS — R279 Unspecified lack of coordination: Secondary | ICD-10-CM | POA: Diagnosis not present

## 2013-07-30 DIAGNOSIS — R269 Unspecified abnormalities of gait and mobility: Secondary | ICD-10-CM | POA: Insufficient documentation

## 2013-07-31 ENCOUNTER — Ambulatory Visit (HOSPITAL_BASED_OUTPATIENT_CLINIC_OR_DEPARTMENT_OTHER): Payer: Medicare Other | Admitting: Physical Medicine & Rehabilitation

## 2013-07-31 ENCOUNTER — Encounter: Payer: Medicare Other | Attending: Physical Medicine & Rehabilitation

## 2013-07-31 ENCOUNTER — Encounter: Payer: Self-pay | Admitting: Physical Medicine & Rehabilitation

## 2013-07-31 VITALS — BP 137/75 | HR 86 | Resp 14 | Wt 125.2 lb

## 2013-07-31 DIAGNOSIS — I10 Essential (primary) hypertension: Secondary | ICD-10-CM | POA: Insufficient documentation

## 2013-07-31 DIAGNOSIS — M545 Low back pain, unspecified: Secondary | ICD-10-CM | POA: Insufficient documentation

## 2013-07-31 DIAGNOSIS — I6350 Cerebral infarction due to unspecified occlusion or stenosis of unspecified cerebral artery: Secondary | ICD-10-CM

## 2013-07-31 DIAGNOSIS — M810 Age-related osteoporosis without current pathological fracture: Secondary | ICD-10-CM | POA: Diagnosis not present

## 2013-07-31 DIAGNOSIS — G8929 Other chronic pain: Secondary | ICD-10-CM | POA: Diagnosis not present

## 2013-07-31 DIAGNOSIS — G8110 Spastic hemiplegia affecting unspecified side: Secondary | ICD-10-CM | POA: Insufficient documentation

## 2013-07-31 DIAGNOSIS — I69993 Ataxia following unspecified cerebrovascular disease: Secondary | ICD-10-CM

## 2013-07-31 NOTE — Progress Notes (Signed)
Subjective:    Patient ID: Kathy Calhoun, female    DOB: 01/22/1934, 80 y.o.   MRN: [id number]  HPI CIR 05/14/2013-05/21/2013  [name] is a 80 year old female  with history of hypertension, chronic low back pain, as well as right  rotator cuff problems who was admitted on May 12, 2013, with 4-5 day  history of right-sided weakness, right facial numbness, as well as  difficulty walking. MRI showed acute infarct in the left posterior  frontal lobe and precentral gyrus. MRA of head showed no stenosis. 2D  echo showed EF of 55% with grade 1 diastolic dysfunction. Carotid  Dopplers without significant ICA stenosis.   Pain Inventory Average Pain 5 Pain Right Now 5 My pain is dull  In the last 24 hours, has pain interfered with the following? General activity 0 Relation with others 3 Enjoyment of life 0 What TIME of day is your pain at its worst? morning Sleep (in general) Fair  Pain is worse with: inactivity and some activites Pain improves with: pacing activities Relief from Meds: 5  Mobility walk without assistance use a cane how many minutes can you walk? 20 ability to climb steps?  yes do you drive?  no  Function retired Do you have any goals in this area?  yes  independence  Neuro/Psych weakness numbness dizziness anxiety  Prior Studies Any changes since last visit?  no  Physicians involved in your care Any changes since last visit?  no   Family History  Problem Relation Age of Onset  . Cancer Father   . Dementia Brother   . Dementia Sister    History   Social History  . Marital Status: Widowed    Spouse Name: N/A    Number of Children: N/A  . Years of Education: N/A   Social History Main Topics  . Smoking status: Never Smoker   . Smokeless tobacco: None  . Alcohol Use: No  . Drug Use: No  . Sexual Activity: None   Other Topics Concern  . None   Social History Narrative  . None   Past Surgical History  Procedure Laterality Date   . Cataract extraction      bilaterally 2011  . Appendectomy    . Tubal ligation    . Hysterotomy    . Oophorectomy    . Laparotomy      large benign mass removed 1993  . Nasal septum surgery     Past Medical History  Diagnosis Date  . Osteoporosis   . HTN (hypertension)   . Disc     bulging lumbar disc has had steriod injections  x3   BP 137/75  Pulse 86  Resp 14  Wt 125 lb 3.2 oz (56.79 kg)  SpO2 97%  Opioid Risk Score:   Fall Risk Score: Moderate Fall Risk (6-13 points) (educated and handout for fall prevention in the home at previous visit)  Review of Systems  Constitutional: Positive for diaphoresis.  Respiratory: Positive for shortness of breath.   Neurological: Positive for dizziness, weakness and numbness.  Psychiatric/Behavioral: The patient is nervous/anxious.   All other systems reviewed and are negative.      Objective:   Physical Exam Nursing note and vitals reviewed.  Constitutional: She appears well-developed and well-nourished.  Musculoskeletal:  Right shoulder: She exhibits decreased range of motion.  Neurological: She is alert. No sensory deficit. Coordination and gait abnormal.  Dysarthria  Psychiatric: She has a normal mood and affect.  Right shoulder abduction to 45  5/5 strength in the right biceps 4/5 triceps and  77finger flexors  5/5 in the left biceps triceps and 4/5 finger flexors as well as deltoid  5/5 in bilateral hip flexors knee extensors ankle dorsiflexor plantar flexor  Ambulates without evidence of toe drag or knee instability but has loss of balance with turns  Decreased fine motor in R hand       Assessment & Plan:  1. Left posterior frontal infarct with right upper extremity weakness as well as gait disorder outpt OT and PT Overall this patient is doing quite well. I do not anticipate that she'll be needing physical therapy for very long.  Her daughter from [place] is visiting. We discussed her progress as well as a  residual deficits. I would anticipate she should be ready for driving next visit will recheck next month

## 2013-07-31 NOTE — Patient Instructions (Signed)
No driving until next visit  Outpatient therapy

## 2013-08-06 DIAGNOSIS — I6990 Unspecified sequelae of unspecified cerebrovascular disease: Secondary | ICD-10-CM | POA: Diagnosis not present

## 2013-08-06 DIAGNOSIS — R05 Cough: Secondary | ICD-10-CM | POA: Diagnosis not present

## 2013-08-06 DIAGNOSIS — Z79899 Other long term (current) drug therapy: Secondary | ICD-10-CM | POA: Diagnosis not present

## 2013-08-06 DIAGNOSIS — I10 Essential (primary) hypertension: Secondary | ICD-10-CM | POA: Diagnosis not present

## 2013-08-06 DIAGNOSIS — R059 Cough, unspecified: Secondary | ICD-10-CM | POA: Diagnosis not present

## 2013-08-06 DIAGNOSIS — E7800 Pure hypercholesterolemia, unspecified: Secondary | ICD-10-CM | POA: Diagnosis not present

## 2013-08-11 ENCOUNTER — Encounter: Payer: Self-pay | Admitting: Interventional Cardiology

## 2013-08-11 ENCOUNTER — Ambulatory Visit (INDEPENDENT_AMBULATORY_CARE_PROVIDER_SITE_OTHER): Payer: Medicare Other | Admitting: Interventional Cardiology

## 2013-08-11 VITALS — BP 115/70 | HR 72 | Wt 123.0 lb

## 2013-08-11 DIAGNOSIS — R079 Chest pain, unspecified: Secondary | ICD-10-CM

## 2013-08-11 DIAGNOSIS — E785 Hyperlipidemia, unspecified: Secondary | ICD-10-CM

## 2013-08-11 DIAGNOSIS — I6350 Cerebral infarction due to unspecified occlusion or stenosis of unspecified cerebral artery: Secondary | ICD-10-CM

## 2013-08-11 DIAGNOSIS — I10 Essential (primary) hypertension: Secondary | ICD-10-CM

## 2013-08-11 NOTE — Progress Notes (Signed)
Patient ID: Kathy Calhoun, female   DOB: 10/16/1933, 80 y.o.   MRN: [id number]     Patient ID: Kathy Calhoun MRN: [id number] DOB/AGE: 01-Jul-1933 80 y.o.   Referring Physician Dr. Felipa Eth   Reason for Consultation: chest discomfort  HPI: 80 y/o woman. She had a stroke Jan 2015. She Had 2 episodes of severe, sharp, localized mid sternal chest pain in April. lasted 2-56min, 10/10 on 10 point scale. Pain happened while sitting on the couch. Couldn't move, couldn't think. Slight SHOB with exertion occasionally but no chest pain. No dizziness, syncope, orthopnea, PND, LE edema, stroke-like symptoms. No bleeding. Still doing rehab for the stroke. That is most strenuous activity.  FHx: Mother: CHF  Current Outpatient Prescriptions  Medication Sig Dispense Refill  . amLODipine (NORVASC) 2.5 MG tablet Take 5 mg by mouth daily.      Marland Kitchen aspirin 325 MG tablet Take 1 tablet (325 mg total) by mouth daily.      Marland Kitchen atorvastatin (LIPITOR) 40 MG tablet Take 40 mg by mouth daily.      . Calcium-Vitamin D (CALTRATE 600 PLUS-VIT D PO) Take 1 tablet by mouth daily.       . cholecalciferol (VITAMIN D) 1000 UNITS tablet Take 2,000 Units by mouth daily.      . diclofenac sodium (VOLTAREN) 1 % GEL Apply 2 g topically 3 (three) times daily before meals. To right shoulder  3 Tube  1  . DULoxetine (CYMBALTA) 30 MG capsule Take 1 capsule (30 mg total) by mouth daily.  30 capsule  1  . Multiple Vitamins-Minerals (MULTIVITAMIN WITH MINERALS) tablet Take 1 tablet by mouth daily.       No current facility-administered medications for this visit.   Past Medical History  Diagnosis Date  . Osteoporosis   . HTN (hypertension)   . Disc     bulging lumbar disc has had steriod injections  x3    Family History  Problem Relation Age of Onset  . Cancer Father   . Dementia Brother   . Dementia Sister     History   Social History  . Marital Status: Widowed    Spouse Name: N/A    Number of Children: N/A  . Years of  Education: N/A   Occupational History  . Not on file.   Social History Main Topics  . Smoking status: Never Smoker   . Smokeless tobacco: Not on file  . Alcohol Use: No  . Drug Use: No  . Sexual Activity: Not on file   Other Topics Concern  . Not on file   Social History Narrative  . No narrative on file    Past Surgical History  Procedure Laterality Date  . Cataract extraction      bilaterally 2011  . Appendectomy    . Tubal ligation    . Hysterotomy    . Oophorectomy    . Laparotomy      large benign mass removed 1993  . Nasal septum surgery        (Not in a hospital admission)  Review of systems complete and found to be negative unless listed above .  No nausea, vomiting.  No fever chills, No focal weakness,  No palpitations.  Physical Exam: Filed Vitals:   08/11/13 1101  BP: 115/70  Pulse: 72    Weight: 123 lb (55.792 kg)  Physical exam:  [place]/AT EOMI No JVD, No carotid bruit RRR S1S2  No wheezing Soft. NT, nondistended No edema. No  focal motor or sensory deficits Normal affect  Labs:   Lab Results  Component Value Date   WBC 6.4 05/15/2013   HGB 13.4 05/15/2013   HCT 39.1 05/15/2013   MCV 92.0 05/15/2013   PLT 287 05/15/2013   No results found for this basename: NA, K, CL, CO2, BUN, CREATININE, CALCIUM, LABALBU, PROT, BILITOT, ALKPHOS, ALT, AST, GLUCOSE,  in the last 168 hours Lab Results  Component Value Date   TROPONINI <0.30 05/12/2013    Lab Results  Component Value Date   CHOL 259* 05/13/2013   Lab Results  Component Value Date   HDL 75 05/13/2013   Lab Results  Component Value Date   LDLCALC 148* 05/13/2013   Lab Results  Component Value Date   TRIG 178* 05/13/2013   Lab Results  Component Value Date   CHOLHDL 3.5 05/13/2013   No results found for this basename: LDLDIRECT      Radiology: EKG: Normal ECG in March 2015  ASSESSMENT AND PLAN: Atypical chest pain  1.  Plan to evaluate with a nuclear stress test. She would be unable to walk  on the treadmill.  Evaluate for ischemia. We discussed cardiac catheterization but she prefers the less invasive test. I think is reasonable given the character of her discomfort.  2.  hypertension: Blood pressure control. Continue current medicines.  3. hyperlipidemia: Continue atorvastatin. Signed:   Mina Marble, MD, [facility] 08/11/2013, 11:18 AM

## 2013-08-11 NOTE — Patient Instructions (Signed)
Your physician has requested that you have a lexiscan myoview. For further information please visit [email or web address]. Please follow instruction sheet, as given.   

## 2013-08-12 ENCOUNTER — Ambulatory Visit: Payer: Medicare Other | Admitting: Occupational Therapy

## 2013-08-12 ENCOUNTER — Ambulatory Visit: Payer: Medicare Other | Attending: Physical Medicine & Rehabilitation | Admitting: Physical Therapy

## 2013-08-12 DIAGNOSIS — Z5189 Encounter for other specified aftercare: Secondary | ICD-10-CM | POA: Insufficient documentation

## 2013-08-12 DIAGNOSIS — R279 Unspecified lack of coordination: Secondary | ICD-10-CM | POA: Diagnosis not present

## 2013-08-12 DIAGNOSIS — M6281 Muscle weakness (generalized): Secondary | ICD-10-CM | POA: Insufficient documentation

## 2013-08-12 DIAGNOSIS — R269 Unspecified abnormalities of gait and mobility: Secondary | ICD-10-CM | POA: Diagnosis not present

## 2013-08-14 ENCOUNTER — Ambulatory Visit: Payer: Medicare Other | Admitting: Occupational Therapy

## 2013-08-14 ENCOUNTER — Ambulatory Visit: Payer: Medicare Other | Admitting: Physical Therapy

## 2013-08-14 DIAGNOSIS — Z5189 Encounter for other specified aftercare: Secondary | ICD-10-CM | POA: Diagnosis not present

## 2013-08-15 ENCOUNTER — Ambulatory Visit: Payer: Medicare Other | Admitting: Interventional Cardiology

## 2013-08-19 ENCOUNTER — Telehealth: Payer: Self-pay

## 2013-08-19 ENCOUNTER — Ambulatory Visit: Payer: Medicare Other | Admitting: Occupational Therapy

## 2013-08-19 ENCOUNTER — Ambulatory Visit: Payer: Medicare Other | Admitting: Physical Therapy

## 2013-08-19 DIAGNOSIS — Z5189 Encounter for other specified aftercare: Secondary | ICD-10-CM | POA: Diagnosis not present

## 2013-08-19 MED ORDER — DULOXETINE HCL 30 MG PO CPEP: 30.0000 mg | ORAL_CAPSULE | Freq: Every day | ORAL | Status: DC

## 2013-08-19 NOTE — Telephone Encounter (Signed)
Pharmacy sent a request to refill patient's Duloxentine. Is this okay?

## 2013-08-19 NOTE — Telephone Encounter (Signed)
Duloxentine refills sent to [facility]. Refill okayed by Dr. Letta Pate.

## 2013-08-19 NOTE — Telephone Encounter (Signed)
Ok to RF duloxetine

## 2013-08-20 ENCOUNTER — Ambulatory Visit (HOSPITAL_COMMUNITY): Payer: Medicare Other | Attending: Cardiology | Admitting: Radiology

## 2013-08-20 VITALS — BP 145/68 | Ht 59.0 in | Wt 122.0 lb

## 2013-08-20 DIAGNOSIS — R079 Chest pain, unspecified: Secondary | ICD-10-CM | POA: Diagnosis not present

## 2013-08-20 DIAGNOSIS — R0609 Other forms of dyspnea: Secondary | ICD-10-CM | POA: Diagnosis not present

## 2013-08-20 DIAGNOSIS — I10 Essential (primary) hypertension: Secondary | ICD-10-CM | POA: Diagnosis not present

## 2013-08-20 DIAGNOSIS — Z8673 Personal history of transient ischemic attack (TIA), and cerebral infarction without residual deficits: Secondary | ICD-10-CM | POA: Diagnosis not present

## 2013-08-20 DIAGNOSIS — I779 Disorder of arteries and arterioles, unspecified: Secondary | ICD-10-CM | POA: Diagnosis not present

## 2013-08-20 DIAGNOSIS — R002 Palpitations: Secondary | ICD-10-CM | POA: Insufficient documentation

## 2013-08-20 DIAGNOSIS — R0989 Other specified symptoms and signs involving the circulatory and respiratory systems: Secondary | ICD-10-CM | POA: Insufficient documentation

## 2013-08-20 MED ORDER — TECHNETIUM TC 99M SESTAMIBI GENERIC - CARDIOLITE
11.0000 | Freq: Once | INTRAVENOUS | Status: AC | PRN
  Administered 2013-08-20: 11 via INTRAVENOUS

## 2013-08-20 MED ORDER — REGADENOSON 0.4 MG/5ML IV SOLN
0.4000 mg | Freq: Once | INTRAVENOUS | Status: AC
  Administered 2013-08-20: 0.4 mg via INTRAVENOUS

## 2013-08-20 MED ORDER — TECHNETIUM TC 99M SESTAMIBI GENERIC - CARDIOLITE
33.0000 | Freq: Once | INTRAVENOUS | Status: AC | PRN
  Administered 2013-08-20: 33 via INTRAVENOUS

## 2013-08-20 NOTE — Progress Notes (Signed)
[facility] [address] [place], [place] [address] [phone]    Cardiology Nuclear Med Study  Kathy Calhoun is a 80 y.o. female     MRN : [id number]     DOB: 1933/09/13  Procedure Date: 08/20/2013  Nuclear Med Background Indication for Stress Test:  Evaluation for Ischemia and [place] 3/15 CVA, Possible Shoulder surgery- Dr. Tania Ade History:  No H/O of CAD 05/13/13 ECHO: EF: 55% Cardiac Risk Factors: Carotid Disease, CVA, Hypertension and Lipids  Symptoms:  Chest Pain, DOE and Palpitations   Nuclear Pre-Procedure Caffeine/Decaff Intake:  None NPO After: 8:00pm   Lungs:  clear O2 Sat: 97% on room air. IV 0.9% NS with Angio Cath:  24g  IV Site: R Hand  IV Started by:  Crissie Figures, RN  Chest Size (in):  34 Cup Size: B  Height: 4\' 11"  (1.499 m)  Weight:  122 lb (55.339 kg)  BMI:  Body mass index is 24.63 kg/(m^2). Tech Comments:  N/A    Nuclear Med Study 1 or 2 day study: 1 day  Stress Test Type:  Lexiscan  Reading MD: N/A  Order Authorizing Provider:  Thamas Jaegers, MD  Resting Radionuclide: Technetium 12m Sestamibi  Resting Radionuclide Dose: 11.0 mCi   Stress Radionuclide:  Technetium 34m Sestamibi  Stress Radionuclide Dose: 33.0 mCi           Stress Protocol Rest HR: 67 Stress HR: 103  Rest BP: 145/68 Stress BP: 154/79  Exercise Time (min): n/a METS: n/a   Predicted Max HR: 142 bpm % Max HR: 72.54 bpm Rate Pressure Product: 15862   Dose of Adenosine (mg):  n/a Dose of Lexiscan: 0.4 mg  Dose of Atropine (mg): n/a Dose of Dobutamine: n/a mcg/kg/min (at max HR)  Stress Test Technologist: Perrin Maltese, EMT-P  Nuclear Technologist:  Charlton Amor, CNMT     Rest Procedure:  Myocardial perfusion imaging was performed at rest 45 minutes following the intravenous administration of Technetium 28m Sestamibi. Rest ECG: NSR - Normal EKG  Stress Procedure:  The patient received IV Lexiscan 0.4 mg over 15-seconds.   Technetium 18m Sestamibi injected at 30-seconds. This patient had chest pressure and lt. Headed with the Lexiscan injection. Quantitative spect images were obtained after a 45 minute delay. Stress ECG: No significant change from baseline ECG  QPS Raw Data Images:  Normal; no motion artifact; normal heart/lung ratio. Stress Images:  Normal homogeneous uptake in all areas of the myocardium. Rest Images:  Normal homogeneous uptake in all areas of the myocardium. Subtraction (SDS):  Normal Transient Ischemic Dilatation (Normal <1.22):  0.82 Lung/Heart Ratio (Normal <0.45):  0.23  Quantitative Gated Spect Images QGS EDV:  55 ml QGS ESV:  20 ml  Impression Exercise Capacity:  Lexiscan with no exercise. BP Response:  Normal blood pressure response. Clinical Symptoms:  Lightheaded ECG Impression:  No significant ST segment change suggestive of ischemia. Comparison with Prior Nuclear Study: No images to compare  Overall Impression:  Normal stress nuclear study.  LV Ejection Fraction: 64%.  LV Wall Motion:  NL LV Function; NL Wall Motion    [name]

## 2013-08-21 ENCOUNTER — Encounter: Payer: Medicare Other | Admitting: Occupational Therapy

## 2013-08-21 ENCOUNTER — Ambulatory Visit: Payer: Medicare Other | Admitting: Physical Therapy

## 2013-08-26 ENCOUNTER — Ambulatory Visit: Payer: Medicare Other | Admitting: Physical Therapy

## 2013-08-26 ENCOUNTER — Ambulatory Visit: Payer: Medicare Other | Admitting: Occupational Therapy

## 2013-08-26 DIAGNOSIS — Z5189 Encounter for other specified aftercare: Secondary | ICD-10-CM | POA: Diagnosis not present

## 2013-08-28 ENCOUNTER — Ambulatory Visit: Payer: Medicare Other | Admitting: Physical Therapy

## 2013-08-28 ENCOUNTER — Ambulatory Visit: Payer: Medicare Other | Admitting: Occupational Therapy

## 2013-09-02 ENCOUNTER — Ambulatory Visit: Payer: Medicare Other | Admitting: Occupational Therapy

## 2013-09-02 ENCOUNTER — Ambulatory Visit: Payer: Medicare Other | Admitting: Physical Therapy

## 2013-09-02 DIAGNOSIS — Z5189 Encounter for other specified aftercare: Secondary | ICD-10-CM | POA: Diagnosis not present

## 2013-09-04 ENCOUNTER — Ambulatory Visit: Payer: Medicare Other | Admitting: Physical Therapy

## 2013-09-04 ENCOUNTER — Ambulatory Visit: Payer: Medicare Other | Admitting: Occupational Therapy

## 2013-09-04 DIAGNOSIS — Z5189 Encounter for other specified aftercare: Secondary | ICD-10-CM | POA: Diagnosis not present

## 2013-09-05 ENCOUNTER — Encounter: Payer: Medicare Other | Attending: Physical Medicine & Rehabilitation

## 2013-09-05 ENCOUNTER — Ambulatory Visit: Payer: Medicare Other | Admitting: Physical Medicine & Rehabilitation

## 2013-09-05 DIAGNOSIS — G8929 Other chronic pain: Secondary | ICD-10-CM | POA: Insufficient documentation

## 2013-09-05 DIAGNOSIS — M810 Age-related osteoporosis without current pathological fracture: Secondary | ICD-10-CM | POA: Insufficient documentation

## 2013-09-05 DIAGNOSIS — G8110 Spastic hemiplegia affecting unspecified side: Secondary | ICD-10-CM | POA: Insufficient documentation

## 2013-09-05 DIAGNOSIS — I10 Essential (primary) hypertension: Secondary | ICD-10-CM | POA: Insufficient documentation

## 2013-09-05 DIAGNOSIS — M545 Low back pain, unspecified: Secondary | ICD-10-CM | POA: Insufficient documentation

## 2013-09-09 ENCOUNTER — Ambulatory Visit: Payer: Medicare Other | Admitting: Speech Pathology

## 2013-09-09 DIAGNOSIS — Z79899 Other long term (current) drug therapy: Secondary | ICD-10-CM | POA: Diagnosis not present

## 2013-09-09 DIAGNOSIS — E7800 Pure hypercholesterolemia, unspecified: Secondary | ICD-10-CM | POA: Diagnosis not present

## 2013-09-09 DIAGNOSIS — Z5189 Encounter for other specified aftercare: Secondary | ICD-10-CM | POA: Diagnosis not present

## 2013-09-11 ENCOUNTER — Ambulatory Visit: Payer: Medicare Other | Admitting: Speech Pathology

## 2013-09-16 ENCOUNTER — Encounter: Payer: Medicare Other | Admitting: Speech Pathology

## 2013-09-18 ENCOUNTER — Encounter: Payer: Medicare Other | Admitting: Speech Pathology

## 2013-09-23 ENCOUNTER — Encounter: Payer: Medicare Other | Admitting: Speech Pathology

## 2013-09-25 ENCOUNTER — Encounter: Payer: Medicare Other | Admitting: Speech Pathology

## 2013-09-30 ENCOUNTER — Encounter: Payer: Medicare Other | Admitting: Speech Pathology

## 2013-10-02 ENCOUNTER — Encounter: Payer: Medicare Other | Admitting: Speech Pathology

## 2013-10-07 ENCOUNTER — Encounter: Payer: Medicare Other | Admitting: Speech Pathology

## 2013-10-09 ENCOUNTER — Encounter: Payer: Medicare Other | Admitting: Speech Pathology

## 2013-11-07 ENCOUNTER — Ambulatory Visit (HOSPITAL_BASED_OUTPATIENT_CLINIC_OR_DEPARTMENT_OTHER): Payer: Medicare Other | Admitting: Physical Medicine & Rehabilitation

## 2013-11-07 ENCOUNTER — Encounter: Payer: Self-pay | Admitting: Physical Medicine & Rehabilitation

## 2013-11-07 ENCOUNTER — Ambulatory Visit: Payer: Medicare Other | Admitting: Physical Medicine & Rehabilitation

## 2013-11-07 ENCOUNTER — Encounter: Payer: Medicare Other | Attending: Physical Medicine & Rehabilitation

## 2013-11-07 VITALS — BP 142/54 | HR 89 | Resp 14 | Wt 127.0 lb

## 2013-11-07 DIAGNOSIS — G8110 Spastic hemiplegia affecting unspecified side: Secondary | ICD-10-CM | POA: Diagnosis not present

## 2013-11-07 DIAGNOSIS — I6350 Cerebral infarction due to unspecified occlusion or stenosis of unspecified cerebral artery: Secondary | ICD-10-CM

## 2013-11-07 DIAGNOSIS — G8929 Other chronic pain: Secondary | ICD-10-CM | POA: Diagnosis not present

## 2013-11-07 DIAGNOSIS — M810 Age-related osteoporosis without current pathological fracture: Secondary | ICD-10-CM | POA: Diagnosis not present

## 2013-11-07 DIAGNOSIS — I10 Essential (primary) hypertension: Secondary | ICD-10-CM | POA: Diagnosis not present

## 2013-11-07 DIAGNOSIS — M545 Low back pain, unspecified: Secondary | ICD-10-CM | POA: Insufficient documentation

## 2013-11-07 DIAGNOSIS — I69993 Ataxia following unspecified cerebrovascular disease: Secondary | ICD-10-CM

## 2013-11-07 NOTE — Patient Instructions (Signed)
Recommend neurology followup to see if aspirin can be stopped prior to surgery  May resume driving

## 2013-11-07 NOTE — Progress Notes (Signed)
Subjective:    Patient ID: Kathy Calhoun, female    DOB: [birth year], 80 y.o.   MRN: [id number] CIR 05/14/2013-05/21/2013  [name] is a 80 year old female  with history of hypertension, chronic low back pain, as well as right  rotator cuff problems who was admitted on May 12, 2013, with 4-5 day  history of right-sided weakness, right facial numbness, as well as  difficulty walking. MRI showed acute infarct in the left posterior  frontal lobe and precentral gyrus. MRA of head showed no stenosis. 2D  echo showed EF of 55% with grade 1 diastolic dysfunction. Carotid  Dopplers without significant ICA stenosis.   HPI Had a cold No longer getting PT/OT  R shoulder pain chronic, had rotator cuff repair Only gets about 2 weeks of relief with injection of shoulder Considering surgery will followup with orthopedic Pain Inventory Average Pain 5 Pain Right Now 5 My pain is aching  In the last 24 hours, has pain interfered with the following? General activity 2 Relation with others 2 Enjoyment of life 2 What TIME of day is your pain at its worst? morning Sleep (in general) Fair  Pain is worse with: inactivity Pain improves with: pacing activities Relief from Meds: 4  Mobility walk without assistance ability to climb steps?  yes do you drive?  no  Function retired I need assistance with the following:  household duties  Neuro/Psych spasms  Prior Studies Any changes since last visit?  no  Physicians involved in your care Any changes since last visit?  no   Family History  Problem Relation Age of Onset  . Cancer Father   . Dementia Brother   . Dementia Sister    History   Social History  . Marital Status: Widowed    Spouse Name: N/A    Number of Children: N/A  . Years of Education: N/A   Social History Main Topics  . Smoking status: Never Smoker   . Smokeless tobacco: None  . Alcohol Use: No  . Drug Use: No  . Sexual Activity: None   Other Topics  Concern  . None   Social History Narrative  . None   Past Surgical History  Procedure Laterality Date  . Cataract extraction      bilaterally 2011  . Appendectomy    . Tubal ligation    . Hysterotomy    . Oophorectomy    . Laparotomy      large benign mass removed 1993  . Nasal septum surgery     Past Medical History  Diagnosis Date  . Osteoporosis   . HTN (hypertension)   . Disc     bulging lumbar disc has had steriod injections  x3   BP 142/54  Pulse 89  Resp 14  Wt 127 lb (57.607 kg)  SpO2 99%  Opioid Risk Score:   Fall Risk Score: Moderate Fall Risk (6-13 points) (previously educated and given handout)  Review of Systems  Constitutional: Positive for diaphoresis.  Respiratory: Positive for cough.   All other systems reviewed and are negative.      Objective:   Physical Exam  Constitutional: She appears well-developed and well-nourished.  Musculoskeletal:  Right shoulder: She exhibits decreased range of motion.  Neurological: She is alert. No sensory deficit. Coordination and gait abnormal.  Dysarthria  Psychiatric: She has a normal mood and affect.  Right shoulder abduction to 45  5/5 strength in the right biceps 4/5 triceps and 4-/5 finger flexors  5/5 in the left biceps triceps and 5/5 finger flexors as well as deltoid  5/5 in bilateral hip flexors knee extensors ankle dorsiflexor plantar flexor  Ambulates without evidence of toe drag or knee instability but has loss of balance with turns  Decreased fine motor in R hand       Assessment & Plan:  1. Left posterior frontal infarct with right upper extremity weakness as well as gait disorder  outpt OT and PT   Recommend neurology followup to see if aspirin can be stopped prior to surgery  Return to clinic when necessary

## 2013-11-12 ENCOUNTER — Other Ambulatory Visit: Payer: Self-pay

## 2013-11-12 DIAGNOSIS — Z1231 Encounter for screening mammogram for malignant neoplasm of breast: Secondary | ICD-10-CM

## 2013-12-01 ENCOUNTER — Ambulatory Visit: Payer: Medicare Other

## 2013-12-10 ENCOUNTER — Ambulatory Visit (INDEPENDENT_AMBULATORY_CARE_PROVIDER_SITE_OTHER): Payer: Medicare Other | Admitting: Neurology

## 2013-12-10 ENCOUNTER — Encounter: Payer: Self-pay | Admitting: Neurology

## 2013-12-10 ENCOUNTER — Encounter (INDEPENDENT_AMBULATORY_CARE_PROVIDER_SITE_OTHER): Payer: Self-pay

## 2013-12-10 VITALS — BP 149/64 | HR 77 | Ht 59.0 in | Wt 130.2 lb

## 2013-12-10 DIAGNOSIS — I6789 Other cerebrovascular disease: Secondary | ICD-10-CM | POA: Diagnosis not present

## 2013-12-10 DIAGNOSIS — I6350 Cerebral infarction due to unspecified occlusion or stenosis of unspecified cerebral artery: Secondary | ICD-10-CM | POA: Diagnosis not present

## 2013-12-10 NOTE — Patient Instructions (Addendum)
I had a long discussion with the patient with regards to her stroke, discuss and personally reviewed  Imaging studies and stroke workup and answered questions. Continue aspirin for stroke prevention and strict control of hypertension with blood pressure goal below 130/90 and lipids with LDL cholesterol goal below 100 mg percent. Return for followup in 6 months with Charlott Holler, NP or call earlier if necessary.  Stroke Prevention Some medical conditions and behaviors are associated with an increased chance of having a stroke. You may prevent a stroke by making healthy choices and managing medical conditions. HOW CAN I REDUCE MY RISK OF HAVING A STROKE?   Stay physically active. Get at least 30 minutes of activity on most or all days.  Do not smoke. It may also be helpful to avoid exposure to secondhand smoke.  Limit alcohol use. Moderate alcohol use is considered to be:  No more than 2 drinks per day for men.  No more than 1 drink per day for nonpregnant women.  Eat healthy foods. This involves:  Eating 5 or more servings of fruits and vegetables a day.  Making dietary changes that address high blood pressure (hypertension), high cholesterol, diabetes, or obesity.  Manage your cholesterol levels.  Making food choices that are high in fiber and low in saturated fat, trans fat, and cholesterol may control cholesterol levels.  Take any prescribed medicines to control cholesterol as directed by your health care provider.  Manage your diabetes.  Controlling your carbohydrate and sugar intake is recommended to manage diabetes.  Take any prescribed medicines to control diabetes as directed by your health care provider.  Control your hypertension.  Making food choices that are low in salt (sodium), saturated fat, trans fat, and cholesterol is recommended to manage hypertension.  Take any prescribed medicines to control hypertension as directed by your health care provider.  Maintain a  healthy weight.  Reducing calorie intake and making food choices that are low in sodium, saturated fat, trans fat, and cholesterol are recommended to manage weight.  Stop drug abuse.  Avoid taking birth control pills.  Talk to your health care provider about the risks of taking birth control pills if you are over 36 years old, smoke, get migraines, or have ever had a blood clot.  Get evaluated for sleep disorders (sleep apnea).  Talk to your health care provider about getting a sleep evaluation if you snore a lot or have excessive sleepiness.  Take medicines only as directed by your health care provider.  For some people, aspirin or blood thinners (anticoagulants) are helpful in reducing the risk of forming abnormal blood clots that can lead to stroke. If you have the irregular heart rhythm of atrial fibrillation, you should be on a blood thinner unless there is a good reason you cannot take them.  Understand all your medicine instructions.  Make sure that other conditions (such as anemia or atherosclerosis) are addressed. SEEK IMMEDIATE MEDICAL CARE IF:   You have sudden weakness or numbness of the face, arm, or leg, especially on one side of the body.  Your face or eyelid droops to one side.  You have sudden confusion.  You have trouble speaking (aphasia) or understanding.  You have sudden trouble seeing in one or both eyes.  You have sudden trouble walking.  You have dizziness.  You have a loss of balance or coordination.  You have a sudden, severe headache with no known cause.  You have new chest pain or an irregular heartbeat.  Any of these symptoms may represent a serious problem that is an emergency. Do not wait to see if the symptoms will go away. Get medical help at once. Call your local emergency services (911 in [place] Do not drive yourself to the hospital. Document Released: 04/06/2004 Document Revised: 07/14/2013 Document Reviewed: 08/30/2012 [facility] Patient  Information 2015 [place], [place]. This information is not intended to replace advice given to you by your health care provider. Make sure you discuss any questions you have with your health care provider.

## 2013-12-10 NOTE — Progress Notes (Signed)
[facility] [address] [place]. [place] [address] [phone]       OFFICE FOLLOW-UP NOTE  [name] Date of Birth:  April 17, 1933 Medical Record Number:  [id number]   HPI: 12 year Caucasian lady is seen today for the first office followup visit for Hospital admission for stroke in March 2015. She was admitted on 05/12/13 with   five-day history of right facial numbness and hand weakness and difficulty walking. MRI scan shows an acute 14 mm left posterior frontal white matter and precentral gyrus infarct. MRA showed no large vessel stenosis. Transthoracic echo showed normal ejection fraction. Carotid ultrasound showed no significant obstructive stenosis. Patient's risk factors found included hypertension hyperlipidemia. She started on aspirin. She went to inpatient rehabilitation and showed steady improvement. She has had chronic right shoulder pain from rotator cuff injury . She is tolerating aspirin without bleeding or bruising. She has finished outpatient physical & occupational therapy. She still has a mild distal right hand weakness and diminished fine motor skills. She states her blood pressure is quite well controlled though it is slightly elevated at 149/74 office today. She had lipid profile checked in June 2015 by Dr. Felipa Eth and that was fine. She plans to have right shoulder surgery for rotator cuff and one teeth pulled out and has questions about stroke risk and stopping aspirin.  ROS:   14 system review of systems is positive for cough, snoring, bruising, joint pain, restless legs, shoulder pain and all other systems negative. PMH:  Past Medical History  Diagnosis Date  . Osteoporosis   . HTN (hypertension)   . Disc     bulging lumbar disc has had steriod injections  x3  . Stroke     Social History:  History   Social History  . Marital Status: Widowed    Spouse Name: N/A    Number of Children: 29  . Years of Education: college   Occupational  History  . CNA    Social History Main Topics  . Smoking status: Never Smoker   . Smokeless tobacco: Not on file  . Alcohol Use: No  . Drug Use: No  . Sexual Activity: No   Other Topics Concern  . Not on file   Social History Narrative   Patient lives at home alone   Patient right handed   Patient drinks coffee    Medications:   Current Outpatient Prescriptions on File Prior to Visit  Medication Sig Dispense Refill  . amLODipine (NORVASC) 2.5 MG tablet Take 5 mg by mouth daily.      Marland Kitchen aspirin 325 MG tablet Take 1 tablet (325 mg total) by mouth daily.      Marland Kitchen atorvastatin (LIPITOR) 40 MG tablet Take 40 mg by mouth daily.      . Calcium-Vitamin D (CALTRATE 600 PLUS-VIT D PO) Take 1 tablet by mouth daily.       . cholecalciferol (VITAMIN D) 1000 UNITS tablet Take 2,000 Units by mouth daily.      . diclofenac sodium (VOLTAREN) 1 % GEL Apply 2 g topically 3 (three) times daily before meals. To right shoulder  3 Tube  1  . DULoxetine (CYMBALTA) 30 MG capsule Take 1 capsule (30 mg total) by mouth daily.  30 capsule  3  . lisinopril (PRINIVIL,ZESTRIL) 5 MG tablet Take 5 mg by mouth daily.       . Multiple Vitamins-Minerals (MULTIVITAMIN WITH MINERALS) tablet Take 1 tablet by mouth daily.  No current facility-administered medications on file prior to visit.    Allergies:   Allergies  Allergen Reactions  . Codeine Rash  . Sulfa Antibiotics Rash    Physical Exam General: well developed, well nourished, seated, in no evident distress Head: head normocephalic and atraumatic.  Neck: supple with no carotid or supraclavicular bruits Cardiovascular: regular rate and rhythm, no murmurs Musculoskeletal: kyphoscoliosis. Rt shoulder elevation limited by pain Skin:  no rash/petichiae Vascular:  Normal pulses all extremities Filed Vitals:   12/10/13 1358  BP: 149/64  Pulse: 77   Neurologic Exam Mental Status: Awake and fully alert. Oriented to place and time. Recent and remote  memory intact. Attention span, concentration and fund of knowledge appropriate. Mood and affect appropriate.  Cranial Nerves: Fundoscopic exam reveals sharp disc margins. Pupils equal, briskly reactive to light. Extraocular movements full without nystagmus. Visual fields full to confrontation. Hearing intact. Facial sensation intact. Face, tongue, palate moves normally and symmetrically.  Motor: Normal bulk and tone. Normal strength in all tested extremity muscles. Diminished fine finger movements on the right. Mild right grip weakness. Orbits left over right upper extremity. Right shoulder abduction limited due to rotator cuff pain Sensory.: intact to touch and pinprick and vibratory sensation.  Coordination: Rapid alternating movements normal in all extremities. Finger-to-nose and heel-to-shin performed accurately bilaterally. Gait and Station: Arises from chair without difficulty. Stance is normal. Gait demonstrates normal stride length and balance . Able to heel, toe and tandem walk without difficulty.  Reflexes: 1+ and symmetric. Toes downgoing.   NIHSS  0 Modified Rankin  1  ASSESSMENT: 79 year Caucasian lady with a left frontal white matter infarct in March 2015 likely due to small vessel disease with vascular risk factors of hypertension hyperlipidemia    PLAN: I had a long discussion with the patient with regards to her stroke, discuss and personally reviewed  Imaging studies and stroke workup and answered questions. Continue aspirin for stroke prevention and strict control of hypertension with blood pressure goal below 130/90 and lipids with LDL cholesterol goal below 100 mg percent. Return for followup in 6 months with Charlott Holler, NP or call earlier if necessary.     Note: This document was prepared with digital dictation and possible smart phrase technology. Any transcriptional errors that result from this process are unintentional

## 2013-12-15 ENCOUNTER — Encounter (INDEPENDENT_AMBULATORY_CARE_PROVIDER_SITE_OTHER): Payer: Self-pay

## 2013-12-15 ENCOUNTER — Ambulatory Visit
Admission: RE | Admit: 2013-12-15 | Discharge: 2013-12-15 | Disposition: A | Discharge status: Home / Self Care | Payer: Medicare Other | Source: Ambulatory Visit

## 2013-12-15 DIAGNOSIS — Z1231 Encounter for screening mammogram for malignant neoplasm of breast: Secondary | ICD-10-CM

## 2013-12-16 DIAGNOSIS — Z23 Encounter for immunization: Secondary | ICD-10-CM | POA: Diagnosis not present

## 2013-12-16 DIAGNOSIS — G8191 Hemiplegia, unspecified affecting right dominant side: Secondary | ICD-10-CM | POA: Diagnosis not present

## 2013-12-16 DIAGNOSIS — Z1389 Encounter for screening for other disorder: Secondary | ICD-10-CM | POA: Diagnosis not present

## 2013-12-16 DIAGNOSIS — M19042 Primary osteoarthritis, left hand: Secondary | ICD-10-CM | POA: Diagnosis not present

## 2013-12-16 DIAGNOSIS — M810 Age-related osteoporosis without current pathological fracture: Secondary | ICD-10-CM | POA: Diagnosis not present

## 2013-12-16 DIAGNOSIS — I10 Essential (primary) hypertension: Secondary | ICD-10-CM | POA: Diagnosis not present

## 2013-12-17 DIAGNOSIS — M19011 Primary osteoarthritis, right shoulder: Secondary | ICD-10-CM | POA: Diagnosis not present

## 2013-12-24 ENCOUNTER — Other Ambulatory Visit: Payer: Self-pay | Admitting: Physical Medicine & Rehabilitation

## 2014-01-27 ENCOUNTER — Encounter: Payer: Self-pay | Admitting: Neurology

## 2014-04-07 ENCOUNTER — Other Ambulatory Visit: Payer: Self-pay | Admitting: Physical Medicine & Rehabilitation

## 2014-04-14 DIAGNOSIS — Z79899 Other long term (current) drug therapy: Secondary | ICD-10-CM | POA: Diagnosis not present

## 2014-04-14 DIAGNOSIS — E780 Pure hypercholesterolemia: Secondary | ICD-10-CM | POA: Diagnosis not present

## 2014-04-14 DIAGNOSIS — M19042 Primary osteoarthritis, left hand: Secondary | ICD-10-CM | POA: Diagnosis not present

## 2014-04-14 DIAGNOSIS — Z1389 Encounter for screening for other disorder: Secondary | ICD-10-CM | POA: Diagnosis not present

## 2014-04-14 DIAGNOSIS — Z0000 Encounter for general adult medical examination without abnormal findings: Secondary | ICD-10-CM | POA: Diagnosis not present

## 2014-05-20 DIAGNOSIS — M1812 Unilateral primary osteoarthritis of first carpometacarpal joint, left hand: Secondary | ICD-10-CM | POA: Diagnosis not present

## 2014-05-20 DIAGNOSIS — G5602 Carpal tunnel syndrome, left upper limb: Secondary | ICD-10-CM | POA: Diagnosis not present

## 2014-05-22 DIAGNOSIS — H04123 Dry eye syndrome of bilateral lacrimal glands: Secondary | ICD-10-CM | POA: Diagnosis not present

## 2014-05-22 DIAGNOSIS — Z961 Presence of intraocular lens: Secondary | ICD-10-CM | POA: Diagnosis not present

## 2014-05-27 DIAGNOSIS — M1812 Unilateral primary osteoarthritis of first carpometacarpal joint, left hand: Secondary | ICD-10-CM | POA: Diagnosis not present

## 2014-05-27 DIAGNOSIS — M79642 Pain in left hand: Secondary | ICD-10-CM | POA: Diagnosis not present

## 2014-05-27 DIAGNOSIS — M79641 Pain in right hand: Secondary | ICD-10-CM | POA: Diagnosis not present

## 2014-06-10 ENCOUNTER — Ambulatory Visit: Payer: 59 | Admitting: Nurse Practitioner

## 2014-06-22 ENCOUNTER — Ambulatory Visit: Payer: 59 | Admitting: Neurology

## 2014-06-24 ENCOUNTER — Ambulatory Visit (INDEPENDENT_AMBULATORY_CARE_PROVIDER_SITE_OTHER): Payer: Medicare Other | Admitting: Neurology

## 2014-06-24 ENCOUNTER — Encounter: Payer: Self-pay | Admitting: Neurology

## 2014-06-24 VITALS — BP 143/94 | HR 84 | Resp 14 | Wt 124.0 lb

## 2014-06-24 DIAGNOSIS — I6529 Occlusion and stenosis of unspecified carotid artery: Secondary | ICD-10-CM

## 2014-06-24 NOTE — Progress Notes (Signed)
[facility] [address] [place]. [place] [address] [phone]       OFFICE FOLLOW-UP NOTE  [name] Date of Birth:  1933-08-14 Medical Record Number:  [id number]   HPI: 63 year Caucasian lady is seen today for the first office followup visit for Hospital admission for stroke in March 2015. She was admitted on 05/12/13 with   five-day history of right facial numbness and hand weakness and difficulty walking. MRI scan showed an acute 14 mm left posterior frontal white matter and precentral gyrus infarct. MRA showed no large vessel stenosis. Transthoracic echo showed normal ejection fraction. Carotid ultrasound showed no significant obstructive stenosis. Patient's risk factors found included hypertension hyperlipidemia. She started on aspirin. She went to inpatient rehabilitation and showed steady improvement. She has had chronic right shoulder pain from rotator cuff injury . She is tolerating aspirin without bleeding or bruising. She has finished outpatient physical & occupational therapy. She still has a mild distal right hand weakness and diminished fine motor skills. She states her blood pressure is quite well controlled though it is slightly elevated at 149/74 office today. She had lipid profile checked in June 2015 by Dr. Felipa Calhoun and that was fine. She plans to have right shoulder surgery for rotator cuff and one teeth pulled out and has questions about stroke risk and stopping aspirin. Update 06/24/2014 : She returns for follow-up after last visit 6 once ago. She continues to do well from neurovascular standpoint without recurrent stroke or TIA symptoms. She is tolerating aspirin well without bleeding or bruising. She states her blood pressure is under good control and lipid profile was checked a couple of months ago by Dr. Felipa Calhoun and was fine. She did not undergo right shoulder surgery as Dr. Tamera Calhoun her orthopedic surgeon felt she was not a good surgical candidate.  She continues to have diminished fine motor skills in the right hand and states that handwriting is not good. She has no new complaints today. ROS:   14 system review of systems is positive for   shoulder and hand pain and all other systems negative. PMH:  Past Medical History  Diagnosis Date  . Osteoporosis   . HTN (hypertension)   . Disc     bulging lumbar disc has had steriod injections  x3  . Stroke     Social History:  History   Social History  . Marital Status: Widowed    Spouse Name: N/A  . Number of Children: 4  . Years of Education: college   Occupational History  . CNA    Social History Main Topics  . Smoking status: Never Smoker   . Smokeless tobacco: Not on file  . Alcohol Use: No  . Drug Use: No  . Sexual Activity: No   Other Topics Concern  . Not on file   Social History Narrative   Patient lives at home alone   Patient right handed   Patient drinks coffee (1-2 cups) avg.    Medications:   Current Outpatient Prescriptions on File Prior to Visit  Medication Sig Dispense Refill  . amLODipine (NORVASC) 2.5 MG tablet Take 5 mg by mouth daily.    Marland Kitchen aspirin 325 MG tablet Take 1 tablet (325 mg total) by mouth daily.    Marland Kitchen atorvastatin (LIPITOR) 40 MG tablet Take 40 mg by mouth daily.    . Calcium-Vitamin D (CALTRATE 600 PLUS-VIT D PO) Take 1 tablet by mouth daily.     . cholecalciferol (VITAMIN  D) 1000 UNITS tablet Take 2,000 Units by mouth daily.    . diclofenac sodium (VOLTAREN) 1 % GEL Apply 2 g topically 3 (three) times daily before meals. To right shoulder 3 Tube 1  . DULoxetine (CYMBALTA) 30 MG capsule TAKE (1) CAPSULE DAILY. 30 capsule 3  . lisinopril (PRINIVIL,ZESTRIL) 5 MG tablet Take 5 mg by mouth daily.     . Multiple Vitamins-Minerals (MULTIVITAMIN WITH MINERALS) tablet Take 1 tablet by mouth daily.     No current facility-administered medications on file prior to visit.    Allergies:   Allergies  Allergen Reactions  . Codeine Rash  .  Sulfa Antibiotics Rash    Physical Exam General: Frail petite elderly Caucasian lady, seated, in no evident distress Head: head normocephalic and atraumatic.  Neck: supple with no carotid or supraclavicular bruits Cardiovascular: regular rate and rhythm, no murmurs Musculoskeletal: kyphoscoliosis. Rt shoulder elevation limited by pain.  Old surgical scar right shoulder Skin:  no rash/petichiae Vascular:  Normal pulses all extremities Filed Vitals:   06/24/14 1046  BP: 143/94  Pulse: 84  Resp: 14   Neurologic Exam Mental Status: Awake and fully alert. Oriented to place and time. Recent and remote memory intact. Attention span, concentration and fund of knowledge appropriate. Mood and affect appropriate.  Cranial Nerves: Fundoscopic exam not done   Pupils equal, briskly reactive to light. Extraocular movements full without nystagmus. Visual fields full to confrontation. Hearing intact. Facial sensation intact. Face, tongue, palate moves normally and symmetrically.  Motor: Normal bulk and tone. Normal strength in all tested extremity muscles. Diminished fine finger movements on the right. Mild right grip weakness. Orbits left over right upper extremity. Right shoulder abduction limited due to rotator cuff pain Sensory.: intact to touch and pinprick and vibratory sensation.  Coordination: Rapid alternating movements normal in all extremities. Finger-to-nose and heel-to-shin performed accurately bilaterally. Gait and Station: Arises from chair without difficulty. Stance is normal. Gait demonstrates normal stride length and balance . Able to heel, toe and tandem walk without difficulty.  Reflexes: 1+ and symmetric. Toes downgoing.     ASSESSMENT: 82 year Caucasian lady with a left frontal white matter infarct in March 2015 likely due to small vessel disease with vascular risk factors of hypertension hyperlipidemia    PLAN: I had a long d/w patient about herremote stroke, risk for  recurrent stroke/TIAs, personally independently reviewed imaging studies and stroke evaluation results and answered questions.Continue Aspirin  for secondary stroke prevention and maintain strict control of HT with blood pressure goal below 130/90 and lipids with LDL cholesterol goal below 70 mg percent. Check follow-up carotid ultrasound study. And return for follow-up in 1 year or call earlier if necessary.      Note: This document was prepared with digital dictation and possible smart phrase technology. Any transcriptional errors that result from this process are unintentional

## 2014-06-24 NOTE — Patient Instructions (Signed)
I had a long d/w patient about herremote stroke, risk for recurrent stroke/TIAs, personally independently reviewed imaging studies and stroke evaluation results and answered questions.Continue Aspirin  for secondary stroke prevention and maintain strict control of HT with blood pressure goal below 130/90 and lipids with LDL cholesterol goal below 70 mg percent. Check follow-up carotid ultrasound study. And return for follow-up in 1 year or call earlier if necessary.

## 2014-07-02 ENCOUNTER — Telehealth: Payer: Self-pay | Admitting: Radiology

## 2014-07-02 NOTE — Telephone Encounter (Signed)
Called home and could not leave message, no ans.  Called cell and left v/message to return my call for scheduling Carotid doppler.

## 2014-07-08 DIAGNOSIS — M1812 Unilateral primary osteoarthritis of first carpometacarpal joint, left hand: Secondary | ICD-10-CM | POA: Diagnosis not present

## 2014-07-15 ENCOUNTER — Ambulatory Visit (INDEPENDENT_AMBULATORY_CARE_PROVIDER_SITE_OTHER): Payer: Medicare Other

## 2014-07-15 DIAGNOSIS — I6529 Occlusion and stenosis of unspecified carotid artery: Secondary | ICD-10-CM

## 2014-08-26 ENCOUNTER — Other Ambulatory Visit: Payer: Self-pay | Admitting: Physical Medicine & Rehabilitation

## 2014-11-03 DIAGNOSIS — Z79899 Other long term (current) drug therapy: Secondary | ICD-10-CM | POA: Diagnosis not present

## 2014-11-03 DIAGNOSIS — E780 Pure hypercholesterolemia: Secondary | ICD-10-CM | POA: Diagnosis not present

## 2014-11-03 DIAGNOSIS — I10 Essential (primary) hypertension: Secondary | ICD-10-CM | POA: Diagnosis not present

## 2014-11-03 DIAGNOSIS — G8191 Hemiplegia, unspecified affecting right dominant side: Secondary | ICD-10-CM | POA: Diagnosis not present

## 2014-11-19 DIAGNOSIS — H43812 Vitreous degeneration, left eye: Secondary | ICD-10-CM | POA: Diagnosis not present

## 2014-11-30 ENCOUNTER — Other Ambulatory Visit: Payer: Self-pay

## 2014-11-30 DIAGNOSIS — Z1231 Encounter for screening mammogram for malignant neoplasm of breast: Secondary | ICD-10-CM

## 2014-12-22 ENCOUNTER — Ambulatory Visit: Payer: Medicare Other

## 2014-12-29 ENCOUNTER — Other Ambulatory Visit: Payer: Self-pay | Admitting: Physical Medicine & Rehabilitation

## 2015-01-15 ENCOUNTER — Ambulatory Visit
Admission: RE | Admit: 2015-01-15 | Discharge: 2015-01-15 | Disposition: A | Discharge status: Home / Self Care | Payer: Medicare Other | Source: Ambulatory Visit

## 2015-01-15 DIAGNOSIS — Z1231 Encounter for screening mammogram for malignant neoplasm of breast: Secondary | ICD-10-CM | POA: Diagnosis not present

## 2015-01-20 DIAGNOSIS — H43812 Vitreous degeneration, left eye: Secondary | ICD-10-CM | POA: Diagnosis not present

## 2015-01-20 DIAGNOSIS — Z23 Encounter for immunization: Secondary | ICD-10-CM | POA: Diagnosis not present

## 2015-05-11 DIAGNOSIS — Z79899 Other long term (current) drug therapy: Secondary | ICD-10-CM | POA: Diagnosis not present

## 2015-05-11 DIAGNOSIS — Z01812 Encounter for preprocedural laboratory examination: Secondary | ICD-10-CM | POA: Diagnosis not present

## 2015-05-11 DIAGNOSIS — Z5181 Encounter for therapeutic drug level monitoring: Secondary | ICD-10-CM | POA: Diagnosis not present

## 2015-05-12 DIAGNOSIS — Z01419 Encounter for gynecological examination (general) (routine) without abnormal findings: Secondary | ICD-10-CM | POA: Diagnosis not present

## 2015-05-24 DIAGNOSIS — Z961 Presence of intraocular lens: Secondary | ICD-10-CM | POA: Diagnosis not present

## 2015-05-24 DIAGNOSIS — H04123 Dry eye syndrome of bilateral lacrimal glands: Secondary | ICD-10-CM | POA: Diagnosis not present

## 2015-05-24 DIAGNOSIS — H43812 Vitreous degeneration, left eye: Secondary | ICD-10-CM | POA: Diagnosis not present

## 2015-06-14 DIAGNOSIS — I10 Essential (primary) hypertension: Secondary | ICD-10-CM | POA: Diagnosis not present

## 2015-06-14 DIAGNOSIS — Z1389 Encounter for screening for other disorder: Secondary | ICD-10-CM | POA: Diagnosis not present

## 2015-06-14 DIAGNOSIS — Z79899 Other long term (current) drug therapy: Secondary | ICD-10-CM | POA: Diagnosis not present

## 2015-06-14 DIAGNOSIS — E7800 Pure hypercholesterolemia, unspecified: Secondary | ICD-10-CM | POA: Diagnosis not present

## 2015-06-14 DIAGNOSIS — Z0000 Encounter for general adult medical examination without abnormal findings: Secondary | ICD-10-CM | POA: Diagnosis not present

## 2015-06-14 DIAGNOSIS — M810 Age-related osteoporosis without current pathological fracture: Secondary | ICD-10-CM | POA: Diagnosis not present

## 2015-06-24 ENCOUNTER — Ambulatory Visit (INDEPENDENT_AMBULATORY_CARE_PROVIDER_SITE_OTHER): Payer: Medicare Other | Admitting: Neurology

## 2015-06-24 ENCOUNTER — Encounter: Payer: Self-pay | Admitting: Neurology

## 2015-06-24 VITALS — BP 131/67 | HR 82 | Ht 59.0 in | Wt 133.8 lb

## 2015-06-24 DIAGNOSIS — I6990 Unspecified sequelae of unspecified cerebrovascular disease: Secondary | ICD-10-CM | POA: Diagnosis not present

## 2015-06-24 NOTE — Progress Notes (Signed)
[facility] [address] [place]. [place] [address] [phone]       OFFICE FOLLOW-UP NOTE  Ms. [name] Date of Birth:  1933/04/15 Medical Record Number:  [id number]   HPI: 74 year Caucasian lady is seen today for the first office followup visit for Hospital admission for stroke in March 2015. She was admitted on 05/12/13 with   five-day history of right facial numbness and hand weakness and difficulty walking. MRI scan showed an acute 14 mm left posterior frontal white matter and precentral gyrus infarct. MRA showed no large vessel stenosis. Transthoracic echo showed normal ejection fraction. Carotid ultrasound showed no significant obstructive stenosis. Patient's risk factors found included hypertension hyperlipidemia. She started on aspirin. She went to inpatient rehabilitation and showed steady improvement. She has had chronic right shoulder pain from rotator cuff injury . She is tolerating aspirin without bleeding or bruising. She has finished outpatient physical & occupational therapy. She still has a mild distal right hand weakness and diminished fine motor skills. She states her blood pressure is quite well controlled though it is slightly elevated at 149/74 office today. She had lipid profile checked in June 2015 by Dr. Felipa Eth and that was fine. She plans to have right shoulder surgery for rotator cuff and one teeth pulled out and has questions about stroke risk and stopping aspirin. Update 06/24/2014 : She returns for follow-up after last visit 6 once ago. She continues to do well from neurovascular standpoint without recurrent stroke or TIA symptoms. She is tolerating aspirin well without bleeding or bruising. She states her blood pressure is under good control and lipid profile was checked a couple of months ago by Dr. Felipa Eth and was fine. She did not undergo right shoulder surgery as Dr. Tamera Punt her orthopedic surgeon felt she was not a good surgical candidate.  She continues to have diminished fine motor skills in the right hand and states that handwriting is not good. She has no new complaints today. Update 06/24/15 : She returns for follow-up after last visit a year ago. She is accompanied by her friend Eligah East. Patient continues to do well and has not had any stroke or TIA symptoms now for couple of years. She is tolerating aspirin well without bleeding or bruising. She states her blood pressure is quite well controlled and today it is 131/76 in our office. She is tolerating Lipitor well without myalgias or arthralgias. She had lipid profile checked and is ago and it was satisfactory. Patient is now living with her son but is mostly alone during the day. She has a life alert button. She does admit that her balance is off at times and she feels dizzy particularly when she gets up suddenly and makes a quick turn. She fortunately has had no major falls or injuries. ROS:   14 system review of systems is positive for    eye itching, easy bruising, dizziness and all other systems negative. PMH:  Past Medical History  Diagnosis Date  . Osteoporosis   . HTN (hypertension)   . Disc     bulging lumbar disc has had steriod injections  x3  . Stroke [facility])     Social History:  Social History   Social History  . Marital Status: Widowed    Spouse Name: N/A  . Number of Children: 4  . Years of Education: college   Occupational History  . CNA    Social History Main Topics  . Smoking status: Never Smoker   .  Smokeless tobacco: Not on file  . Alcohol Use: No  . Drug Use: No  . Sexual Activity: No   Other Topics Concern  . Not on file   Social History Narrative   Patient lives at home alone   Patient right handed   Patient drinks coffee (1-2 cups) avg.    Medications:   Current Outpatient Prescriptions on File Prior to Visit  Medication Sig Dispense Refill  . amLODipine (NORVASC) 2.5 MG tablet Take 5 mg by mouth daily.    Marland Kitchen aspirin 325 MG  tablet Take 1 tablet (325 mg total) by mouth daily.    Marland Kitchen atorvastatin (LIPITOR) 40 MG tablet Take 40 mg by mouth daily.    . Calcium-Vitamin D (CALTRATE 600 PLUS-VIT D PO) Take 1 tablet by mouth daily.     . cholecalciferol (VITAMIN D) 1000 UNITS tablet Take 2,000 Units by mouth daily.    . diclofenac sodium (VOLTAREN) 1 % GEL Apply 2 g topically 3 (three) times daily before meals. To right shoulder 3 Tube 1  . DULoxetine (CYMBALTA) 30 MG capsule TAKE (1) CAPSULE DAILY. 30 capsule 3  . lisinopril (PRINIVIL,ZESTRIL) 5 MG tablet Take 5 mg by mouth daily.     . Multiple Vitamins-Minerals (MULTIVITAMIN WITH MINERALS) tablet Take 1 tablet by mouth daily.     No current facility-administered medications on file prior to visit.    Allergies:   Allergies  Allergen Reactions  . Codeine Rash  . Sulfa Antibiotics Rash    Physical Exam General: Frail petite elderly Caucasian lady, seated, in no evident distress Head: head normocephalic and atraumatic.  Neck: supple with no carotid or supraclavicular bruits Cardiovascular: regular rate and rhythm, no murmurs Musculoskeletal: kyphoscoliosis. Rt shoulder elevation limited by pain.  Old surgical scar right shoulder Skin:  no rash/petichiae Vascular:  Normal pulses all extremities Filed Vitals:   06/24/15 1328  BP: 131/67  Pulse: 82   Neurologic Exam Mental Status: Awake and fully alert. Oriented to place and time. Recent and remote memory intact. Attention span, concentration and fund of knowledge appropriate. Mood and affect appropriate.  Cranial Nerves: Fundoscopic exam not done   Pupils equal, briskly reactive to light. Extraocular movements full without nystagmus. Visual fields full to confrontation. Hearing intact. Facial sensation intact. Face, tongue, palate moves normally and symmetrically.  Motor: Normal bulk and tone. Normal strength in all tested extremity muscles. Diminished fine finger movements on the right. Mild right grip weakness.  Orbits left over right upper extremity. Right shoulder abduction limited due to rotator cuff pain Sensory.: intact to touch and pinprick and vibratory sensation.  Coordination: Rapid alternating movements normal in all extremities. Finger-to-nose and heel-to-shin performed accurately bilaterally. Gait and Station: Arises from chair without difficulty. Stance is normal. Gait demonstrates normal stride length and balance . Able to heel, toe and tandem walk without difficulty.  Reflexes: 1+ and symmetric. Toes downgoing.     ASSESSMENT: 43 year Caucasian lady with a left frontal white matter infarct in March 2015 likely due to small vessel disease with vascular risk factors of hypertension hyperlipidemia    PLAN: I had a long d/w patient and her friend Eligah East about her remote stroke, risk for recurrent stroke/TIAs, personally independently reviewed imaging studies and stroke evaluation results and answered questions.Continue aspirin 325 mg daily  for secondary stroke prevention and maintain strict control of hypertension with blood pressure goal below 130/90, diabetes with hemoglobin A1c goal below 6.5% and lipids with LDL cholesterol goal below 70  mg/dL. I also advised the patient to eat a healthy diet with plenty of whole grains, cereals, fruits and vegetables, exercise regularly and maintain ideal body weight .I also advised her fall and safety precautions and recommend that she use a cane at all times when walking outdoors. Since the patient has been stroke free for a while now on a do not believe routine neurological follow-up is necessary with me. She may continue follow-up with her primary care physician and have follow-up carotid ultrasound checked every other year. She may be referred back in the future as necessary Antony Contras, MD    Note: This document was prepared with digital dictation and possible smart phrase technology. Any transcriptional errors that result from this  process are unintentional

## 2015-06-24 NOTE — Patient Instructions (Signed)
I had a long d/w patient and her friend Eligah East about her remote stroke, risk for recurrent stroke/TIAs, personally independently reviewed imaging studies and stroke evaluation results and answered questions.Continue aspirin 325 mg daily  for secondary stroke prevention and maintain strict control of hypertension with blood pressure goal below 130/90, diabetes with hemoglobin A1c goal below 6.5% and lipids with LDL cholesterol goal below 70 mg/dL. I also advised the patient to eat a healthy diet with plenty of whole grains, cereals, fruits and vegetables, exercise regularly and maintain ideal body weight .I also advised her fall and safety precautions and recommend that she use a cane at all times when walking outdoors. Since the patient has been stroke free for a while now on a do not believe routine neurological follow-up is necessary with me. She may continue follow-up with her primary care physician and have follow-up carotid ultrasound checked every other year. She may be referred back in the future as necessary Fall Prevention in the [place] can cause injuries. They can happen to people of all ages. There are many things you can do to make your home safe and to help prevent falls.  WHAT CAN I DO ON THE OUTSIDE OF MY HOME?  Regularly fix the edges of walkways and driveways and fix any cracks.  Remove anything that might make you trip as you walk through a door, such as a raised step or threshold.  Trim any bushes or trees on the path to your home.  Use bright outdoor lighting.  Clear any walking paths of anything that might make someone trip, such as rocks or tools.  Regularly check to see if handrails are loose or broken. Make sure that both sides of any steps have handrails.  Any raised decks and porches should have guardrails on the edges.  Have any leaves, snow, or ice cleared regularly.  Use sand or salt on walking paths during winter.  Clean up any spills in your garage  right away. This includes oil or grease spills. WHAT CAN I DO IN THE BATHROOM?   Use night lights.  Install grab bars by the toilet and in the tub and shower. Do not use towel bars as grab bars.  Use non-skid mats or decals in the tub or shower.  If you need to sit down in the shower, use a plastic, non-slip stool.  Keep the floor dry. Clean up any water that spills on the floor as soon as it happens.  Remove soap buildup in the tub or shower regularly.  Attach bath mats securely with double-sided non-slip rug tape.  Do not have throw rugs and other things on the floor that can make you trip. WHAT CAN I DO IN THE BEDROOM?  Use night lights.  Make sure that you have a light by your bed that is easy to reach.  Do not use any sheets or blankets that are too big for your bed. They should not hang down onto the floor.  Have a firm chair that has side arms. You can use this for support while you get dressed.  Do not have throw rugs and other things on the floor that can make you trip. WHAT CAN I DO IN THE KITCHEN?  Clean up any spills right away.  Avoid walking on wet floors.  Keep items that you use a lot in easy-to-reach places.  If you need to reach something above you, use a strong step stool that has a grab bar.  Keep electrical cords out of the way.  Do not use floor polish or wax that makes floors slippery. If you must use wax, use non-skid floor wax.  Do not have throw rugs and other things on the floor that can make you trip. WHAT CAN I DO WITH MY STAIRS?  Do not leave any items on the stairs.  Make sure that there are handrails on both sides of the stairs and use them. Fix handrails that are broken or loose. Make sure that handrails are as long as the stairways.  Check any carpeting to make sure that it is firmly attached to the stairs. Fix any carpet that is loose or worn.  Avoid having throw rugs at the top or bottom of the stairs. If you do have throw rugs,  attach them to the floor with carpet tape.  Make sure that you have a light switch at the top of the stairs and the bottom of the stairs. If you do not have them, ask someone to add them for you. WHAT ELSE CAN I DO TO HELP PREVENT FALLS?  Wear shoes that:  Do not have high heels.  Have rubber bottoms.  Are comfortable and fit you well.  Are closed at the toe. Do not wear sandals.  If you use a stepladder:  Make sure that it is fully opened. Do not climb a closed stepladder.  Make sure that both sides of the stepladder are locked into place.  Ask someone to hold it for you, if possible.  Clearly mark and make sure that you can see:  Any grab bars or handrails.  First and last steps.  Where the edge of each step is.  Use tools that help you move around (mobility aids) if they are needed. These include:  Canes.  Walkers.  Scooters.  Crutches.  Turn on the lights when you go into a dark area. Replace any light bulbs as soon as they burn out.  Set up your furniture so you have a clear path. Avoid moving your furniture around.  If any of your floors are uneven, fix them.  If there are any pets around you, be aware of where they are.  Review your medicines with your doctor. Some medicines can make you feel dizzy. This can increase your chance of falling. Ask your doctor what other things that you can do to help prevent falls.   This information is not intended to replace advice given to you by your health care provider. Make sure you discuss any questions you have with your health care provider.   Document Released: 12/24/2008 Document Revised: 07/14/2014 Document Reviewed: 04/03/2014 Elsevier Interactive Patient Education [employer].

## 2015-06-25 NOTE — Progress Notes (Signed)
Dr.Sethi office notes fax to patients PCP, Dr Lajean Manes, Fax three times and receive. Pt only needs to follow up as needed.

## 2015-07-12 ENCOUNTER — Emergency Department (HOSPITAL_BASED_OUTPATIENT_CLINIC_OR_DEPARTMENT_OTHER): Payer: Medicare Other

## 2015-07-12 ENCOUNTER — Encounter (HOSPITAL_BASED_OUTPATIENT_CLINIC_OR_DEPARTMENT_OTHER): Payer: Self-pay

## 2015-07-12 ENCOUNTER — Emergency Department (HOSPITAL_COMMUNITY): Payer: Medicare Other

## 2015-07-12 ENCOUNTER — Emergency Department (HOSPITAL_BASED_OUTPATIENT_CLINIC_OR_DEPARTMENT_OTHER)
Admission: EM | Admit: 2015-07-12 | Discharge: 2015-07-13 | Disposition: A | Discharge status: Home / Self Care | Payer: Medicare Other | Attending: Emergency Medicine | Admitting: Emergency Medicine

## 2015-07-12 DIAGNOSIS — R51 Headache: Secondary | ICD-10-CM | POA: Diagnosis not present

## 2015-07-12 DIAGNOSIS — I69398 Other sequelae of cerebral infarction: Secondary | ICD-10-CM | POA: Diagnosis not present

## 2015-07-12 DIAGNOSIS — R531 Weakness: Secondary | ICD-10-CM | POA: Insufficient documentation

## 2015-07-12 DIAGNOSIS — I10 Essential (primary) hypertension: Secondary | ICD-10-CM | POA: Diagnosis not present

## 2015-07-12 DIAGNOSIS — R42 Dizziness and giddiness: Secondary | ICD-10-CM | POA: Insufficient documentation

## 2015-07-12 DIAGNOSIS — Z7982 Long term (current) use of aspirin: Secondary | ICD-10-CM | POA: Diagnosis not present

## 2015-07-12 DIAGNOSIS — R0602 Shortness of breath: Secondary | ICD-10-CM | POA: Insufficient documentation

## 2015-07-12 DIAGNOSIS — Z79899 Other long term (current) drug therapy: Secondary | ICD-10-CM | POA: Diagnosis not present

## 2015-07-12 LAB — URINALYSIS, ROUTINE W REFLEX MICROSCOPIC
Bilirubin Urine: NEGATIVE
GLUCOSE, UA: NEGATIVE mg/dL
Hgb urine dipstick: NEGATIVE
Ketones, ur: NEGATIVE mg/dL
NITRITE: NEGATIVE
Protein, ur: NEGATIVE mg/dL
Specific Gravity, Urine: 1.007 (ref 1.005–1.030)
pH: 6 (ref 5.0–8.0)

## 2015-07-12 LAB — PROTIME-INR
INR: 0.96 (ref 0.00–1.49)
Prothrombin Time: 13 seconds (ref 11.6–15.2)

## 2015-07-12 LAB — COMPREHENSIVE METABOLIC PANEL
ALT: 18 U/L (ref 14–54)
ANION GAP: 9 (ref 5–15)
AST: 33 U/L (ref 15–41)
Albumin: 4.1 g/dL (ref 3.5–5.0)
Alkaline Phosphatase: 93 U/L (ref 38–126)
BUN: 20 mg/dL (ref 6–20)
CALCIUM: 9.5 mg/dL (ref 8.9–10.3)
CHLORIDE: 105 mmol/L (ref 101–111)
CO2: 21 mmol/L — ABNORMAL LOW (ref 22–32)
Creatinine, Ser: 1.03 mg/dL — ABNORMAL HIGH (ref 0.44–1.00)
GFR calc non Af Amer: 50 mL/min — ABNORMAL LOW (ref >60)
GFR, EST AFRICAN AMERICAN: 58 mL/min — AB (ref >60)
Glucose, Bld: 111 mg/dL — ABNORMAL HIGH (ref 65–99)
Potassium: 3.7 mmol/L (ref 3.5–5.1)
SODIUM: 135 mmol/L (ref 135–145)
Total Bilirubin: 0.7 mg/dL (ref 0.3–1.2)
Total Protein: 7.7 g/dL (ref 6.5–8.1)

## 2015-07-12 LAB — CBC WITH DIFFERENTIAL/PLATELET
BASOS PCT: 1 %
Basophils Absolute: 0.1 10*3/uL (ref 0.0–0.1)
Eosinophils Absolute: 0.1 10*3/uL (ref 0.0–0.7)
Eosinophils Relative: 2 %
HEMATOCRIT: 40.4 % (ref 36.0–46.0)
HEMOGLOBIN: 14 g/dL (ref 12.0–15.0)
LYMPHS ABS: 3 10*3/uL (ref 0.7–4.0)
LYMPHS PCT: 45 %
MCH: 32.5 pg (ref 26.0–34.0)
MCHC: 34.7 g/dL (ref 30.0–36.0)
MCV: 93.7 fL (ref 78.0–100.0)
MONO ABS: 0.7 10*3/uL (ref 0.1–1.0)
MONOS PCT: 11 %
NEUTROS ABS: 2.7 10*3/uL (ref 1.7–7.7)
NEUTROS PCT: 41 %
PLATELETS: 324 10*3/uL (ref 150–400)
RBC: 4.31 MIL/uL (ref 3.87–5.11)
RDW: 13.2 % (ref 11.5–15.5)
WBC: 6.5 10*3/uL (ref 4.0–10.5)

## 2015-07-12 LAB — URINE MICROSCOPIC-ADD ON

## 2015-07-12 LAB — TROPONIN I: Troponin I: <0.03 ng/mL (ref <0.031)

## 2015-07-12 MED ORDER — ONDANSETRON HCL 4 MG/2ML IJ SOLN
4.0000 mg | Freq: Once | INTRAMUSCULAR | Status: AC
  Administered 2015-07-12: 4 mg via INTRAVENOUS
  Filled 2015-07-12: qty 2

## 2015-07-12 MED ORDER — MECLIZINE HCL 25 MG PO TABS: 25.0000 mg | ORAL_TABLET | Freq: Three times a day (TID) | ORAL | Status: DC | PRN

## 2015-07-12 MED ORDER — SODIUM CHLORIDE 0.9 % IV SOLN
INTRAVENOUS | Status: DC
  Administered 2015-07-12 – 2015-07-13 (×2): via INTRAVENOUS

## 2015-07-12 MED ORDER — MECLIZINE HCL 25 MG PO TABS
25.0000 mg | ORAL_TABLET | Freq: Once | ORAL | Status: AC
  Administered 2015-07-12: 25 mg via ORAL
  Filled 2015-07-12: qty 1

## 2015-07-12 NOTE — ED Notes (Signed)
Patient transported to MRI 

## 2015-07-12 NOTE — ED Provider Notes (Signed)
CSN: [id number]     Arrival date & time 07/12/15  1703 History  By signing my name below, I, Dora Sims, attest that this documentation has been prepared under the direction and in the presence of physician practitioner, Fredia Sorrow, MD. Electronically Signed: Dora Sims, Scribe. 07/12/2015. 6:16 PM.    Chief Complaint  Patient presents with  . Dizziness    Patient is a 82 y.o. female presenting with dizziness. The history is provided by the patient. No language interpreter was used.  Dizziness Quality:  Room spinning Severity:  Severe Onset quality:  Sudden Duration:  2 days Timing:  Constant Progression:  Unable to specify Chronicity:  New Context: bending over, physical activity and standing up   Worsened by:  Movement Associated symptoms: headaches, nausea, shortness of breath and weakness (right arm)   Associated symptoms: no chest pain, no diarrhea, no tinnitus, no vision changes and no vomiting   Headaches:    Severity:  Unable to specify   Onset quality:  Sudden   Duration:  2 days   Timing:  Unable to specify   Progression:  Unable to specify   Chronicity:  New Nausea:    Severity:  Unable to specify   Onset quality:  Sudden   Duration:  2 days   Timing:  Unable to specify   Progression:  Unable to specify Shortness of breath:    Severity:  Unable to specify   Onset quality:  Sudden   Duration:  2 days   Timing:  Unable to specify   Progression:  Unable to specify Weakness:    Severity:  Unable to specify   Duration:  2 days   Onset quality:  Sudden   Timing:  Unable to specify   Progression:  Unable to specify   Chronicity:  New Risk factors: hx of stroke      HPI Comments: Kathy Calhoun is a 82 y.o. female with h/o stroke who presents to the Emergency Department complaining of sudden onset, intermittent, severe, dizziness and lightheadedness for the last two days. Pt states that her symptoms began during the early morning hours 2 days ago. Pt  reports associated headache radiating into her posterior neck beginning during the afternoon 2 days ago. She states that she feels like the room is spinning when her symptoms present. Pt denies experiencing similar episodes in the past. She endorses associated nausea with movement, decreased appetite due to nausea, weakness, trouble swallowing, and mild shortness of breath since onset as well. Pt suffered a stroke 2 years ago. She denies vomiting or any other associated symptoms.  Past Medical History  Diagnosis Date  . Osteoporosis   . HTN (hypertension)   . Disc     bulging lumbar disc has had steriod injections  x3  . Stroke [facility])    Past Surgical History  Procedure Laterality Date  . Cataract extraction      bilaterally 2011  . Appendectomy    . Tubal ligation    . Hysterotomy    . Oophorectomy    . Laparotomy      large benign mass removed 1993  . Nasal septum surgery    . Mouth surgery     Family History  Problem Relation Age of Onset  . Cancer Father   . Dementia Brother   . Dementia Sister    Social History  Substance Use Topics  . Smoking status: Never Smoker   . Smokeless tobacco: None  . Alcohol Use: No  OB History    No data available     Review of Systems  Constitutional: Positive for fever and appetite change (decreased). Negative for chills.  HENT: Positive for trouble swallowing. Negative for rhinorrhea, sore throat and tinnitus.   Eyes: Negative for visual disturbance.  Respiratory: Positive for shortness of breath. Negative for cough.   Cardiovascular: Negative for chest pain.  Gastrointestinal: Positive for nausea. Negative for vomiting, abdominal pain and diarrhea.  Genitourinary: Negative for dysuria.  Musculoskeletal: Positive for back pain, joint swelling (bilateral ankles) and neck pain (posterior).  Skin: Negative for rash.  Neurological: Positive for dizziness, weakness (right arm), light-headedness and headaches. Negative for numbness.   Hematological: Does not bruise/bleed easily.  Psychiatric/Behavioral: Negative for confusion.    Allergies  Codeine and Sulfa antibiotics  Home Medications   Prior to Admission medications   Medication Sig Start Date End Date Taking? Authorizing Provider  amLODipine (NORVASC) 2.5 MG tablet Take 5 mg by mouth daily. 05/21/13   Bary Leriche, PA-C  aspirin 325 MG tablet Take 1 tablet (325 mg total) by mouth daily. 05/21/13   Ivan Anchors Love, PA-C  atorvastatin (LIPITOR) 40 MG tablet Take 40 mg by mouth daily.    Historical Provider, MD  Calcium-Vitamin D (CALTRATE 600 PLUS-VIT D PO) Take 1 tablet by mouth daily.     Historical Provider, MD  cholecalciferol (VITAMIN D) 1000 UNITS tablet Take 2,000 Units by mouth daily.    Historical Provider, MD  diclofenac sodium (VOLTAREN) 1 % GEL Apply 2 g topically 3 (three) times daily before meals. To right shoulder 05/21/13   Ivan Anchors Love, PA-C  DULoxetine (CYMBALTA) 30 MG capsule TAKE (1) CAPSULE DAILY. 08/26/14   Charlett Blake, MD  lisinopril (PRINIVIL,ZESTRIL) 5 MG tablet Take 5 mg by mouth daily.  08/05/13   Historical Provider, MD  meclizine (ANTIVERT) 25 MG tablet Take 1 tablet (25 mg total) by mouth 3 (three) times daily as needed for dizziness. 07/12/15   Fredia Sorrow, MD  Multiple Vitamins-Minerals (MULTIVITAMIN WITH MINERALS) tablet Take 1 tablet by mouth daily.    Historical Provider, MD   BP 151/72 mmHg  Pulse 86  Temp(Src) 98.3 F (36.8 C) (Oral)  Resp 16  SpO2 95% Physical Exam  Constitutional: She is oriented to person, place, and time. She appears well-developed and well-nourished. No distress.  HENT:  Head: Normocephalic and atraumatic.  Mouth/Throat: Mucous membranes are normal.  Eyes: Conjunctivae and EOM are normal. Pupils are equal, round, and reactive to light.  Neck: Neck supple. No tracheal deviation present.  Cardiovascular: Normal rate, regular rhythm and normal heart sounds.   Pulmonary/Chest: Effort normal and  breath sounds normal. No respiratory distress.  Abdominal: Soft. Bowel sounds are normal. There is no tenderness.  Musculoskeletal:  Trace edema to bilateral legs. Weakness in the right arm.  Neurological: She is alert and oriented to person, place, and time.  Dizziness while following fingers.  Skin: Skin is warm and dry.  Psychiatric: She has a normal mood and affect. Her behavior is normal.  Nursing note and vitals reviewed.   ED Course  Procedures (including critical care time)  DIAGNOSTIC STUDIES: Oxygen Saturation is 96% on RA, adequate by my interpretation.    COORDINATION OF CARE: 6:16 PM Discussed treatment plan with pt at bedside and pt agreed to plan.  Medications  0.9 %  sodium chloride infusion ( Intravenous New Bag/Given 07/12/15 1839)  meclizine (ANTIVERT) tablet 25 mg (25 mg Oral Given 07/12/15 1839)  ondansetron [facility]) injection 4 mg (4 mg Intravenous Given 07/12/15 1840)    Results for orders placed or performed during the hospital encounter of 07/12/15  CBC with Differential  Result Value Ref Range   WBC 6.5 4.0 - 10.5 K/uL   RBC 4.31 3.87 - 5.11 MIL/uL   Hemoglobin 14.0 12.0 - 15.0 g/dL   HCT 40.4 36.0 - 46.0 %   MCV 93.7 78.0 - 100.0 fL   MCH 32.5 26.0 - 34.0 pg   MCHC 34.7 30.0 - 36.0 g/dL   RDW 13.2 11.5 - 15.5 %   Platelets 324 150 - 400 K/uL   Neutrophils Relative % 41 %   Neutro Abs 2.7 1.7 - 7.7 K/uL   Lymphocytes Relative 45 %   Lymphs Abs 3.0 0.7 - 4.0 K/uL   Monocytes Relative 11 %   Monocytes Absolute 0.7 0.1 - 1.0 K/uL   Eosinophils Relative 2 %   Eosinophils Absolute 0.1 0.0 - 0.7 K/uL   Basophils Relative 1 %   Basophils Absolute 0.1 0.0 - 0.1 K/uL  Comprehensive metabolic panel  Result Value Ref Range   Sodium 135 135 - 145 mmol/L   Potassium 3.7 3.5 - 5.1 mmol/L   Chloride 105 101 - 111 mmol/L   CO2 21 (L) 22 - 32 mmol/L   Glucose, Bld 111 (H) 65 - 99 mg/dL   BUN 20 6 - 20 mg/dL   Creatinine, Ser 1.03 (H) 0.44 - 1.00 mg/dL    Calcium 9.5 8.9 - 10.3 mg/dL   Total Protein 7.7 6.5 - 8.1 g/dL   Albumin 4.1 3.5 - 5.0 g/dL   AST 33 15 - 41 U/L   ALT 18 14 - 54 U/L   Alkaline Phosphatase 93 38 - 126 U/L   Total Bilirubin 0.7 0.3 - 1.2 mg/dL   GFR calc non Af Amer 50 (L) >60 mL/min   GFR calc Af Amer 58 (L) >60 mL/min   Anion gap 9 5 - 15  Troponin I  Result Value Ref Range   Troponin I <0.03 <0.031 ng/mL  Protime-INR  Result Value Ref Range   Prothrombin Time 13.0 11.6 - 15.2 seconds   INR 0.96 0.00 - 1.49   Ct Head Wo Contrast  07/12/2015  CLINICAL DATA:  Three day history of headache and dizziness. EXAM: CT HEAD WITHOUT CONTRAST TECHNIQUE: Contiguous axial images were obtained from the base of the skull through the vertex without intravenous contrast. COMPARISON:  Head CT 05/12/2013 and MRI brain 05/14/2013 FINDINGS: Stable age related cerebral atrophy, ventriculomegaly and periventricular white matter disease. No extra-axial fluid collections are identified. Remote lacunar type infarct in the left centrum semiovale. No CT findings for acute hemispheric infarction or intracranial hemorrhage. No mass lesions. The brainstem and cerebellum are normal. IMPRESSION: Stable age related cerebral atrophy, ventriculomegaly and fairly extensive periventricular white matter disease. No acute intracranial findings or mass lesion. Electronically Signed   By: Marijo Sanes M.D.   On: 07/12/2015 17:43     EKG Interpretation   Date/Time:  Monday Jul 12 2015 17:36:50 EDT Ventricular Rate:  91 PR Interval:  144 QRS Duration: 94 QT Interval:  360 QTC Calculation: 443 R Axis:   -2 Text Interpretation:  Sinus rhythm Ventricular premature complex Low  voltage, extremity and precordial leads Probable anteroseptal infarct, old  Confirmed by Aizlynn Digilio  MD, Dawid Dupriest [phone]) on 07/12/2015 5:49:02 PM      MDM   Final diagnoses:  Vertigo    Patient with onset  of vertigo also headache. Head CT negative. Patient with prior history of  stroke with weakness to her right arm. Patient will be transferred to [place] for MRI of brain. If negative can be treated with antivert. Prescription provided already. Patient workup otherwise negative.    I personally performed the services described in this documentation, which was scribed in my presence. The recorded information has been reviewed and is accurate.     Fredia Sorrow, MD 07/12/15 2019

## 2015-07-12 NOTE — Discharge Instructions (Signed)
Dizziness °Dizziness is a common problem. It makes you feel unsteady or lightheaded. You may feel like you are about to pass out (faint). Dizziness can lead to injury if you stumble or fall. Anyone can get dizzy, but dizziness is more common in older adults. This condition can be caused by a number of things, including: °· Medicines. °· Dehydration. °· Illness. °HOME CARE °Following these instructions may help with your condition: °Eating and Drinking °· Drink enough fluid to keep your pee (urine) clear or pale yellow. This helps to keep you from getting dehydrated. Try to drink more clear fluids, such as water. °· Do not drink alcohol. °· Limit how much caffeine you drink or eat if told by your doctor. °· Limit how much salt you drink or eat if told by your doctor. °Activity °· Avoid making quick movements. °¨ When you stand up from sitting in a chair, steady yourself until you feel okay. °¨ In the morning, first sit up on the side of the bed. When you feel okay, stand slowly while you hold onto something. Do this until you know that your balance is fine. °· Move your legs often if you need to stand in one place for a long time. Tighten and relax your muscles in your legs while you are standing. °· Do not drive or use heavy machinery if you feel dizzy. °· Avoid bending down if you feel dizzy. Place items in your home so that they are easy for you to reach without leaning over. °Lifestyle °· Do not use any tobacco products, including cigarettes, chewing tobacco, or electronic cigarettes. If you need help quitting, ask your doctor. °· Try to lower your stress level, such as with yoga or meditation. Talk with your doctor if you need help. °General Instructions °· Watch your dizziness for any changes. °· Take medicines only as told by your doctor. Talk with your doctor if you think that your dizziness is caused by a medicine that you are taking. °· Tell a friend or a family member that you are feeling dizzy. If he or  she notices any changes in your behavior, have this person call your doctor. °· Keep all follow-up visits as told by your doctor. This is important. °GET HELP IF: °· Your dizziness does not go away. °· Your dizziness or light-headedness gets worse. °· You feel sick to your stomach (nauseous). °· You have trouble hearing. °· You have new symptoms. °· You are unsteady on your feet or you feel like the room is spinning. °GET HELP RIGHT AWAY IF: °· You throw up (vomit) or have diarrhea and are unable to eat or drink anything. °· You have trouble: °¨ Talking. °¨ Walking. °¨ Swallowing. °¨ Using your arms, hands, or legs. °· You feel generally weak. °· You are not thinking clearly or you have trouble forming sentences. It may take a friend or family member to notice this. °· You have: °¨ Chest pain. °¨ Pain in your belly (abdomen). °¨ Shortness of breath. °¨ Sweating. °· Your vision changes. °· You are bleeding. °· You have a headache. °· You have neck pain or a stiff neck. °· You have a fever. °  °This information is not intended to replace advice given to you by your health care provider. Make sure you discuss any questions you have with your health care provider. °  °Document Released: 02/16/2011 Document Revised: 07/14/2014 Document Reviewed: 02/23/2014 °Elsevier Interactive Patient Education ©2016 [employer] ° °

## 2015-07-12 NOTE — ED Notes (Signed)
C/o dizziness and HA x 3 days-NAD-A/O-walks with own cane-presents to triage in w/c

## 2015-07-13 DIAGNOSIS — R42 Dizziness and giddiness: Secondary | ICD-10-CM | POA: Diagnosis not present

## 2015-07-13 NOTE — ED Provider Notes (Signed)
Patient presented earlier tonight for episode of dizziness and was evaluated by Dr. Helane Gunther. CT head and labs were normal and she was transferred here for MRI of brain to rule out intracranial process. MRI showed no acute intracranial process. On reexamination, the patient was comfortable, drinking water, and denying any dizziness. She noted that her dizziness often happens when she turns her head quickly, suggesting a peripheral process such as BPPV. She has already been given Rx for meclizine from Dr. Helane Gunther and per his instruction is stable for d/c since MRI negative. I have discussed follow-up with PCP and patient voiced understanding. Patient discharged in satisfactory condition.  Sharlett Iles, MD 07/13/15 [phone]

## 2015-07-19 DIAGNOSIS — H8113 Benign paroxysmal vertigo, bilateral: Secondary | ICD-10-CM | POA: Diagnosis not present

## 2015-07-19 DIAGNOSIS — I10 Essential (primary) hypertension: Secondary | ICD-10-CM | POA: Diagnosis not present

## 2015-07-20 DIAGNOSIS — H8112 Benign paroxysmal vertigo, left ear: Secondary | ICD-10-CM | POA: Diagnosis not present

## 2015-07-20 DIAGNOSIS — R262 Difficulty in walking, not elsewhere classified: Secondary | ICD-10-CM | POA: Diagnosis not present

## 2015-07-20 DIAGNOSIS — R42 Dizziness and giddiness: Secondary | ICD-10-CM | POA: Diagnosis not present

## 2015-07-20 DIAGNOSIS — M2560 Stiffness of unspecified joint, not elsewhere classified: Secondary | ICD-10-CM | POA: Diagnosis not present

## 2015-07-22 DIAGNOSIS — R42 Dizziness and giddiness: Secondary | ICD-10-CM | POA: Diagnosis not present

## 2015-07-22 DIAGNOSIS — H8112 Benign paroxysmal vertigo, left ear: Secondary | ICD-10-CM | POA: Diagnosis not present

## 2015-07-22 DIAGNOSIS — M2560 Stiffness of unspecified joint, not elsewhere classified: Secondary | ICD-10-CM | POA: Diagnosis not present

## 2015-07-22 DIAGNOSIS — R262 Difficulty in walking, not elsewhere classified: Secondary | ICD-10-CM | POA: Diagnosis not present

## 2015-07-29 DIAGNOSIS — M2560 Stiffness of unspecified joint, not elsewhere classified: Secondary | ICD-10-CM | POA: Diagnosis not present

## 2015-07-29 DIAGNOSIS — R42 Dizziness and giddiness: Secondary | ICD-10-CM | POA: Diagnosis not present

## 2015-07-29 DIAGNOSIS — R262 Difficulty in walking, not elsewhere classified: Secondary | ICD-10-CM | POA: Diagnosis not present

## 2015-07-29 DIAGNOSIS — H8112 Benign paroxysmal vertigo, left ear: Secondary | ICD-10-CM | POA: Diagnosis not present

## 2015-08-05 DIAGNOSIS — R42 Dizziness and giddiness: Secondary | ICD-10-CM | POA: Diagnosis not present

## 2015-08-05 DIAGNOSIS — M2560 Stiffness of unspecified joint, not elsewhere classified: Secondary | ICD-10-CM | POA: Diagnosis not present

## 2015-08-05 DIAGNOSIS — R262 Difficulty in walking, not elsewhere classified: Secondary | ICD-10-CM | POA: Diagnosis not present

## 2015-08-05 DIAGNOSIS — H8112 Benign paroxysmal vertigo, left ear: Secondary | ICD-10-CM | POA: Diagnosis not present

## 2015-08-16 DIAGNOSIS — M47812 Spondylosis without myelopathy or radiculopathy, cervical region: Secondary | ICD-10-CM | POA: Diagnosis not present

## 2015-11-22 DIAGNOSIS — I69398 Other sequelae of cerebral infarction: Secondary | ICD-10-CM | POA: Diagnosis not present

## 2015-11-22 DIAGNOSIS — H6123 Impacted cerumen, bilateral: Secondary | ICD-10-CM | POA: Insufficient documentation

## 2015-11-22 DIAGNOSIS — H9193 Unspecified hearing loss, bilateral: Secondary | ICD-10-CM | POA: Diagnosis not present

## 2015-11-22 DIAGNOSIS — Z8673 Personal history of transient ischemic attack (TIA), and cerebral infarction without residual deficits: Secondary | ICD-10-CM | POA: Diagnosis not present

## 2015-11-22 DIAGNOSIS — H8110 Benign paroxysmal vertigo, unspecified ear: Secondary | ICD-10-CM | POA: Insufficient documentation

## 2015-11-29 DIAGNOSIS — H903 Sensorineural hearing loss, bilateral: Secondary | ICD-10-CM | POA: Diagnosis not present

## 2015-11-29 DIAGNOSIS — R42 Dizziness and giddiness: Secondary | ICD-10-CM | POA: Diagnosis not present

## 2015-12-13 DIAGNOSIS — I10 Essential (primary) hypertension: Secondary | ICD-10-CM | POA: Diagnosis not present

## 2015-12-13 DIAGNOSIS — Z23 Encounter for immunization: Secondary | ICD-10-CM | POA: Diagnosis not present

## 2015-12-15 ENCOUNTER — Emergency Department (HOSPITAL_COMMUNITY): Payer: Medicare Other

## 2015-12-15 ENCOUNTER — Encounter (HOSPITAL_COMMUNITY): Payer: Self-pay

## 2015-12-15 ENCOUNTER — Inpatient Hospital Stay (HOSPITAL_COMMUNITY)
Admission: EM | Admit: 2015-12-15 | Discharge: 2015-12-17 | DRG: 312 | Disposition: A | Discharge status: Home Health | Payer: Medicare Other | Attending: Internal Medicine | Admitting: Internal Medicine

## 2015-12-15 DIAGNOSIS — M542 Cervicalgia: Secondary | ICD-10-CM | POA: Diagnosis not present

## 2015-12-15 DIAGNOSIS — R8271 Bacteriuria: Secondary | ICD-10-CM | POA: Diagnosis present

## 2015-12-15 DIAGNOSIS — I69398 Other sequelae of cerebral infarction: Secondary | ICD-10-CM | POA: Diagnosis not present

## 2015-12-15 DIAGNOSIS — W19XXXA Unspecified fall, initial encounter: Secondary | ICD-10-CM | POA: Diagnosis present

## 2015-12-15 DIAGNOSIS — W1830XA Fall on same level, unspecified, initial encounter: Secondary | ICD-10-CM | POA: Diagnosis present

## 2015-12-15 DIAGNOSIS — M810 Age-related osteoporosis without current pathological fracture: Secondary | ICD-10-CM | POA: Diagnosis present

## 2015-12-15 DIAGNOSIS — S0993XA Unspecified injury of face, initial encounter: Secondary | ICD-10-CM | POA: Diagnosis not present

## 2015-12-15 DIAGNOSIS — I10 Essential (primary) hypertension: Secondary | ICD-10-CM | POA: Diagnosis not present

## 2015-12-15 DIAGNOSIS — F32A Depression, unspecified: Secondary | ICD-10-CM | POA: Diagnosis present

## 2015-12-15 DIAGNOSIS — S199XXA Unspecified injury of neck, initial encounter: Secondary | ICD-10-CM | POA: Diagnosis not present

## 2015-12-15 DIAGNOSIS — S022XXA Fracture of nasal bones, initial encounter for closed fracture: Secondary | ICD-10-CM | POA: Diagnosis not present

## 2015-12-15 DIAGNOSIS — I69331 Monoplegia of upper limb following cerebral infarction affecting right dominant side: Secondary | ICD-10-CM | POA: Diagnosis not present

## 2015-12-15 DIAGNOSIS — T148XXA Other injury of unspecified body region, initial encounter: Secondary | ICD-10-CM

## 2015-12-15 DIAGNOSIS — Z882 Allergy status to sulfonamides status: Secondary | ICD-10-CM

## 2015-12-15 DIAGNOSIS — M5126 Other intervertebral disc displacement, lumbar region: Secondary | ICD-10-CM | POA: Diagnosis present

## 2015-12-15 DIAGNOSIS — R55 Syncope and collapse: Principal | ICD-10-CM | POA: Diagnosis present

## 2015-12-15 DIAGNOSIS — Z79899 Other long term (current) drug therapy: Secondary | ICD-10-CM

## 2015-12-15 DIAGNOSIS — I739 Peripheral vascular disease, unspecified: Secondary | ICD-10-CM | POA: Diagnosis present

## 2015-12-15 DIAGNOSIS — G8929 Other chronic pain: Secondary | ICD-10-CM | POA: Diagnosis present

## 2015-12-15 DIAGNOSIS — R51 Headache: Secondary | ICD-10-CM | POA: Diagnosis not present

## 2015-12-15 DIAGNOSIS — Z66 Do not resuscitate: Secondary | ICD-10-CM | POA: Diagnosis present

## 2015-12-15 DIAGNOSIS — F329 Major depressive disorder, single episode, unspecified: Secondary | ICD-10-CM | POA: Diagnosis not present

## 2015-12-15 DIAGNOSIS — S0083XA Contusion of other part of head, initial encounter: Secondary | ICD-10-CM | POA: Diagnosis not present

## 2015-12-15 DIAGNOSIS — Z809 Family history of malignant neoplasm, unspecified: Secondary | ICD-10-CM

## 2015-12-15 DIAGNOSIS — Z885 Allergy status to narcotic agent status: Secondary | ICD-10-CM

## 2015-12-15 DIAGNOSIS — Y93K1 Activity, walking an animal: Secondary | ICD-10-CM

## 2015-12-15 DIAGNOSIS — S299XXA Unspecified injury of thorax, initial encounter: Secondary | ICD-10-CM | POA: Diagnosis not present

## 2015-12-15 DIAGNOSIS — S0990XA Unspecified injury of head, initial encounter: Secondary | ICD-10-CM | POA: Diagnosis not present

## 2015-12-15 DIAGNOSIS — Z7982 Long term (current) use of aspirin: Secondary | ICD-10-CM

## 2015-12-15 DIAGNOSIS — E785 Hyperlipidemia, unspecified: Secondary | ICD-10-CM | POA: Diagnosis present

## 2015-12-15 DIAGNOSIS — R42 Dizziness and giddiness: Secondary | ICD-10-CM | POA: Diagnosis present

## 2015-12-15 LAB — I-STAT TROPONIN, ED: Troponin i, poc: 0 ng/mL (ref 0.00–0.08)

## 2015-12-15 LAB — CBC WITH DIFFERENTIAL/PLATELET
BASOS ABS: 0.1 10*3/uL (ref 0.0–0.1)
Basophils Relative: 1 %
Eosinophils Absolute: 0.2 10*3/uL (ref 0.0–0.7)
Eosinophils Relative: 3 %
HEMATOCRIT: 43.9 % (ref 36.0–46.0)
HEMOGLOBIN: 14.4 g/dL (ref 12.0–15.0)
LYMPHS ABS: 2.9 10*3/uL (ref 0.7–4.0)
LYMPHS PCT: 37 %
MCH: 31.2 pg (ref 26.0–34.0)
MCHC: 32.8 g/dL (ref 30.0–36.0)
MCV: 95.2 fL (ref 78.0–100.0)
Monocytes Absolute: 0.6 10*3/uL (ref 0.1–1.0)
Monocytes Relative: 8 %
NEUTROS ABS: 3.9 10*3/uL (ref 1.7–7.7)
NEUTROS PCT: 51 %
PLATELETS: 292 10*3/uL (ref 150–400)
RBC: 4.61 MIL/uL (ref 3.87–5.11)
RDW: 13.2 % (ref 11.5–15.5)
WBC: 7.8 10*3/uL (ref 4.0–10.5)

## 2015-12-15 LAB — URINALYSIS, ROUTINE W REFLEX MICROSCOPIC
Bilirubin Urine: NEGATIVE
GLUCOSE, UA: NEGATIVE mg/dL
Ketones, ur: NEGATIVE mg/dL
Nitrite: NEGATIVE
PROTEIN: NEGATIVE mg/dL
SPECIFIC GRAVITY, URINE: 1.007 (ref 1.005–1.030)
pH: 6 (ref 5.0–8.0)

## 2015-12-15 LAB — URINE MICROSCOPIC-ADD ON

## 2015-12-15 LAB — BASIC METABOLIC PANEL
ANION GAP: 13 (ref 5–15)
BUN: 14 mg/dL (ref 6–20)
CHLORIDE: 106 mmol/L (ref 101–111)
CO2: 19 mmol/L — ABNORMAL LOW (ref 22–32)
Calcium: 9.9 mg/dL (ref 8.9–10.3)
Creatinine, Ser: 0.97 mg/dL (ref 0.44–1.00)
GFR calc Af Amer: >60 mL/min (ref >60)
GFR, EST NON AFRICAN AMERICAN: 54 mL/min — AB (ref >60)
GLUCOSE: 96 mg/dL (ref 65–99)
POTASSIUM: 4 mmol/L (ref 3.5–5.1)
Sodium: 138 mmol/L (ref 135–145)

## 2015-12-15 LAB — CK: CK TOTAL: 72 U/L (ref 38–234)

## 2015-12-15 MED ORDER — ACETAMINOPHEN 325 MG PO TABS
650.0000 mg | ORAL_TABLET | Freq: Once | ORAL | Status: AC
  Administered 2015-12-15: 650 mg via ORAL
  Filled 2015-12-15: qty 2

## 2015-12-15 MED ORDER — DEXTROSE 5 % IV SOLN
1.0000 g | Freq: Once | INTRAVENOUS | Status: AC
  Administered 2015-12-15: 1 g via INTRAVENOUS
  Filled 2015-12-15: qty 10

## 2015-12-15 MED ORDER — SODIUM CHLORIDE 0.9 % IV BOLUS (SEPSIS)
500.0000 mL | Freq: Once | INTRAVENOUS | Status: AC
  Administered 2015-12-15: 500 mL via INTRAVENOUS

## 2015-12-15 MED ORDER — ACETAMINOPHEN 325 MG PO TABS: 325.0000 mg | ORAL_TABLET | Freq: Once | ORAL | Status: DC

## 2015-12-15 NOTE — ED Triage Notes (Signed)
Pt presents with hematoma to R side of head after falling while walking her dog this morning.  Pt unsure of why she fell, reports awakening on ground.

## 2015-12-15 NOTE — H&P (Signed)
Kathy Calhoun [id number] DOB: June 18, 1933 DOA: 12/15/2015     PCP: Mathews Argyle, MD   Outpatient Specialists: [facility]   Patient coming from:   home Lives   With family    Chief Complaint: Fall  HPI: Kathy Calhoun is a 82 y.o. female with medical history significant of CVA March 2015 residual right-sided deficit., HTN, HLD    Presented with sudden fall today patient was out walking her dog when she woke up on the pavement she reports right-sided head injury. She is unsure why she fell. Reports waking waking up on the ground denies tripping. No loss of bowel or bladder. No prior episodes like that. 2-3 months ago she had vertigo but no syncope. Her fall occurred at 11:30 AM. Patient does not recall any chest pain shortness of breath presyncope prior to fall. She is unsure for how long she's been on the flow but was able to get up and walk home. No nausea vomiting associated this no abdominal pain no shortness of breath no urinary complaints. Family was made concerned regarding possibility of syncope and wanted patient to be evaluated further  denies burning with urination, no frequency no fever. Not confused Regarding pertinent Chronic problems: reguarding history of stroke she's been treated with aspirin. She has mild residual right side weakness and walks occasionally with a cane.    IN ER:  Temp (24hrs), Avg:98.1 F (36.7 C), Min:98.1 F (36.7 C), Max:98.1 F (36.7 C)    Heart rate 89 blood pressure 145/80 WBC 7.8 hemoglobin 14.4 crit 0.95  CT head remote lacunar infarct mole vessel ischemic changes Following Medications were ordered in ER: Medications  sodium chloride 0.9 % bolus 500 mL (0 mLs Intravenous Stopped 12/15/15 1954)      Hospitalist was called for admission for possible syncope evaluation  Review of Systems:    Pertinent positives include: fall, her head feels heavy.   Constitutional:  No weight loss, night sweats, Fevers, chills, fatigue,  weight loss  HEENT:  No headaches, Difficulty swallowing,Tooth/dental problems,Sore throat,  No sneezing, itching, ear ache, nasal congestion, post nasal drip,  Cardio-vascular:  No chest pain, Orthopnea, PND, anasarca, dizziness, palpitations.no Bilateral lower extremity swelling  GI:  No heartburn, indigestion, abdominal pain, nausea, vomiting, diarrhea, change in bowel habits, loss of appetite, melena, blood in stool, hematemesis Resp:  no shortness of breath at rest. No dyspnea on exertion, No excess mucus, no productive cough, No non-productive cough, No coughing up of blood.No change in color of mucus.No wheezing. Skin:  no rash or lesions. No jaundice GU:  no dysuria, change in color of urine, no urgency or frequency. No straining to urinate.  No flank pain.  Musculoskeletal:  No joint pain or no joint swelling. No decreased range of motion. No back pain.  Psych:  No change in mood or affect. No depression or anxiety. No memory loss.  Neuro: no localizing neurological complaints, no tingling, no weakness, no double vision, no gait abnormality, no slurred speech, no confusion  As per HPI otherwise 10 point review of systems negative.   Past Medical History: Past Medical History:  Diagnosis Date  . Disc    bulging lumbar disc has had steriod injections  x3  . HTN (hypertension)   . Osteoporosis   . Stroke [facility])    Past Surgical History:  Procedure Laterality Date  . APPENDECTOMY    . CATARACT EXTRACTION     bilaterally 2011  . HYSTEROTOMY    .  LAPAROTOMY     large benign mass removed 1993  . MOUTH SURGERY    . NASAL SEPTUM SURGERY    . OOPHORECTOMY    . TUBAL LIGATION       Social History:  Ambulatory independently  Or cane  She is a widow    reports that she has never smoked. She has never used smokeless tobacco. She reports that she does not drink alcohol or use drugs.  Allergies:   Allergies  Allergen Reactions  . Codeine Rash  . Sulfa Antibiotics  Rash       Family History:   Family History  Problem Relation Age of Onset  . Cancer Father   . Dementia Brother   . Dementia Sister     Medications: Prior to Admission medications   Medication Sig Start Date End Date Taking? Authorizing Provider  acetaminophen (TYLENOL) 500 MG tablet Take 1,000 mg by mouth every 6 (six) hours as needed.   Yes Historical Provider, MD  amLODipine (NORVASC) 5 MG tablet Take 5 mg by mouth daily. 11/30/15  Yes Historical Provider, MD  aspirin 325 MG tablet Take 1 tablet (325 mg total) by mouth daily. 05/21/13  Yes Ivan Anchors Love, PA-C  atorvastatin (LIPITOR) 40 MG tablet Take 40 mg by mouth daily.   Yes Historical Provider, MD  Calcium-Vitamin D (CALTRATE 600 PLUS-VIT D PO) Take 1 tablet by mouth daily.    Yes Historical Provider, MD  diclofenac sodium (VOLTAREN) 1 % GEL Apply 2 g topically 3 (three) times daily before meals. To right shoulder Patient taking differently: Apply 2 g topically 4 (four) times daily as needed (for pain). To right shoulder 05/21/13  Yes Ivan Anchors Love, PA-C  DULoxetine (CYMBALTA) 30 MG capsule TAKE (1) CAPSULE DAILY. 08/26/14  Yes Charlett Blake, MD  lisinopril (PRINIVIL,ZESTRIL) 5 MG tablet Take 5 mg by mouth daily.  08/05/13  Yes Historical Provider, MD  meclizine (ANTIVERT) 25 MG tablet Take 1 tablet (25 mg total) by mouth 3 (three) times daily as needed for dizziness. 07/12/15  Yes Fredia Sorrow, MD  Multiple Vitamins-Minerals (MULTIVITAMIN WITH MINERALS) tablet Take 1 tablet by mouth daily.   Yes Historical Provider, MD    Physical Exam: Patient Vitals for the past 24 hrs:  BP Temp Temp src Pulse Resp SpO2 Height Weight  12/15/15 2107 145/80 - - 88 - 98 % - -  12/15/15 1945 142/73 - - 79 17 98 % - -  12/15/15 1845 144/66 - - 82 18 97 % - -  12/15/15 1830 156/72 - - 81 13 99 % - -  12/15/15 1818 159/95 - - 86 16 100 % - -  12/15/15 1745 100/68 - - 80 16 100 % - -  12/15/15 1726 153/70 - - 81 15 99 % - -  12/15/15 1615  137/68 - - 78 - 100 % - -  12/15/15 1551 166/81 - - 78 18 100 % - -  12/15/15 1409 - - - - - - 4\' 11"  (1.499 m) 60.3 kg (133 lb)  12/15/15 1405 148/55 98.1 F (36.7 C) Oral 82 18 98 % - -    1. General:  in No Acute distress 2. Psychological: Alert and  Oriented 3. Head/ENT:   Moist   Mucous Membranes                          Head   traumaticInto the right side of the face, neck  supple                            Poor Dentition 4. SKIN:  decreased Skin turgor,  Skin clean Dry and intact no rash 5. Heart: Regular rate and rhythm no Murmur, Rub or gallop 6. Lungs:  Clear to auscultation bilaterally, no wheezes or crackles   7. Abdomen: Soft,  mild suprapubic tenderness, Non distended 8. Lower extremities: no clubbing, cyanosis, or edema 9.  strength 4 out of 5 on the right and 5 out of 5 on the left  extremities cranial nerves II through XII intact 10. MSK: Normal range of motion   body mass index is 26.86 kg/m.  Labs on Admission:   Labs on Admission: I have personally reviewed following labs and imaging studies  CBC:  Recent Labs Lab 12/15/15 1726  WBC 7.8  NEUTROABS 3.9  HGB 14.4  HCT 43.9  MCV 95.2  PLT 123456   Basic Metabolic Panel:  Recent Labs Lab 12/15/15 1726  NA 138  K 4.0  CL 106  CO2 19*  GLUCOSE 96  BUN 14  CREATININE 0.97  CALCIUM 9.9   GFR: Estimated Creatinine Clearance: 36.5 mL/min (by C-G formula based on SCr of 0.97 mg/dL). Liver Function Tests: No results for input(s): AST, ALT, ALKPHOS, BILITOT, PROT, ALBUMIN in the last 168 hours. No results for input(s): LIPASE, AMYLASE in the last 168 hours. No results for input(s): AMMONIA in the last 168 hours. Coagulation Profile: No results for input(s): INR, PROTIME in the last 168 hours. Cardiac Enzymes:  Recent Labs Lab 12/15/15 1726  CKTOTAL 72   BNP (last 3 results) No results for input(s): PROBNP in the last 8760 hours. HbA1C: No results for input(s): HGBA1C in the last 72  hours. CBG: No results for input(s): GLUCAP in the last 168 hours. Lipid Profile: No results for input(s): CHOL, HDL, LDLCALC, TRIG, CHOLHDL, LDLDIRECT in the last 72 hours. Thyroid Function Tests: No results for input(s): TSH, T4TOTAL, FREET4, T3FREE, THYROIDAB in the last 72 hours. Anemia Panel: No results for input(s): VITAMINB12, FOLATE, FERRITIN, TIBC, IRON, RETICCTPCT in the last 72 hours. Urine analysis:    Component Value Date/Time   COLORURINE YELLOW 12/15/2015 1730   APPEARANCEUR CLOUDY (A) 12/15/2015 1730   LABSPEC 1.007 12/15/2015 1730   PHURINE 6.0 12/15/2015 1730   GLUCOSEU NEGATIVE 12/15/2015 1730   HGBUR MODERATE (A) 12/15/2015 1730   BILIRUBINUR NEGATIVE 12/15/2015 1730   KETONESUR NEGATIVE 12/15/2015 1730   PROTEINUR NEGATIVE 12/15/2015 1730   UROBILINOGEN 0.2 05/14/2013 2303   NITRITE NEGATIVE 12/15/2015 1730   LEUKOCYTESUR LARGE (A) 12/15/2015 1730   Sepsis Labs: @LABRCNTIP (procalcitonin:4,lacticidven:4) )No results found for this or any previous visit (from the past 240 hour(s)).    UA evidence of UTI    Lab Results  Component Value Date   HGBA1C 5.8 (H) 05/13/2013    Estimated Creatinine Clearance: 36.5 mL/min (by C-G formula based on SCr of 0.97 mg/dL).  BNP (last 3 results) No results for input(s): PROBNP in the last 8760 hours.   ECG REPORT  Independently reviewed Rate: 79  Rhythm: Low-voltage but normal sinus rhythm ST&T Change: No acute ischemic changes   QTC 433  Filed Weights   12/15/15 1409  Weight: 60.3 kg (133 lb)     Cultures:    Component Value Date/Time   SDES URINE, CLEAN CATCH 05/14/2013 2303   SPECREQUEST NONE 05/14/2013 2303   CULT  05/14/2013 2303  Multiple bacterial morphotypes present, none predominant. Suggest appropriate recollection if clinically indicated. Performed at [facility] 05/16/2013 FINAL 05/14/2013 2303     Radiological Exams on Admission: Dg Chest 2 View  Result Date:  12/15/2015 CLINICAL DATA:  Fall today.  Initial encounter. EXAM: CHEST  2 VIEW COMPARISON:  05/12/2013 FINDINGS: The heart size and mediastinal contours are within normal limits. Stable scarring at both lung apices. There is no evidence of pulmonary edema, consolidation, pneumothorax, nodule or pleural fluid. No bony fractures are seen. Stable osteopenia of the spine. IMPRESSION: No active cardiopulmonary disease. Electronically Signed   By: Aletta Edouard M.D.   On: 12/15/2015 16:45   Ct Head Wo Contrast  Result Date: 12/15/2015 CLINICAL DATA:  Patient fell today while walking around the block. Loss of consciousness. Has bruising up of an under right eye. Patient states pain is in right side of head. EXAM: CT HEAD WITHOUT CONTRAST CT MAXILLOFACIAL WITHOUT CONTRAST CT CERVICAL SPINE WITHOUT CONTRAST TECHNIQUE: Multidetector CT imaging of the head, cervical spine, and maxillofacial structures were performed using the standard protocol without intravenous contrast. Multiplanar CT image reconstructions of the cervical spine and maxillofacial structures were also generated. COMPARISON:  MRI brain 07/12/2015, CT scan brain 07/12/2015, 05/12/2013. FINDINGS: CT HEAD FINDINGS Brain: There is no evidence for acute territorial infarction, acute intracranial hemorrhage, focal mass lesion, midline shift or mass effect. Extensive periventricular white matter hypodensities are again evident, consistent with small vessel ischemic disease. Interim finding of a focal CSF density within the right periventricular white matter, consistent with lacunar infarct, this appears old but is new since the prior CT scan 07/12/2015. Mild atrophy is again visualized. The ventricles are slightly enlarged but similar in size and configuration compared to the previous exam. Vascular: Calcifications are present within the internal carotid arteries at the skullbase. Bilateral vertebral artery calcifications are also present. Skull: Negative for  acute fracture or suspicious bone lesion. Mild hypoplasia of the head of the left mandible, similar compared to prior. Sinuses/Orbits: No acute finding. Other: None CT MAXILLOFACIAL FINDINGS Osseous: There is no mandibular fracture identified. Bilateral zygomatic arches and pterygoid plates appear intact. Minimally offset right nasal bone fracture identified without significant overlying soft tissue hematoma. Mastoid air cells are clear. Mild hypoplasia of the head of the left mandible as before. Orbits: Intact and without evidence for displaced fracture. Extraocular muscles are symmetric. Sinuses: No acute fluid levels visualized. No significant mucosal thickening. Soft tissues: Mild right periorbital and supraorbital soft tissue swelling. Limited intracranial: Atrophy with periventricular white matter ischemic changes. CT CERVICAL SPINE FINDINGS Alignment: Minimal anterior listhesis of C5 on C6. Bony alignment otherwise within normal limits. Skull base and vertebrae: Craniovertebral junction is intact. Mastoid air cells are clear. Partial fusion posteriorly at C3-C4. Vertebral body heights are maintained. Soft tissues and spinal canal: Prevertebral soft tissue thickness is normal. Epiglottis is thin and normal. Thyroid gland slightly heterogeneous but without focal mass. No abnormally enlarged neck lymph nodes identified. No bony impingement of the spinal canal. There are bilateral carotid artery calcifications. Disc levels: Moderate narrowing at C6-C7, mild narrowing at C3-C4 with partial fusion posteriorly. Disc space calcification present at C2-C3 and C4-C5. Hypertrophic facet changes on the right at C2-C3 and C3-C4. Bilateral facet hypertrophy at C4-C5. Mild facet disease bilaterally at C5-C6. Small posterior disc osteophyte complex is present at C5-C6 and C6-C7 without significant bony canal compromise. Upper chest: There is biapical pleural-parenchymal scarring with calcified pleural plaques present within  the bilateral  lung apices. Focal soft tissue thickening with probable bronchiectasis in the apical segment of the right upper lobe, is favored to represent parenchymal scarring. Other: None IMPRESSION: 1. No definite CT evidence for acute intracranial abnormality 2. Interval finding of focal CSF density within the right periventricular white matter consistent with remote lacunar infarct, this is new since the previous CT scan from May 2017. 3. Extensive periventricular white matter hypodensity, consistent with small vessel ischemic change. 4. Age indeterminate right nasal bone fracture, given the absence of overlying soft tissue swelling, favor old injury. Otherwise no acute facial bone fracture identified. Right periorbital soft tissue swelling. 5. No CT evidence for acute bony abnormality of the cervical spine. Multilevel degenerative disc disease as described above 6. Soft tissue thickening and pleural calcifications within the bilateral lung apices; this is favored to represent pleural parenchymal scarring. 7. Vascular calcifications Electronically Signed   By: Donavan Foil M.D.   On: 12/15/2015 18:53   Ct Cervical Spine Wo Contrast  Result Date: 12/15/2015 CLINICAL DATA:  Patient fell today while walking around the block. Loss of consciousness. Has bruising up of an under right eye. Patient states pain is in right side of head. EXAM: CT HEAD WITHOUT CONTRAST CT MAXILLOFACIAL WITHOUT CONTRAST CT CERVICAL SPINE WITHOUT CONTRAST TECHNIQUE: Multidetector CT imaging of the head, cervical spine, and maxillofacial structures were performed using the standard protocol without intravenous contrast. Multiplanar CT image reconstructions of the cervical spine and maxillofacial structures were also generated. COMPARISON:  MRI brain 07/12/2015, CT scan brain 07/12/2015, 05/12/2013. FINDINGS: CT HEAD FINDINGS Brain: There is no evidence for acute territorial infarction, acute intracranial hemorrhage, focal mass lesion,  midline shift or mass effect. Extensive periventricular white matter hypodensities are again evident, consistent with small vessel ischemic disease. Interim finding of a focal CSF density within the right periventricular white matter, consistent with lacunar infarct, this appears old but is new since the prior CT scan 07/12/2015. Mild atrophy is again visualized. The ventricles are slightly enlarged but similar in size and configuration compared to the previous exam. Vascular: Calcifications are present within the internal carotid arteries at the skullbase. Bilateral vertebral artery calcifications are also present. Skull: Negative for acute fracture or suspicious bone lesion. Mild hypoplasia of the head of the left mandible, similar compared to prior. Sinuses/Orbits: No acute finding. Other: None CT MAXILLOFACIAL FINDINGS Osseous: There is no mandibular fracture identified. Bilateral zygomatic arches and pterygoid plates appear intact. Minimally offset right nasal bone fracture identified without significant overlying soft tissue hematoma. Mastoid air cells are clear. Mild hypoplasia of the head of the left mandible as before. Orbits: Intact and without evidence for displaced fracture. Extraocular muscles are symmetric. Sinuses: No acute fluid levels visualized. No significant mucosal thickening. Soft tissues: Mild right periorbital and supraorbital soft tissue swelling. Limited intracranial: Atrophy with periventricular white matter ischemic changes. CT CERVICAL SPINE FINDINGS Alignment: Minimal anterior listhesis of C5 on C6. Bony alignment otherwise within normal limits. Skull base and vertebrae: Craniovertebral junction is intact. Mastoid air cells are clear. Partial fusion posteriorly at C3-C4. Vertebral body heights are maintained. Soft tissues and spinal canal: Prevertebral soft tissue thickness is normal. Epiglottis is thin and normal. Thyroid gland slightly heterogeneous but without focal mass. No  abnormally enlarged neck lymph nodes identified. No bony impingement of the spinal canal. There are bilateral carotid artery calcifications. Disc levels: Moderate narrowing at C6-C7, mild narrowing at C3-C4 with partial fusion posteriorly. Disc space calcification present at C2-C3 and C4-C5. Hypertrophic facet changes on  the right at C2-C3 and C3-C4. Bilateral facet hypertrophy at C4-C5. Mild facet disease bilaterally at C5-C6. Small posterior disc osteophyte complex is present at C5-C6 and C6-C7 without significant bony canal compromise. Upper chest: There is biapical pleural-parenchymal scarring with calcified pleural plaques present within the bilateral lung apices. Focal soft tissue thickening with probable bronchiectasis in the apical segment of the right upper lobe, is favored to represent parenchymal scarring. Other: None IMPRESSION: 1. No definite CT evidence for acute intracranial abnormality 2. Interval finding of focal CSF density within the right periventricular white matter consistent with remote lacunar infarct, this is new since the previous CT scan from May 2017. 3. Extensive periventricular white matter hypodensity, consistent with small vessel ischemic change. 4. Age indeterminate right nasal bone fracture, given the absence of overlying soft tissue swelling, favor old injury. Otherwise no acute facial bone fracture identified. Right periorbital soft tissue swelling. 5. No CT evidence for acute bony abnormality of the cervical spine. Multilevel degenerative disc disease as described above 6. Soft tissue thickening and pleural calcifications within the bilateral lung apices; this is favored to represent pleural parenchymal scarring. 7. Vascular calcifications Electronically Signed   By: Donavan Foil M.D.   On: 12/15/2015 18:53   Ct Maxillofacial Wo Contrast  Result Date: 12/15/2015 CLINICAL DATA:  Patient fell today while walking around the block. Loss of consciousness. Has bruising up of an  under right eye. Patient states pain is in right side of head. EXAM: CT HEAD WITHOUT CONTRAST CT MAXILLOFACIAL WITHOUT CONTRAST CT CERVICAL SPINE WITHOUT CONTRAST TECHNIQUE: Multidetector CT imaging of the head, cervical spine, and maxillofacial structures were performed using the standard protocol without intravenous contrast. Multiplanar CT image reconstructions of the cervical spine and maxillofacial structures were also generated. COMPARISON:  MRI brain 07/12/2015, CT scan brain 07/12/2015, 05/12/2013. FINDINGS: CT HEAD FINDINGS Brain: There is no evidence for acute territorial infarction, acute intracranial hemorrhage, focal mass lesion, midline shift or mass effect. Extensive periventricular white matter hypodensities are again evident, consistent with small vessel ischemic disease. Interim finding of a focal CSF density within the right periventricular white matter, consistent with lacunar infarct, this appears old but is new since the prior CT scan 07/12/2015. Mild atrophy is again visualized. The ventricles are slightly enlarged but similar in size and configuration compared to the previous exam. Vascular: Calcifications are present within the internal carotid arteries at the skullbase. Bilateral vertebral artery calcifications are also present. Skull: Negative for acute fracture or suspicious bone lesion. Mild hypoplasia of the head of the left mandible, similar compared to prior. Sinuses/Orbits: No acute finding. Other: None CT MAXILLOFACIAL FINDINGS Osseous: There is no mandibular fracture identified. Bilateral zygomatic arches and pterygoid plates appear intact. Minimally offset right nasal bone fracture identified without significant overlying soft tissue hematoma. Mastoid air cells are clear. Mild hypoplasia of the head of the left mandible as before. Orbits: Intact and without evidence for displaced fracture. Extraocular muscles are symmetric. Sinuses: No acute fluid levels visualized. No significant  mucosal thickening. Soft tissues: Mild right periorbital and supraorbital soft tissue swelling. Limited intracranial: Atrophy with periventricular white matter ischemic changes. CT CERVICAL SPINE FINDINGS Alignment: Minimal anterior listhesis of C5 on C6. Bony alignment otherwise within normal limits. Skull base and vertebrae: Craniovertebral junction is intact. Mastoid air cells are clear. Partial fusion posteriorly at C3-C4. Vertebral body heights are maintained. Soft tissues and spinal canal: Prevertebral soft tissue thickness is normal. Epiglottis is thin and normal. Thyroid gland slightly heterogeneous but without focal  mass. No abnormally enlarged neck lymph nodes identified. No bony impingement of the spinal canal. There are bilateral carotid artery calcifications. Disc levels: Moderate narrowing at C6-C7, mild narrowing at C3-C4 with partial fusion posteriorly. Disc space calcification present at C2-C3 and C4-C5. Hypertrophic facet changes on the right at C2-C3 and C3-C4. Bilateral facet hypertrophy at C4-C5. Mild facet disease bilaterally at C5-C6. Small posterior disc osteophyte complex is present at C5-C6 and C6-C7 without significant bony canal compromise. Upper chest: There is biapical pleural-parenchymal scarring with calcified pleural plaques present within the bilateral lung apices. Focal soft tissue thickening with probable bronchiectasis in the apical segment of the right upper lobe, is favored to represent parenchymal scarring. Other: None IMPRESSION: 1. No definite CT evidence for acute intracranial abnormality 2. Interval finding of focal CSF density within the right periventricular white matter consistent with remote lacunar infarct, this is new since the previous CT scan from May 2017. 3. Extensive periventricular white matter hypodensity, consistent with small vessel ischemic change. 4. Age indeterminate right nasal bone fracture, given the absence of overlying soft tissue swelling, favor old  injury. Otherwise no acute facial bone fracture identified. Right periorbital soft tissue swelling. 5. No CT evidence for acute bony abnormality of the cervical spine. Multilevel degenerative disc disease as described above 6. Soft tissue thickening and pleural calcifications within the bilateral lung apices; this is favored to represent pleural parenchymal scarring. 7. Vascular calcifications Electronically Signed   By: Donavan Foil M.D.   On: 12/15/2015 18:53    Chart has been reviewed    Assessment/Plan  82 y.o. female with medical history significant of CVA March 2015 residual right-sided deficit., HTN, HLD been admitted for possible syncope  Present on Admission: . Syncope and collapse  - Will monitor on telemetry obtain echo gram, cycle cardiac enzymes obtain carotid Dopplers and orthostatics. Given sudden onset of syncope cardiac etiology cannot be ruled out. We'll have cardiology see in the morning she may benefit from further outpatient follow-up . Bacteria in urine -patient was already started some Rocephin she denies dysuria but has mild suprapubic tenderness await results of urine culture . Fall will have PT OT evaluation . Essential hypertension, benign continue home medications holding parameters . Depression continue home medications currently stable   Other plan as per orders.  DVT prophylaxis:  SCD     Code Status:   DNR/DNI   as per patient   Family Communication:   Family not  at  Bedside   Disposition Plan:     To home once workup is complete and patient is stable                         Would benefit from PT/OT eval prior to DC  ordered                                                Consults called: emailed cardiology  Admission status:   obs   Level of care     tele           I have spent a total of 56 min on this admission  Cray Monnin 12/15/2015, 11:10 PM    [facility]  Pager [phone]   after 2 AM please page floor coverage PA If  7AM-7PM, please contact the day team taking care of  the patient  [email or web address]  Password [email or web address]

## 2015-12-15 NOTE — ED Provider Notes (Signed)
[facility] DEPT Provider Note   CSN: [id number] Arrival date & time: 12/15/15  1342     History   Chief Complaint Chief Complaint  Patient presents with  . Fall    HPI Kathy Calhoun is a 82 y.o. female.  HPI   Kathy Calhoun is a 82 y.o. female with PMH significant for chronic low back pain, HTN, CVA with residual RUE weakness, and osteoporosis who presents with fall.  Patient states she was walking her dog in the neighborhood this morning approximately 11:30 AM when she woke up on the ground.  She does not recall falling.  She denies any prodromal symptoms such as CP, SOB, dizziness, HA.  She is unsure how long she was down, but was able to get up on her own and walk home.  Now she has a bruise to the right side of her face.  Denies pain elsewhere.  Denies fever, chills, N/V/D, abdominal pain, CP, SOB, cough, or urinary symptoms.  She has been in her usual state of health.  She went to her PCP yesterday for 6 month well visit and received her flu shot.  No new or medication changes.  Tetanus is up to date.  She has been acting normally since the fall per family members. Ambulatory without assistance.  Lives at home with son.   Past Medical History:  Diagnosis Date  . Disc    bulging lumbar disc has had steriod injections  x3  . HTN (hypertension)   . Osteoporosis   . Stroke [facility])     Patient Active Problem List   Diagnosis Date Noted  . Syncope and collapse 12/15/2015  . Bacteria in urine 12/15/2015  . Fall 12/15/2015  . Ataxia, late effect of cerebrovascular disease 07/31/2013  . Dyslipidemia 05/15/2013  . Noncompliance 05/14/2013  . Essential hypertension, benign 05/13/2013  . Depression 05/13/2013  . CVA (cerebral infarction) 05/12/2013    Past Surgical History:  Procedure Laterality Date  . APPENDECTOMY    . CATARACT EXTRACTION     bilaterally 2011  . HYSTEROTOMY    . LAPAROTOMY     large benign mass removed 1993  . MOUTH SURGERY    . NASAL SEPTUM SURGERY     . OOPHORECTOMY    . TUBAL LIGATION      OB History    No data available       Home Medications    Prior to Admission medications   Medication Sig Start Date End Date Taking? Authorizing Provider  acetaminophen (TYLENOL) 500 MG tablet Take 1,000 mg by mouth every 6 (six) hours as needed.   Yes Historical Provider, MD  amLODipine (NORVASC) 5 MG tablet Take 5 mg by mouth daily. 11/30/15  Yes Historical Provider, MD  aspirin 325 MG tablet Take 1 tablet (325 mg total) by mouth daily. 05/21/13  Yes Ivan Anchors Love, PA-C  atorvastatin (LIPITOR) 40 MG tablet Take 40 mg by mouth daily.   Yes Historical Provider, MD  Calcium-Vitamin D (CALTRATE 600 PLUS-VIT D PO) Take 1 tablet by mouth daily.    Yes Historical Provider, MD  diclofenac sodium (VOLTAREN) 1 % GEL Apply 2 g topically 3 (three) times daily before meals. To right shoulder Patient taking differently: Apply 2 g topically 4 (four) times daily as needed (for pain). To right shoulder 05/21/13  Yes Ivan Anchors Love, PA-C  DULoxetine (CYMBALTA) 30 MG capsule TAKE (1) CAPSULE DAILY. 08/26/14  Yes Charlett Blake, MD  lisinopril (PRINIVIL,ZESTRIL) 5 MG tablet  Take 5 mg by mouth daily.  08/05/13  Yes Historical Provider, MD  meclizine (ANTIVERT) 25 MG tablet Take 1 tablet (25 mg total) by mouth 3 (three) times daily as needed for dizziness. 07/12/15  Yes Fredia Sorrow, MD  Multiple Vitamins-Minerals (MULTIVITAMIN WITH MINERALS) tablet Take 1 tablet by mouth daily.   Yes Historical Provider, MD    Family History Family History  Problem Relation Age of Onset  . Cancer Father   . Dementia Brother   . Dementia Sister     Social History Social History  Substance Use Topics  . Smoking status: Never Smoker  . Smokeless tobacco: Never Used  . Alcohol use No     Allergies   Codeine and Sulfa antibiotics   Review of Systems Review of Systems   Physical Exam Updated Vital Signs BP 145/80   Pulse 88   Temp 98.1 F (36.7 C) (Oral)    Resp 17   Ht 4\' 11"  (1.499 m)   Wt 60.3 kg   SpO2 98%   BMI 26.86 kg/m   Physical Exam  Constitutional: She is oriented to person, place, and time. She appears well-developed and well-nourished.  Non-toxic appearance. She does not have a sickly appearance. She does not appear ill.  HENT:  Head: Normocephalic.  Mouth/Throat: Oropharynx is clear and moist.  Golf ball sized hematoma with abrasion to right forehead and bruising about right zygoma.  No crepitus or step offs.  No signs of entrapment.   Eyes: Conjunctivae and EOM are normal.  Neck: Normal range of motion. Neck supple.  Mild cervical midline tenderness.   Cardiovascular: Normal rate, regular rhythm and normal heart sounds.   Pulmonary/Chest: Effort normal and breath sounds normal. No accessory muscle usage or stridor. No respiratory distress. She has no wheezes. She has no rhonchi. She has no rales.  Abdominal: Soft. Bowel sounds are normal. She exhibits no distension. There is no tenderness.  Musculoskeletal: Normal range of motion.  No t/l/s midline tenderness.   Lymphadenopathy:    She has no cervical adenopathy.  Neurological: She is alert and oriented to person, place, and time.  Mental Status:   AOx3.  Speech clear without dysarthria. Cranial Nerves:  I-not tested  II-PERRLA  III, IV, VI-EOMs intact  V-temporal and masseter strength intact  VII-symmetrical facial movements intact, no facial droop  VIII-hearing grossly intact bilaterally  IX, X-gag intact  XI-strength of sternomastoid and trapezius muscles 5/5  XII-tongue midline Motor:   Good muscle bulk and tone  4/5 strength in RUE (chronic 2/2 prior CVA) remaining extremities 5/5  Cerebellar--intact RAMs, finger to nose intact bilaterally.  Gait normal  No pronator drift Sensory:  Intact in upper and lower extremities   Skin: Skin is warm and dry.  No signs of trauma other than facial injuries.   Psychiatric: She has a normal mood and affect. Her  behavior is normal.     ED Treatments / Results  Labs (all labs ordered are listed, but only abnormal results are displayed) Labs Reviewed  BASIC METABOLIC PANEL - Abnormal; Notable for the following:       Result Value   CO2 19 (*)    GFR calc non Af Amer 54 (*)    All other components within normal limits  URINALYSIS, ROUTINE W REFLEX MICROSCOPIC (NOT AT [facility]) - Abnormal; Notable for the following:    APPearance CLOUDY (*)    Hgb urine dipstick MODERATE (*)    Leukocytes, UA LARGE (*)  All other components within normal limits  URINE MICROSCOPIC-ADD ON - Abnormal; Notable for the following:    Squamous Epithelial / LPF 0-5 (*)    Bacteria, UA MANY (*)    All other components within normal limits  URINE CULTURE  CBC WITH DIFFERENTIAL/PLATELET  CK  I-STAT TROPOININ, ED    EKG  EKG Interpretation None       Radiology Dg Chest 2 View  Result Date: 12/15/2015 CLINICAL DATA:  Fall today.  Initial encounter. EXAM: CHEST  2 VIEW COMPARISON:  05/12/2013 FINDINGS: The heart size and mediastinal contours are within normal limits. Stable scarring at both lung apices. There is no evidence of pulmonary edema, consolidation, pneumothorax, nodule or pleural fluid. No bony fractures are seen. Stable osteopenia of the spine. IMPRESSION: No active cardiopulmonary disease. Electronically Signed   By: Aletta Edouard M.D.   On: 12/15/2015 16:45   Ct Head Wo Contrast  Result Date: 12/15/2015 CLINICAL DATA:  Patient fell today while walking around the block. Loss of consciousness. Has bruising up of an under right eye. Patient states pain is in right side of head. EXAM: CT HEAD WITHOUT CONTRAST CT MAXILLOFACIAL WITHOUT CONTRAST CT CERVICAL SPINE WITHOUT CONTRAST TECHNIQUE: Multidetector CT imaging of the head, cervical spine, and maxillofacial structures were performed using the standard protocol without intravenous contrast. Multiplanar CT image reconstructions of the cervical spine and  maxillofacial structures were also generated. COMPARISON:  MRI brain 07/12/2015, CT scan brain 07/12/2015, 05/12/2013. FINDINGS: CT HEAD FINDINGS Brain: There is no evidence for acute territorial infarction, acute intracranial hemorrhage, focal mass lesion, midline shift or mass effect. Extensive periventricular white matter hypodensities are again evident, consistent with small vessel ischemic disease. Interim finding of a focal CSF density within the right periventricular white matter, consistent with lacunar infarct, this appears old but is new since the prior CT scan 07/12/2015. Mild atrophy is again visualized. The ventricles are slightly enlarged but similar in size and configuration compared to the previous exam. Vascular: Calcifications are present within the internal carotid arteries at the skullbase. Bilateral vertebral artery calcifications are also present. Skull: Negative for acute fracture or suspicious bone lesion. Mild hypoplasia of the head of the left mandible, similar compared to prior. Sinuses/Orbits: No acute finding. Other: None CT MAXILLOFACIAL FINDINGS Osseous: There is no mandibular fracture identified. Bilateral zygomatic arches and pterygoid plates appear intact. Minimally offset right nasal bone fracture identified without significant overlying soft tissue hematoma. Mastoid air cells are clear. Mild hypoplasia of the head of the left mandible as before. Orbits: Intact and without evidence for displaced fracture. Extraocular muscles are symmetric. Sinuses: No acute fluid levels visualized. No significant mucosal thickening. Soft tissues: Mild right periorbital and supraorbital soft tissue swelling. Limited intracranial: Atrophy with periventricular white matter ischemic changes. CT CERVICAL SPINE FINDINGS Alignment: Minimal anterior listhesis of C5 on C6. Bony alignment otherwise within normal limits. Skull base and vertebrae: Craniovertebral junction is intact. Mastoid air cells are  clear. Partial fusion posteriorly at C3-C4. Vertebral body heights are maintained. Soft tissues and spinal canal: Prevertebral soft tissue thickness is normal. Epiglottis is thin and normal. Thyroid gland slightly heterogeneous but without focal mass. No abnormally enlarged neck lymph nodes identified. No bony impingement of the spinal canal. There are bilateral carotid artery calcifications. Disc levels: Moderate narrowing at C6-C7, mild narrowing at C3-C4 with partial fusion posteriorly. Disc space calcification present at C2-C3 and C4-C5. Hypertrophic facet changes on the right at C2-C3 and C3-C4. Bilateral facet hypertrophy at C4-C5. Mild  facet disease bilaterally at C5-C6. Small posterior disc osteophyte complex is present at C5-C6 and C6-C7 without significant bony canal compromise. Upper chest: There is biapical pleural-parenchymal scarring with calcified pleural plaques present within the bilateral lung apices. Focal soft tissue thickening with probable bronchiectasis in the apical segment of the right upper lobe, is favored to represent parenchymal scarring. Other: None IMPRESSION: 1. No definite CT evidence for acute intracranial abnormality 2. Interval finding of focal CSF density within the right periventricular white matter consistent with remote lacunar infarct, this is new since the previous CT scan from May 2017. 3. Extensive periventricular white matter hypodensity, consistent with small vessel ischemic change. 4. Age indeterminate right nasal bone fracture, given the absence of overlying soft tissue swelling, favor old injury. Otherwise no acute facial bone fracture identified. Right periorbital soft tissue swelling. 5. No CT evidence for acute bony abnormality of the cervical spine. Multilevel degenerative disc disease as described above 6. Soft tissue thickening and pleural calcifications within the bilateral lung apices; this is favored to represent pleural parenchymal scarring. 7. Vascular  calcifications Electronically Signed   By: Donavan Foil M.D.   On: 12/15/2015 18:53   Ct Cervical Spine Wo Contrast  Result Date: 12/15/2015 CLINICAL DATA:  Patient fell today while walking around the block. Loss of consciousness. Has bruising up of an under right eye. Patient states pain is in right side of head. EXAM: CT HEAD WITHOUT CONTRAST CT MAXILLOFACIAL WITHOUT CONTRAST CT CERVICAL SPINE WITHOUT CONTRAST TECHNIQUE: Multidetector CT imaging of the head, cervical spine, and maxillofacial structures were performed using the standard protocol without intravenous contrast. Multiplanar CT image reconstructions of the cervical spine and maxillofacial structures were also generated. COMPARISON:  MRI brain 07/12/2015, CT scan brain 07/12/2015, 05/12/2013. FINDINGS: CT HEAD FINDINGS Brain: There is no evidence for acute territorial infarction, acute intracranial hemorrhage, focal mass lesion, midline shift or mass effect. Extensive periventricular white matter hypodensities are again evident, consistent with small vessel ischemic disease. Interim finding of a focal CSF density within the right periventricular white matter, consistent with lacunar infarct, this appears old but is new since the prior CT scan 07/12/2015. Mild atrophy is again visualized. The ventricles are slightly enlarged but similar in size and configuration compared to the previous exam. Vascular: Calcifications are present within the internal carotid arteries at the skullbase. Bilateral vertebral artery calcifications are also present. Skull: Negative for acute fracture or suspicious bone lesion. Mild hypoplasia of the head of the left mandible, similar compared to prior. Sinuses/Orbits: No acute finding. Other: None CT MAXILLOFACIAL FINDINGS Osseous: There is no mandibular fracture identified. Bilateral zygomatic arches and pterygoid plates appear intact. Minimally offset right nasal bone fracture identified without significant overlying soft  tissue hematoma. Mastoid air cells are clear. Mild hypoplasia of the head of the left mandible as before. Orbits: Intact and without evidence for displaced fracture. Extraocular muscles are symmetric. Sinuses: No acute fluid levels visualized. No significant mucosal thickening. Soft tissues: Mild right periorbital and supraorbital soft tissue swelling. Limited intracranial: Atrophy with periventricular white matter ischemic changes. CT CERVICAL SPINE FINDINGS Alignment: Minimal anterior listhesis of C5 on C6. Bony alignment otherwise within normal limits. Skull base and vertebrae: Craniovertebral junction is intact. Mastoid air cells are clear. Partial fusion posteriorly at C3-C4. Vertebral body heights are maintained. Soft tissues and spinal canal: Prevertebral soft tissue thickness is normal. Epiglottis is thin and normal. Thyroid gland slightly heterogeneous but without focal mass. No abnormally enlarged neck lymph nodes identified. No bony impingement  of the spinal canal. There are bilateral carotid artery calcifications. Disc levels: Moderate narrowing at C6-C7, mild narrowing at C3-C4 with partial fusion posteriorly. Disc space calcification present at C2-C3 and C4-C5. Hypertrophic facet changes on the right at C2-C3 and C3-C4. Bilateral facet hypertrophy at C4-C5. Mild facet disease bilaterally at C5-C6. Small posterior disc osteophyte complex is present at C5-C6 and C6-C7 without significant bony canal compromise. Upper chest: There is biapical pleural-parenchymal scarring with calcified pleural plaques present within the bilateral lung apices. Focal soft tissue thickening with probable bronchiectasis in the apical segment of the right upper lobe, is favored to represent parenchymal scarring. Other: None IMPRESSION: 1. No definite CT evidence for acute intracranial abnormality 2. Interval finding of focal CSF density within the right periventricular white matter consistent with remote lacunar infarct, this  is new since the previous CT scan from May 2017. 3. Extensive periventricular white matter hypodensity, consistent with small vessel ischemic change. 4. Age indeterminate right nasal bone fracture, given the absence of overlying soft tissue swelling, favor old injury. Otherwise no acute facial bone fracture identified. Right periorbital soft tissue swelling. 5. No CT evidence for acute bony abnormality of the cervical spine. Multilevel degenerative disc disease as described above 6. Soft tissue thickening and pleural calcifications within the bilateral lung apices; this is favored to represent pleural parenchymal scarring. 7. Vascular calcifications Electronically Signed   By: Donavan Foil M.D.   On: 12/15/2015 18:53   Ct Maxillofacial Wo Contrast  Result Date: 12/15/2015 CLINICAL DATA:  Patient fell today while walking around the block. Loss of consciousness. Has bruising up of an under right eye. Patient states pain is in right side of head. EXAM: CT HEAD WITHOUT CONTRAST CT MAXILLOFACIAL WITHOUT CONTRAST CT CERVICAL SPINE WITHOUT CONTRAST TECHNIQUE: Multidetector CT imaging of the head, cervical spine, and maxillofacial structures were performed using the standard protocol without intravenous contrast. Multiplanar CT image reconstructions of the cervical spine and maxillofacial structures were also generated. COMPARISON:  MRI brain 07/12/2015, CT scan brain 07/12/2015, 05/12/2013. FINDINGS: CT HEAD FINDINGS Brain: There is no evidence for acute territorial infarction, acute intracranial hemorrhage, focal mass lesion, midline shift or mass effect. Extensive periventricular white matter hypodensities are again evident, consistent with small vessel ischemic disease. Interim finding of a focal CSF density within the right periventricular white matter, consistent with lacunar infarct, this appears old but is new since the prior CT scan 07/12/2015. Mild atrophy is again visualized. The ventricles are slightly  enlarged but similar in size and configuration compared to the previous exam. Vascular: Calcifications are present within the internal carotid arteries at the skullbase. Bilateral vertebral artery calcifications are also present. Skull: Negative for acute fracture or suspicious bone lesion. Mild hypoplasia of the head of the left mandible, similar compared to prior. Sinuses/Orbits: No acute finding. Other: None CT MAXILLOFACIAL FINDINGS Osseous: There is no mandibular fracture identified. Bilateral zygomatic arches and pterygoid plates appear intact. Minimally offset right nasal bone fracture identified without significant overlying soft tissue hematoma. Mastoid air cells are clear. Mild hypoplasia of the head of the left mandible as before. Orbits: Intact and without evidence for displaced fracture. Extraocular muscles are symmetric. Sinuses: No acute fluid levels visualized. No significant mucosal thickening. Soft tissues: Mild right periorbital and supraorbital soft tissue swelling. Limited intracranial: Atrophy with periventricular white matter ischemic changes. CT CERVICAL SPINE FINDINGS Alignment: Minimal anterior listhesis of C5 on C6. Bony alignment otherwise within normal limits. Skull base and vertebrae: Craniovertebral junction is intact. Mastoid air  cells are clear. Partial fusion posteriorly at C3-C4. Vertebral body heights are maintained. Soft tissues and spinal canal: Prevertebral soft tissue thickness is normal. Epiglottis is thin and normal. Thyroid gland slightly heterogeneous but without focal mass. No abnormally enlarged neck lymph nodes identified. No bony impingement of the spinal canal. There are bilateral carotid artery calcifications. Disc levels: Moderate narrowing at C6-C7, mild narrowing at C3-C4 with partial fusion posteriorly. Disc space calcification present at C2-C3 and C4-C5. Hypertrophic facet changes on the right at C2-C3 and C3-C4. Bilateral facet hypertrophy at C4-C5. Mild facet  disease bilaterally at C5-C6. Small posterior disc osteophyte complex is present at C5-C6 and C6-C7 without significant bony canal compromise. Upper chest: There is biapical pleural-parenchymal scarring with calcified pleural plaques present within the bilateral lung apices. Focal soft tissue thickening with probable bronchiectasis in the apical segment of the right upper lobe, is favored to represent parenchymal scarring. Other: None IMPRESSION: 1. No definite CT evidence for acute intracranial abnormality 2. Interval finding of focal CSF density within the right periventricular white matter consistent with remote lacunar infarct, this is new since the previous CT scan from May 2017. 3. Extensive periventricular white matter hypodensity, consistent with small vessel ischemic change. 4. Age indeterminate right nasal bone fracture, given the absence of overlying soft tissue swelling, favor old injury. Otherwise no acute facial bone fracture identified. Right periorbital soft tissue swelling. 5. No CT evidence for acute bony abnormality of the cervical spine. Multilevel degenerative disc disease as described above 6. Soft tissue thickening and pleural calcifications within the bilateral lung apices; this is favored to represent pleural parenchymal scarring. 7. Vascular calcifications Electronically Signed   By: Donavan Foil M.D.   On: 12/15/2015 18:53    Procedures Procedures (including critical care time)  Medications Ordered in ED Medications  cefTRIAXone (ROCEPHIN) 1 g in dextrose 5 % 50 mL IVPB (not administered)  sodium chloride 0.9 % bolus 500 mL (0 mLs Intravenous Stopped 12/15/15 1954)     Initial Impression / Assessment and Plan / ED Course  I have reviewed the triage vital signs and the nursing notes.  Pertinent labs & imaging results that were available during my care of the patient were reviewed by me and considered in my medical decision making (see chart for details).  Clinical Course     8y old female presents with fall.  Unknown, syncopal episode?  Denies prodromal symptoms.  VSS, NAD.  Otherwise, normal state of health.  Will obtain labs, imaging, EKG, and orthostatics. Will clean abrasion.  Labs without acute abnormalities.  EKG without ischemia.  Imaging unremarkable.  UA appears infectious, urine culture sent.  Will start 1g Rocephin in ED.  Given possible syncopal episode due to arrhythmia plan to admit to medicine for observation.  Case has been discussed with and seen by Dr. Billy Fischer who agrees with the above plan for admission.    Final Clinical Impressions(s) / ED Diagnoses   Final diagnoses:  Fall, initial encounter  Contusion of face, initial encounter  Abrasion    New Prescriptions New Prescriptions   No medications on file     Gloriann Loan, PA-C 12/15/15 [place], MD 12/26/15 1450

## 2015-12-15 NOTE — ED Notes (Signed)
Spoke to PA about possible pain medication for pt.  Pt/family requesting

## 2015-12-16 ENCOUNTER — Observation Stay (HOSPITAL_COMMUNITY): Payer: Medicare Other

## 2015-12-16 DIAGNOSIS — R8271 Bacteriuria: Secondary | ICD-10-CM

## 2015-12-16 DIAGNOSIS — S022XXA Fracture of nasal bones, initial encounter for closed fracture: Secondary | ICD-10-CM | POA: Diagnosis present

## 2015-12-16 DIAGNOSIS — Z66 Do not resuscitate: Secondary | ICD-10-CM | POA: Diagnosis present

## 2015-12-16 DIAGNOSIS — E785 Hyperlipidemia, unspecified: Secondary | ICD-10-CM | POA: Diagnosis present

## 2015-12-16 DIAGNOSIS — M5126 Other intervertebral disc displacement, lumbar region: Secondary | ICD-10-CM | POA: Diagnosis present

## 2015-12-16 DIAGNOSIS — Z79899 Other long term (current) drug therapy: Secondary | ICD-10-CM | POA: Diagnosis not present

## 2015-12-16 DIAGNOSIS — I69398 Other sequelae of cerebral infarction: Secondary | ICD-10-CM | POA: Diagnosis not present

## 2015-12-16 DIAGNOSIS — S0083XA Contusion of other part of head, initial encounter: Secondary | ICD-10-CM | POA: Diagnosis present

## 2015-12-16 DIAGNOSIS — R55 Syncope and collapse: Secondary | ICD-10-CM

## 2015-12-16 DIAGNOSIS — F329 Major depressive disorder, single episode, unspecified: Secondary | ICD-10-CM

## 2015-12-16 DIAGNOSIS — I69331 Monoplegia of upper limb following cerebral infarction affecting right dominant side: Secondary | ICD-10-CM | POA: Diagnosis not present

## 2015-12-16 DIAGNOSIS — W1830XA Fall on same level, unspecified, initial encounter: Secondary | ICD-10-CM | POA: Diagnosis present

## 2015-12-16 DIAGNOSIS — M810 Age-related osteoporosis without current pathological fracture: Secondary | ICD-10-CM | POA: Diagnosis present

## 2015-12-16 DIAGNOSIS — G8929 Other chronic pain: Secondary | ICD-10-CM | POA: Diagnosis present

## 2015-12-16 DIAGNOSIS — W19XXXA Unspecified fall, initial encounter: Secondary | ICD-10-CM | POA: Diagnosis not present

## 2015-12-16 DIAGNOSIS — Z882 Allergy status to sulfonamides status: Secondary | ICD-10-CM | POA: Diagnosis not present

## 2015-12-16 DIAGNOSIS — Y93K1 Activity, walking an animal: Secondary | ICD-10-CM | POA: Diagnosis not present

## 2015-12-16 DIAGNOSIS — I10 Essential (primary) hypertension: Secondary | ICD-10-CM | POA: Diagnosis not present

## 2015-12-16 DIAGNOSIS — Z809 Family history of malignant neoplasm, unspecified: Secondary | ICD-10-CM | POA: Diagnosis not present

## 2015-12-16 DIAGNOSIS — I739 Peripheral vascular disease, unspecified: Secondary | ICD-10-CM | POA: Diagnosis present

## 2015-12-16 DIAGNOSIS — R42 Dizziness and giddiness: Secondary | ICD-10-CM | POA: Diagnosis present

## 2015-12-16 DIAGNOSIS — Z885 Allergy status to narcotic agent status: Secondary | ICD-10-CM | POA: Diagnosis not present

## 2015-12-16 DIAGNOSIS — Z7982 Long term (current) use of aspirin: Secondary | ICD-10-CM | POA: Diagnosis not present

## 2015-12-16 LAB — TROPONIN I: Troponin I: <0.03 ng/mL (ref <0.03)

## 2015-12-16 LAB — GLUCOSE, CAPILLARY: Glucose-Capillary: 90 mg/dL (ref 65–99)

## 2015-12-16 MED ORDER — AMLODIPINE BESYLATE 5 MG PO TABS
5.0000 mg | ORAL_TABLET | Freq: Every day | ORAL | Status: DC
  Administered 2015-12-16 – 2015-12-17 (×2): 5 mg via ORAL
  Filled 2015-12-16 (×2): qty 1

## 2015-12-16 MED ORDER — ACETAMINOPHEN 325 MG PO TABS
650.0000 mg | ORAL_TABLET | Freq: Four times a day (QID) | ORAL | Status: DC | PRN
  Administered 2015-12-17: 650 mg via ORAL
  Filled 2015-12-16 (×2): qty 2

## 2015-12-16 MED ORDER — SODIUM CHLORIDE 0.9% FLUSH
3.0000 mL | Freq: Two times a day (BID) | INTRAVENOUS | Status: DC
  Administered 2015-12-16 – 2015-12-17 (×3): 3 mL via INTRAVENOUS

## 2015-12-16 MED ORDER — ASPIRIN 325 MG PO TABS
325.0000 mg | ORAL_TABLET | Freq: Every day | ORAL | Status: DC
  Administered 2015-12-16 – 2015-12-17 (×2): 325 mg via ORAL
  Filled 2015-12-16 (×2): qty 1

## 2015-12-16 MED ORDER — LISINOPRIL 5 MG PO TABS
5.0000 mg | ORAL_TABLET | Freq: Every day | ORAL | Status: DC
  Administered 2015-12-16 – 2015-12-17 (×2): 5 mg via ORAL
  Filled 2015-12-16 (×2): qty 1

## 2015-12-16 MED ORDER — DULOXETINE HCL 30 MG PO CPEP
30.0000 mg | ORAL_CAPSULE | Freq: Every day | ORAL | Status: DC
Start: 2015-12-16 — End: 2015-12-17
  Administered 2015-12-16 – 2015-12-17 (×2): 30 mg via ORAL
  Filled 2015-12-16 (×2): qty 1

## 2015-12-16 MED ORDER — DEXTROSE 5 % IV SOLN
1.0000 g | Freq: Every day | INTRAVENOUS | Status: DC
  Administered 2015-12-16: 1 g via INTRAVENOUS
  Filled 2015-12-16 (×2): qty 10

## 2015-12-16 MED ORDER — ATORVASTATIN CALCIUM 40 MG PO TABS
40.0000 mg | ORAL_TABLET | Freq: Every day | ORAL | Status: DC
  Administered 2015-12-16 – 2015-12-17 (×2): 40 mg via ORAL
  Filled 2015-12-16 (×2): qty 1

## 2015-12-16 MED ORDER — SODIUM CHLORIDE 0.9 % IV SOLN
INTRAVENOUS | Status: AC
  Administered 2015-12-16: 02:00:00 via INTRAVENOUS

## 2015-12-16 NOTE — Progress Notes (Addendum)
PROGRESS NOTE    Kathy Calhoun  [id number] DOB: October 25, 1933 DOA: 12/15/2015 PCP: Mathews Argyle, MD   Chief Complaint  Patient presents with  . Fall    Brief Narrative:  HPI on 12/15/2015 by Dr. Althia Forts Kathy Calhoun is a 82 y.o. female with medical history significant of CVA March 2015 residual right-sided deficit., HTN, HLD Presented with sudden fall today patient was out walking her dog when she woke up on the pavement she reports right-sided head injury. She is unsure why she fell. Reports waking waking up on the ground denies tripping. No loss of bowel or bladder. No prior episodes like that. 2-3 months ago she had vertigo but no syncope. Her fall occurred at 11:30 AM. Patient does not recall any chest pain shortness of breath presyncope prior to fall. She is unsure for how long she's been on the flow but was able to get up and walk home. No nausea vomiting associated this no abdominal pain no shortness of breath no urinary complaints. Family was made concerned regarding possibility of syncope and wanted patient to be evaluated further  denies burning with urination, no frequency no fever. Not confused Regarding pertinent Chronic problems: reguarding history of stroke she's been treated with aspirin. She has mild residual right side weakness and walks occasionally with a cane.  Assessment & Plan   Syncope and collapse -CT head showed no acute intracranial abnormality -Carotid Doppler: No evidence of significant stenosis in bilateral carotid arteries. 139%. Vertebral artery demonstrated antegrade flow -Orthostatics vitals unremarkable  -Pending echocardiogram -Cardiology consulted and appreciated -Troponins cycled and negative  Asymptomatic bacteriuria -UA showed many bacteria, 6-30 WBC, large leukocytes -Urine culture pending -Patient denies any dysuria however did have some suprapubic tenderness upon admission (as per H&P) -Currently on ceftriaxone  Fall -PT  and OT consulted and pending  Essential hypertension -Continue amlodipine, lisinopril  Depression -Continue Cymbalta  Nasal bone fracture -Noted on CT, age-indeterminate right nasal bone fracture, favor old injury  History of CVA -Continue aspirin and statin  DVT Prophylaxis  SCDs  Code Status: DNR  Family Communication: None at bedside  Disposition Plan: In observation. Pending echocardiogram and cardiology/PT/OT.  Consultants Cardiology  Procedures  Carotid doppler  Antibiotics   Anti-infectives    Start     Dose/Rate Route Frequency Ordered Stop   12/16/15 2200  cefTRIAXone (ROCEPHIN) 1 g in dextrose 5 % 50 mL IVPB     1 g 100 mL/hr over 30 Minutes Intravenous Daily at bedtime 12/16/15 0129     12/15/15 2230  cefTRIAXone (ROCEPHIN) 1 g in dextrose 5 % 50 mL IVPB     1 g 100 mL/hr over 30 Minutes Intravenous  Once 12/15/15 2224 12/15/15 2322      Subjective:   Kathy Calhoun seen and examined today.  Patient does complain of headache on the right side, throbbing. Denies any chest pain, shortness breath, abdominal pain, nausea or vomiting, diarrhea or constipation. Does endorse having dizziness and vertigo however none prior to her falling and collapsing yesterday.  Objective:   Vitals:   12/16/15 0014 12/16/15 0540 12/16/15 0546 12/16/15 0758  BP: (!) 151/63   (!) 136/58  Pulse: 71   79  Resp: 16  16 18   Temp: 97.7 F (36.5 C)  97.8 F (36.6 C) 98 F (36.7 C)  TempSrc: Oral  Oral Oral  SpO2: 97%  98% 98%  Weight: 60.9 kg (134 lb 3.2 oz) 60.6 kg (133 lb 11.2 oz)  Height: 4\' 11"  (1.499 m)       Intake/Output Summary (Last 24 hours) at 12/16/15 1235 Last data filed at 12/16/15 0956  Gross per 24 hour  Intake              895 ml  Output              900 ml  Net               -5 ml   Filed Weights   12/15/15 1409 12/16/15 0014 12/16/15 0540  Weight: 60.3 kg (133 lb) 60.9 kg (134 lb 3.2 oz) 60.6 kg (133 lb 11.2 oz)    Exam  General: Well  developed, well nourished, NAD, appears stated age  71: [place], right orbital and frontal ecchymosis, mucous membranes moist.   Neck: Supple, no JVD, no masses  Cardiovascular: S1 S2 auscultated, no rubs, murmurs or gallops. Regular rate and rhythm.  Respiratory: Clear to auscultation bilaterally with equal chest rise  Abdomen: Soft, nontender, nondistended, + bowel sounds  Extremities: warm dry without cyanosis clubbing or edema  Neuro: AAOx3, cranial nerves grossly intact. Strength 5/5 in patient's upper and lower extremities bilaterally  Skin: Without rashes exudates or nodules  Psych: Normal affect and demeanor with intact judgement and insight   Data Reviewed: I have personally reviewed following labs and imaging studies  CBC:  Recent Labs Lab 12/15/15 1726  WBC 7.8  NEUTROABS 3.9  HGB 14.4  HCT 43.9  MCV 95.2  PLT 123456   Basic Metabolic Panel:  Recent Labs Lab 12/15/15 1726  NA 138  K 4.0  CL 106  CO2 19*  GLUCOSE 96  BUN 14  CREATININE 0.97  CALCIUM 9.9   GFR: Estimated Creatinine Clearance: 36.7 mL/min (by C-G formula based on SCr of 0.97 mg/dL). Liver Function Tests: No results for input(s): AST, ALT, ALKPHOS, BILITOT, PROT, ALBUMIN in the last 168 hours. No results for input(s): LIPASE, AMYLASE in the last 168 hours. No results for input(s): AMMONIA in the last 168 hours. Coagulation Profile: No results for input(s): INR, PROTIME in the last 168 hours. Cardiac Enzymes:  Recent Labs Lab 12/15/15 1726 12/16/15 0151 12/16/15 0819  CKTOTAL 72  --   --   TROPONINI  --  <0.03 <0.03   BNP (last 3 results) No results for input(s): PROBNP in the last 8760 hours. HbA1C: No results for input(s): HGBA1C in the last 72 hours. CBG:  Recent Labs Lab 12/16/15 0530  GLUCAP 90   Lipid Profile: No results for input(s): CHOL, HDL, LDLCALC, TRIG, CHOLHDL, LDLDIRECT in the last 72 hours. Thyroid Function Tests: No results for input(s): TSH, T4TOTAL,  FREET4, T3FREE, THYROIDAB in the last 72 hours. Anemia Panel: No results for input(s): VITAMINB12, FOLATE, FERRITIN, TIBC, IRON, RETICCTPCT in the last 72 hours. Urine analysis:    Component Value Date/Time   COLORURINE YELLOW 12/15/2015 1730   APPEARANCEUR CLOUDY (A) 12/15/2015 1730   LABSPEC 1.007 12/15/2015 1730   PHURINE 6.0 12/15/2015 1730   GLUCOSEU NEGATIVE 12/15/2015 1730   HGBUR MODERATE (A) 12/15/2015 1730   BILIRUBINUR NEGATIVE 12/15/2015 1730   KETONESUR NEGATIVE 12/15/2015 1730   PROTEINUR NEGATIVE 12/15/2015 1730   UROBILINOGEN 0.2 05/14/2013 2303   NITRITE NEGATIVE 12/15/2015 1730   LEUKOCYTESUR LARGE (A) 12/15/2015 1730   Sepsis Labs: @LABRCNTIP (procalcitonin:4,lacticidven:4)  )No results found for this or any previous visit (from the past 240 hour(s)).    Radiology Studies: Dg Chest 2 View  Result Date: 12/15/2015 CLINICAL  DATA:  Fall today.  Initial encounter. EXAM: CHEST  2 VIEW COMPARISON:  05/12/2013 FINDINGS: The heart size and mediastinal contours are within normal limits. Stable scarring at both lung apices. There is no evidence of pulmonary edema, consolidation, pneumothorax, nodule or pleural fluid. No bony fractures are seen. Stable osteopenia of the spine. IMPRESSION: No active cardiopulmonary disease. Electronically Signed   By: Aletta Edouard M.D.   On: 12/15/2015 16:45   Ct Head Wo Contrast  Result Date: 12/15/2015 CLINICAL DATA:  Patient fell today while walking around the block. Loss of consciousness. Has bruising up of an under right eye. Patient states pain is in right side of head. EXAM: CT HEAD WITHOUT CONTRAST CT MAXILLOFACIAL WITHOUT CONTRAST CT CERVICAL SPINE WITHOUT CONTRAST TECHNIQUE: Multidetector CT imaging of the head, cervical spine, and maxillofacial structures were performed using the standard protocol without intravenous contrast. Multiplanar CT image reconstructions of the cervical spine and maxillofacial structures were also  generated. COMPARISON:  MRI brain 07/12/2015, CT scan brain 07/12/2015, 05/12/2013. FINDINGS: CT HEAD FINDINGS Brain: There is no evidence for acute territorial infarction, acute intracranial hemorrhage, focal mass lesion, midline shift or mass effect. Extensive periventricular white matter hypodensities are again evident, consistent with small vessel ischemic disease. Interim finding of a focal CSF density within the right periventricular white matter, consistent with lacunar infarct, this appears old but is new since the prior CT scan 07/12/2015. Mild atrophy is again visualized. The ventricles are slightly enlarged but similar in size and configuration compared to the previous exam. Vascular: Calcifications are present within the internal carotid arteries at the skullbase. Bilateral vertebral artery calcifications are also present. Skull: Negative for acute fracture or suspicious bone lesion. Mild hypoplasia of the head of the left mandible, similar compared to prior. Sinuses/Orbits: No acute finding. Other: None CT MAXILLOFACIAL FINDINGS Osseous: There is no mandibular fracture identified. Bilateral zygomatic arches and pterygoid plates appear intact. Minimally offset right nasal bone fracture identified without significant overlying soft tissue hematoma. Mastoid air cells are clear. Mild hypoplasia of the head of the left mandible as before. Orbits: Intact and without evidence for displaced fracture. Extraocular muscles are symmetric. Sinuses: No acute fluid levels visualized. No significant mucosal thickening. Soft tissues: Mild right periorbital and supraorbital soft tissue swelling. Limited intracranial: Atrophy with periventricular white matter ischemic changes. CT CERVICAL SPINE FINDINGS Alignment: Minimal anterior listhesis of C5 on C6. Bony alignment otherwise within normal limits. Skull base and vertebrae: Craniovertebral junction is intact. Mastoid air cells are clear. Partial fusion posteriorly at  C3-C4. Vertebral body heights are maintained. Soft tissues and spinal canal: Prevertebral soft tissue thickness is normal. Epiglottis is thin and normal. Thyroid gland slightly heterogeneous but without focal mass. No abnormally enlarged neck lymph nodes identified. No bony impingement of the spinal canal. There are bilateral carotid artery calcifications. Disc levels: Moderate narrowing at C6-C7, mild narrowing at C3-C4 with partial fusion posteriorly. Disc space calcification present at C2-C3 and C4-C5. Hypertrophic facet changes on the right at C2-C3 and C3-C4. Bilateral facet hypertrophy at C4-C5. Mild facet disease bilaterally at C5-C6. Small posterior disc osteophyte complex is present at C5-C6 and C6-C7 without significant bony canal compromise. Upper chest: There is biapical pleural-parenchymal scarring with calcified pleural plaques present within the bilateral lung apices. Focal soft tissue thickening with probable bronchiectasis in the apical segment of the right upper lobe, is favored to represent parenchymal scarring. Other: None IMPRESSION: 1. No definite CT evidence for acute intracranial abnormality 2. Interval finding of focal CSF  density within the right periventricular white matter consistent with remote lacunar infarct, this is new since the previous CT scan from May 2017. 3. Extensive periventricular white matter hypodensity, consistent with small vessel ischemic change. 4. Age indeterminate right nasal bone fracture, given the absence of overlying soft tissue swelling, favor old injury. Otherwise no acute facial bone fracture identified. Right periorbital soft tissue swelling. 5. No CT evidence for acute bony abnormality of the cervical spine. Multilevel degenerative disc disease as described above 6. Soft tissue thickening and pleural calcifications within the bilateral lung apices; this is favored to represent pleural parenchymal scarring. 7. Vascular calcifications Electronically Signed    By: Donavan Foil M.D.   On: 12/15/2015 18:53   Ct Cervical Spine Wo Contrast  Result Date: 12/15/2015 CLINICAL DATA:  Patient fell today while walking around the block. Loss of consciousness. Has bruising up of an under right eye. Patient states pain is in right side of head. EXAM: CT HEAD WITHOUT CONTRAST CT MAXILLOFACIAL WITHOUT CONTRAST CT CERVICAL SPINE WITHOUT CONTRAST TECHNIQUE: Multidetector CT imaging of the head, cervical spine, and maxillofacial structures were performed using the standard protocol without intravenous contrast. Multiplanar CT image reconstructions of the cervical spine and maxillofacial structures were also generated. COMPARISON:  MRI brain 07/12/2015, CT scan brain 07/12/2015, 05/12/2013. FINDINGS: CT HEAD FINDINGS Brain: There is no evidence for acute territorial infarction, acute intracranial hemorrhage, focal mass lesion, midline shift or mass effect. Extensive periventricular white matter hypodensities are again evident, consistent with small vessel ischemic disease. Interim finding of a focal CSF density within the right periventricular white matter, consistent with lacunar infarct, this appears old but is new since the prior CT scan 07/12/2015. Mild atrophy is again visualized. The ventricles are slightly enlarged but similar in size and configuration compared to the previous exam. Vascular: Calcifications are present within the internal carotid arteries at the skullbase. Bilateral vertebral artery calcifications are also present. Skull: Negative for acute fracture or suspicious bone lesion. Mild hypoplasia of the head of the left mandible, similar compared to prior. Sinuses/Orbits: No acute finding. Other: None CT MAXILLOFACIAL FINDINGS Osseous: There is no mandibular fracture identified. Bilateral zygomatic arches and pterygoid plates appear intact. Minimally offset right nasal bone fracture identified without significant overlying soft tissue hematoma. Mastoid air cells are  clear. Mild hypoplasia of the head of the left mandible as before. Orbits: Intact and without evidence for displaced fracture. Extraocular muscles are symmetric. Sinuses: No acute fluid levels visualized. No significant mucosal thickening. Soft tissues: Mild right periorbital and supraorbital soft tissue swelling. Limited intracranial: Atrophy with periventricular white matter ischemic changes. CT CERVICAL SPINE FINDINGS Alignment: Minimal anterior listhesis of C5 on C6. Bony alignment otherwise within normal limits. Skull base and vertebrae: Craniovertebral junction is intact. Mastoid air cells are clear. Partial fusion posteriorly at C3-C4. Vertebral body heights are maintained. Soft tissues and spinal canal: Prevertebral soft tissue thickness is normal. Epiglottis is thin and normal. Thyroid gland slightly heterogeneous but without focal mass. No abnormally enlarged neck lymph nodes identified. No bony impingement of the spinal canal. There are bilateral carotid artery calcifications. Disc levels: Moderate narrowing at C6-C7, mild narrowing at C3-C4 with partial fusion posteriorly. Disc space calcification present at C2-C3 and C4-C5. Hypertrophic facet changes on the right at C2-C3 and C3-C4. Bilateral facet hypertrophy at C4-C5. Mild facet disease bilaterally at C5-C6. Small posterior disc osteophyte complex is present at C5-C6 and C6-C7 without significant bony canal compromise. Upper chest: There is biapical pleural-parenchymal scarring with calcified  pleural plaques present within the bilateral lung apices. Focal soft tissue thickening with probable bronchiectasis in the apical segment of the right upper lobe, is favored to represent parenchymal scarring. Other: None IMPRESSION: 1. No definite CT evidence for acute intracranial abnormality 2. Interval finding of focal CSF density within the right periventricular white matter consistent with remote lacunar infarct, this is new since the previous CT scan from  May 2017. 3. Extensive periventricular white matter hypodensity, consistent with small vessel ischemic change. 4. Age indeterminate right nasal bone fracture, given the absence of overlying soft tissue swelling, favor old injury. Otherwise no acute facial bone fracture identified. Right periorbital soft tissue swelling. 5. No CT evidence for acute bony abnormality of the cervical spine. Multilevel degenerative disc disease as described above 6. Soft tissue thickening and pleural calcifications within the bilateral lung apices; this is favored to represent pleural parenchymal scarring. 7. Vascular calcifications Electronically Signed   By: Donavan Foil M.D.   On: 12/15/2015 18:53   Ct Maxillofacial Wo Contrast  Result Date: 12/15/2015 CLINICAL DATA:  Patient fell today while walking around the block. Loss of consciousness. Has bruising up of an under right eye. Patient states pain is in right side of head. EXAM: CT HEAD WITHOUT CONTRAST CT MAXILLOFACIAL WITHOUT CONTRAST CT CERVICAL SPINE WITHOUT CONTRAST TECHNIQUE: Multidetector CT imaging of the head, cervical spine, and maxillofacial structures were performed using the standard protocol without intravenous contrast. Multiplanar CT image reconstructions of the cervical spine and maxillofacial structures were also generated. COMPARISON:  MRI brain 07/12/2015, CT scan brain 07/12/2015, 05/12/2013. FINDINGS: CT HEAD FINDINGS Brain: There is no evidence for acute territorial infarction, acute intracranial hemorrhage, focal mass lesion, midline shift or mass effect. Extensive periventricular white matter hypodensities are again evident, consistent with small vessel ischemic disease. Interim finding of a focal CSF density within the right periventricular white matter, consistent with lacunar infarct, this appears old but is new since the prior CT scan 07/12/2015. Mild atrophy is again visualized. The ventricles are slightly enlarged but similar in size and  configuration compared to the previous exam. Vascular: Calcifications are present within the internal carotid arteries at the skullbase. Bilateral vertebral artery calcifications are also present. Skull: Negative for acute fracture or suspicious bone lesion. Mild hypoplasia of the head of the left mandible, similar compared to prior. Sinuses/Orbits: No acute finding. Other: None CT MAXILLOFACIAL FINDINGS Osseous: There is no mandibular fracture identified. Bilateral zygomatic arches and pterygoid plates appear intact. Minimally offset right nasal bone fracture identified without significant overlying soft tissue hematoma. Mastoid air cells are clear. Mild hypoplasia of the head of the left mandible as before. Orbits: Intact and without evidence for displaced fracture. Extraocular muscles are symmetric. Sinuses: No acute fluid levels visualized. No significant mucosal thickening. Soft tissues: Mild right periorbital and supraorbital soft tissue swelling. Limited intracranial: Atrophy with periventricular white matter ischemic changes. CT CERVICAL SPINE FINDINGS Alignment: Minimal anterior listhesis of C5 on C6. Bony alignment otherwise within normal limits. Skull base and vertebrae: Craniovertebral junction is intact. Mastoid air cells are clear. Partial fusion posteriorly at C3-C4. Vertebral body heights are maintained. Soft tissues and spinal canal: Prevertebral soft tissue thickness is normal. Epiglottis is thin and normal. Thyroid gland slightly heterogeneous but without focal mass. No abnormally enlarged neck lymph nodes identified. No bony impingement of the spinal canal. There are bilateral carotid artery calcifications. Disc levels: Moderate narrowing at C6-C7, mild narrowing at C3-C4 with partial fusion posteriorly. Disc space calcification present at C2-C3 and  C4-C5. Hypertrophic facet changes on the right at C2-C3 and C3-C4. Bilateral facet hypertrophy at C4-C5. Mild facet disease bilaterally at C5-C6.  Small posterior disc osteophyte complex is present at C5-C6 and C6-C7 without significant bony canal compromise. Upper chest: There is biapical pleural-parenchymal scarring with calcified pleural plaques present within the bilateral lung apices. Focal soft tissue thickening with probable bronchiectasis in the apical segment of the right upper lobe, is favored to represent parenchymal scarring. Other: None IMPRESSION: 1. No definite CT evidence for acute intracranial abnormality 2. Interval finding of focal CSF density within the right periventricular white matter consistent with remote lacunar infarct, this is new since the previous CT scan from May 2017. 3. Extensive periventricular white matter hypodensity, consistent with small vessel ischemic change. 4. Age indeterminate right nasal bone fracture, given the absence of overlying soft tissue swelling, favor old injury. Otherwise no acute facial bone fracture identified. Right periorbital soft tissue swelling. 5. No CT evidence for acute bony abnormality of the cervical spine. Multilevel degenerative disc disease as described above 6. Soft tissue thickening and pleural calcifications within the bilateral lung apices; this is favored to represent pleural parenchymal scarring. 7. Vascular calcifications Electronically Signed   By: Donavan Foil M.D.   On: 12/15/2015 18:53     Scheduled Meds: . amLODipine  5 mg Oral Daily  . aspirin  325 mg Oral Daily  . atorvastatin  40 mg Oral q1800  . cefTRIAXone (ROCEPHIN) IVPB 1 gram/50 mL D5W  1 g Intravenous QHS  . DULoxetine  30 mg Oral Daily  . lisinopril  5 mg Oral Daily  . sodium chloride flush  3 mL Intravenous Q12H   Continuous Infusions:    LOS: 0 days   Time Spent in minutes   30 minutes  Daisja Kessinger D.O. on 12/16/2015 at 12:35 PM  Between 7am to 7pm - Pager - [phone]  After 7pm go to [email or web address] - password [email or web address]  And look for the night coverage person covering for me after  hours  [facility] Office  [phone]

## 2015-12-16 NOTE — Evaluation (Addendum)
Physical Therapy Evaluation Patient Details Name: Kathy Calhoun MRN: [id number] DOB: 09-21-1933 Today's Date: 12/16/2015   History of Present Illness  82 y.o. female with medical history significant of CVA March 2015 residual right-sided deficit., HTN, HLD presented with sudden fall 12/15/15. Patient was out walking her dog when she woke up on the pavement she reports right-sided head injury. She is unsure why she fell. Reports waking up on the ground denies tripping. No loss of bowel or bladder. No prior episodes like that. 2-3 months ago she had vertigo but no syncope    Clinical Impression  Pt admitted with above diagnosis. Patient lives alone and currently requires minguard assist for ambulation for safety. Patient technically not orthostatic, however BP did decrease with activity (131/65 to 125/63) and she reported a "heavy head" feeling. Denied vertigo while walking, however reported vertigo symptoms with rolling to her right. Recommend vestibular evaluation for possible recurrent BPPV. Pt currently with functional limitations due to the deficits listed below (see PT Problem List).  Pt will benefit from skilled PT to increase their independence and safety with mobility to allow discharge to the venue listed below.       Follow Up Recommendations Home health PT    Equipment Recommendations  None recommended by PT    Recommendations for Other Services       Precautions / Restrictions Precautions Precautions: Fall      Mobility  Bed Mobility Overal bed mobility: Modified Independent             General bed mobility comments: HOB elevated  Transfers Overall transfer level: Modified independent Equipment used: Straight cane             General transfer comment: x3 without imbalance  Ambulation/Gait Ambulation/Gait assistance: Min guard Ambulation Distance (Feet): 12 Feet (12, 55) Assistive device: Straight cane Gait Pattern/deviations: Step-through  pattern;Decreased stride length;Wide base of support     General Gait Details: cautious, especially when turning due to fear of vertigo (denied); min assist for safety  Stairs            Wheelchair Mobility    Modified Rankin (Stroke Patients Only)       Balance Overall balance assessment: Needs assistance Sitting-balance support: No upper extremity supported;Feet supported Sitting balance-Leahy Scale: Good     Standing balance support: No upper extremity supported Standing balance-Leahy Scale: Fair                               Pertinent Vitals/Pain Pain Assessment: Faces Faces Pain Scale: Hurts even more Pain Location: head/forehead Pain Descriptors / Indicators: Discomfort;Grimacing;Headache Pain Intervention(s): Limited activity within patient's tolerance;Monitored during session    [place] expects to be discharged to:: Private residence Living Arrangements: Alone Available Help at Discharge: Family;Available PRN/intermittently (son lives in town)           Home Equipment: Kasandra Knudsen - single point      Prior Function Level of Independence: Needs assistance   Gait / Transfers Assistance Needed: modified independent with cane; does not drive  ADL's / Homemaking Assistance Needed: modified independent         Hand Dominance        Extremity/Trunk Assessment   Upper Extremity Assessment: RUE deficits/detail RUE Deficits / Details: hemiparetic, but with functional use         Lower Extremity Assessment: RLE deficits/detail RLE Deficits / Details: decreased strength compared to LLE (grossly  4/5)    Cervical / Trunk Assessment: Normal  Communication   Communication: No difficulties  Cognition Arousal/Alertness: Awake/alert Behavior During Therapy: WFL for tasks assessed/performed Overall Cognitive Status: Within Functional Limits for tasks assessed                      General Comments      Exercises      Assessment/Plan    PT Assessment Patient needs continued PT services  PT Problem List Decreased balance;Decreased mobility;Cardiopulmonary status limiting activity;Pain          PT Treatment Interventions Gait training;Functional mobility training;Stair training;Therapeutic activities;Balance training;Patient/family education    PT Goals (Current goals can be found in the Care Plan section)  Acute Rehab PT Goals Patient Stated Goal: return to walking her dog PT Goal Formulation: With patient Time For Goal Achievement: 12/23/15 Potential to Achieve Goals: Good    Frequency Min 3X/week   Barriers to discharge Decreased caregiver support      Co-evaluation               End of Session Equipment Utilized During Treatment: Gait belt Activity Tolerance: Patient tolerated treatment well Patient left: in chair;with call bell/phone within reach;with chair alarm set           Time: [id number] PT Time Calculation (min) (ACUTE ONLY): 26 min   Charges:   PT Evaluation $PT Eval Low Complexity: 1 Procedure PT Treatments $Gait Training: 8-22 mins   PT G Codes:        Bennye Nix Jan 02, 2016, 9:09 PM Pager [phone]

## 2015-12-16 NOTE — Consult Note (Signed)
Reason for Consult: syncope   Referring Physician: Dr. Ree Kida   PCP:  Kathy Argyle, MD  Primary Cardiologist:new  Dr. Lorinda Calhoun is an 82 y.o. female.    Chief Complaint: admitted 12/15/15 after fall, unsure why she fell.   HPI: Asked to see 82 yr old female with hx of CVA 05/2013 with residual Rt dised deficit, HTN, HLD, stroke was felt to be small vessel disease.   No know CAD. Hx of nuc study 08/2013 with no ischemia and EF 64.  Echo in 2015 with EF 55% , G1DD, AV appeared trileaflet wit 1 cusp heavily calcified and immobile but could not rule out Bicuspid morphology.  PA pressure 28mHg.  She has had recent vertigo and underwent PT exercises which did help.  She has not been aware of an irregular HR.  She had no vertigo prior to syncope.     She was walking her dog after her usual Breakfast and woke up on ground.  No dizziness.  She felt "heavy headed" but walked home and then felt her normal self except pain near Rt eye that had hematoma and abrasion.  Family notified Dr. SCarlyle Lipaoffice and pt instructed to come to ER.   Troponins are negative X 3,  EKG SR with Q waves V1 V2, low voltage pericardial leads but no changes from 07/2015.   BP on arrival was 148/55 and no orthostasis.  Carotid doppler with 1-39% stenosis, vertebral arteries antegr  Tele:  SR to ST.    Currently no complaints.  No chest pain, no SOB.  No hx. Of CAD.      Past Medical History:  Diagnosis Date  . Disc    bulging lumbar disc has had steriod injections  x3  . HTN (hypertension)   . Osteoporosis   . Stroke ([facility]     Past Surgical History:  Procedure Laterality Date  . APPENDECTOMY    . CATARACT EXTRACTION     bilaterally 2011  . HYSTEROTOMY    . LAPAROTOMY     large benign mass removed 1993  . MOUTH SURGERY    . NASAL SEPTUM SURGERY    . OOPHORECTOMY    . TUBAL LIGATION      Family History  Problem Relation Age of Onset  . Cancer Father   . Dementia  Brother   . Dementia Sister    Social History:  reports that she has never smoked. She has never used smokeless tobacco. She reports that she does not drink alcohol or use drugs.  Allergies:  Allergies  Allergen Reactions  . Codeine Rash  . Sulfa Antibiotics Rash    OUTPATIENT MEDICATIONS: No current facility-administered medications on file prior to encounter.    Current Outpatient Prescriptions on File Prior to Encounter  Medication Sig Dispense Refill  . aspirin 325 MG tablet Take 1 tablet (325 mg total) by mouth daily.    .Marland Kitchenatorvastatin (LIPITOR) 40 MG tablet Take 40 mg by mouth daily.    . Calcium-Vitamin D (CALTRATE 600 PLUS-VIT D PO) Take 1 tablet by mouth daily.     . diclofenac sodium (VOLTAREN) 1 % GEL Apply 2 g topically 3 (three) times daily before meals. To right shoulder (Patient taking differently: Apply 2 g topically 4 (four) times daily as needed (for pain). To right shoulder) 3 Tube 1  . DULoxetine (CYMBALTA) 30 MG capsule TAKE (1) CAPSULE DAILY. 30 capsule 3  . lisinopril (PRINIVIL,ZESTRIL) 5 MG  tablet Take 5 mg by mouth daily.     . meclizine (ANTIVERT) 25 MG tablet Take 1 tablet (25 mg total) by mouth 3 (three) times daily as needed for dizziness. 30 tablet 0  . Multiple Vitamins-Minerals (MULTIVITAMIN WITH MINERALS) tablet Take 1 tablet by mouth daily.     CURRENT MEDICATIONS: Scheduled Meds: . amLODipine  5 mg Oral Daily  . aspirin  325 mg Oral Daily  . atorvastatin  40 mg Oral q1800  . cefTRIAXone (ROCEPHIN) IVPB 1 gram/50 mL D5W  1 g Intravenous QHS  . DULoxetine  30 mg Oral Daily  . lisinopril  5 mg Oral Daily  . sodium chloride flush  3 mL Intravenous Q12H   Continuous Infusions:  PRN Meds:.acetaminophen   Results for orders placed or performed during the hospital encounter of 12/15/15 (from the past 48 hour(s))  CBC with Differential     Status: None   Collection Time: 12/15/15  5:26 PM  Result Value Ref Range   WBC 7.8 4.0 - 10.5 K/uL   RBC  4.61 3.87 - 5.11 MIL/uL   Hemoglobin 14.4 12.0 - 15.0 g/dL   HCT 43.9 36.0 - 46.0 %   MCV 95.2 78.0 - 100.0 fL   MCH 31.2 26.0 - 34.0 pg   MCHC 32.8 30.0 - 36.0 g/dL   RDW 13.2 11.5 - 15.5 %   Platelets 292 150 - 400 K/uL   Neutrophils Relative % 51 %   Neutro Abs 3.9 1.7 - 7.7 K/uL   Lymphocytes Relative 37 %   Lymphs Abs 2.9 0.7 - 4.0 K/uL   Monocytes Relative 8 %   Monocytes Absolute 0.6 0.1 - 1.0 K/uL   Eosinophils Relative 3 %   Eosinophils Absolute 0.2 0.0 - 0.7 K/uL   Basophils Relative 1 %   Basophils Absolute 0.1 0.0 - 0.1 K/uL  Basic metabolic panel     Status: Abnormal   Collection Time: 12/15/15  5:26 PM  Result Value Ref Range   Sodium 138 135 - 145 mmol/L   Potassium 4.0 3.5 - 5.1 mmol/L   Chloride 106 101 - 111 mmol/L   CO2 19 (L) 22 - 32 mmol/L   Glucose, Bld 96 65 - 99 mg/dL   BUN 14 6 - 20 mg/dL   Creatinine, Ser 0.97 0.44 - 1.00 mg/dL   Calcium 9.9 8.9 - 10.3 mg/dL   GFR calc non Af Amer 54 (L) >60 mL/min   GFR calc Af Amer >60 >60 mL/min    Comment: (NOTE) The eGFR has been calculated using the CKD EPI equation. This calculation has not been validated in all clinical situations. eGFR's persistently <60 mL/min signify possible Chronic Kidney Disease.    Anion gap 13 5 - 15  CK     Status: None   Collection Time: 12/15/15  5:26 PM  Result Value Ref Range   Total CK 72 38 - 234 U/L  Urinalysis, Routine w reflex microscopic (not at [facility])     Status: Abnormal   Collection Time: 12/15/15  5:30 PM  Result Value Ref Range   Color, Urine YELLOW YELLOW   APPearance CLOUDY (A) CLEAR   Specific Gravity, Urine 1.007 1.005 - 1.030   pH 6.0 5.0 - 8.0   Glucose, UA NEGATIVE NEGATIVE mg/dL   Hgb urine dipstick MODERATE (A) NEGATIVE   Bilirubin Urine NEGATIVE NEGATIVE   Ketones, ur NEGATIVE NEGATIVE mg/dL   Protein, ur NEGATIVE NEGATIVE mg/dL   Nitrite NEGATIVE NEGATIVE  Leukocytes, UA LARGE (A) NEGATIVE  Urine microscopic-add on     Status: Abnormal    Collection Time: 12/15/15  5:30 PM  Result Value Ref Range   Squamous Epithelial / LPF 0-5 (A) NONE SEEN   WBC, UA 6-30 0 - 5 WBC/hpf   RBC / HPF 0-5 0 - 5 RBC/hpf   Bacteria, UA MANY (A) NONE SEEN  I-Stat Troponin, ED (not at [facility])     Status: None   Collection Time: 12/15/15  5:36 PM  Result Value Ref Range   Troponin i, poc 0.00 0.00 - 0.08 ng/mL   Comment 3            Comment: Due to the release kinetics of cTnI, a negative result within the first hours of the onset of symptoms does not rule out myocardial infarction with certainty. If myocardial infarction is still suspected, repeat the test at appropriate intervals.   Troponin I     Status: None   Collection Time: 12/16/15  1:51 AM  Result Value Ref Range   Troponin I <0.03 <0.03 ng/mL  Glucose, capillary     Status: None   Collection Time: 12/16/15  5:30 AM  Result Value Ref Range   Glucose-Capillary 90 65 - 99 mg/dL  Troponin I     Status: None   Collection Time: 12/16/15  8:19 AM  Result Value Ref Range   Troponin I <0.03 <0.03 ng/mL   Dg Chest 2 View  Result Date: 12/15/2015 CLINICAL DATA:  Fall today.  Initial encounter. EXAM: CHEST  2 VIEW COMPARISON:  05/12/2013 FINDINGS: The heart size and mediastinal contours are within normal limits. Stable scarring at both lung apices. There is no evidence of pulmonary edema, consolidation, pneumothorax, nodule or pleural fluid. No bony fractures are seen. Stable osteopenia of the spine. IMPRESSION: No active cardiopulmonary disease. Electronically Signed   By: Aletta Edouard M.D.   On: 12/15/2015 16:45   Ct Head Wo Contrast  Result Date: 12/15/2015 CLINICAL DATA:  Patient fell today while walking around the block. Loss of consciousness. Has bruising up of an under right eye. Patient states pain is in right side of head. EXAM: CT HEAD WITHOUT CONTRAST CT MAXILLOFACIAL WITHOUT CONTRAST CT CERVICAL SPINE WITHOUT CONTRAST TECHNIQUE: Multidetector CT imaging of the head, cervical  spine, and maxillofacial structures were performed using the standard protocol without intravenous contrast. Multiplanar CT image reconstructions of the cervical spine and maxillofacial structures were also generated. COMPARISON:  MRI brain 07/12/2015, CT scan brain 07/12/2015, 05/12/2013. FINDINGS: CT HEAD FINDINGS Brain: There is no evidence for acute territorial infarction, acute intracranial hemorrhage, focal mass lesion, midline shift or mass effect. Extensive periventricular white matter hypodensities are again evident, consistent with small vessel ischemic disease. Interim finding of a focal CSF density within the right periventricular white matter, consistent with lacunar infarct, this appears old but is new since the prior CT scan 07/12/2015. Mild atrophy is again visualized. The ventricles are slightly enlarged but similar in size and configuration compared to the previous exam. Vascular: Calcifications are present within the internal carotid arteries at the skullbase. Bilateral vertebral artery calcifications are also present. Skull: Negative for acute fracture or suspicious bone lesion. Mild hypoplasia of the head of the left mandible, similar compared to prior. Sinuses/Orbits: No acute finding. Other: None CT MAXILLOFACIAL FINDINGS Osseous: There is no mandibular fracture identified. Bilateral zygomatic arches and pterygoid plates appear intact. Minimally offset right nasal bone fracture identified without significant overlying soft tissue hematoma. Mastoid air  cells are clear. Mild hypoplasia of the head of the left mandible as before. Orbits: Intact and without evidence for displaced fracture. Extraocular muscles are symmetric. Sinuses: No acute fluid levels visualized. No significant mucosal thickening. Soft tissues: Mild right periorbital and supraorbital soft tissue swelling. Limited intracranial: Atrophy with periventricular white matter ischemic changes. CT CERVICAL SPINE FINDINGS Alignment:  Minimal anterior listhesis of C5 on C6. Bony alignment otherwise within normal limits. Skull base and vertebrae: Craniovertebral junction is intact. Mastoid air cells are clear. Partial fusion posteriorly at C3-C4. Vertebral body heights are maintained. Soft tissues and spinal canal: Prevertebral soft tissue thickness is normal. Epiglottis is thin and normal. Thyroid gland slightly heterogeneous but without focal mass. No abnormally enlarged neck lymph nodes identified. No bony impingement of the spinal canal. There are bilateral carotid artery calcifications. Disc levels: Moderate narrowing at C6-C7, mild narrowing at C3-C4 with partial fusion posteriorly. Disc space calcification present at C2-C3 and C4-C5. Hypertrophic facet changes on the right at C2-C3 and C3-C4. Bilateral facet hypertrophy at C4-C5. Mild facet disease bilaterally at C5-C6. Small posterior disc osteophyte complex is present at C5-C6 and C6-C7 without significant bony canal compromise. Upper chest: There is biapical pleural-parenchymal scarring with calcified pleural plaques present within the bilateral lung apices. Focal soft tissue thickening with probable bronchiectasis in the apical segment of the right upper lobe, is favored to represent parenchymal scarring. Other: None IMPRESSION: 1. No definite CT evidence for acute intracranial abnormality 2. Interval finding of focal CSF density within the right periventricular white matter consistent with remote lacunar infarct, this is new since the previous CT scan from May 2017. 3. Extensive periventricular white matter hypodensity, consistent with small vessel ischemic change. 4. Age indeterminate right nasal bone fracture, given the absence of overlying soft tissue swelling, favor old injury. Otherwise no acute facial bone fracture identified. Right periorbital soft tissue swelling. 5. No CT evidence for acute bony abnormality of the cervical spine. Multilevel degenerative disc disease as  described above 6. Soft tissue thickening and pleural calcifications within the bilateral lung apices; this is favored to represent pleural parenchymal scarring. 7. Vascular calcifications Electronically Signed   By: Donavan Foil M.D.   On: 12/15/2015 18:53   Ct Cervical Spine Wo Contrast  Result Date: 12/15/2015 CLINICAL DATA:  Patient fell today while walking around the block. Loss of consciousness. Has bruising up of an under right eye. Patient states pain is in right side of head. EXAM: CT HEAD WITHOUT CONTRAST CT MAXILLOFACIAL WITHOUT CONTRAST CT CERVICAL SPINE WITHOUT CONTRAST TECHNIQUE: Multidetector CT imaging of the head, cervical spine, and maxillofacial structures were performed using the standard protocol without intravenous contrast. Multiplanar CT image reconstructions of the cervical spine and maxillofacial structures were also generated. COMPARISON:  MRI brain 07/12/2015, CT scan brain 07/12/2015, 05/12/2013. FINDINGS: CT HEAD FINDINGS Brain: There is no evidence for acute territorial infarction, acute intracranial hemorrhage, focal mass lesion, midline shift or mass effect. Extensive periventricular white matter hypodensities are again evident, consistent with small vessel ischemic disease. Interim finding of a focal CSF density within the right periventricular white matter, consistent with lacunar infarct, this appears old but is new since the prior CT scan 07/12/2015. Mild atrophy is again visualized. The ventricles are slightly enlarged but similar in size and configuration compared to the previous exam. Vascular: Calcifications are present within the internal carotid arteries at the skullbase. Bilateral vertebral artery calcifications are also present. Skull: Negative for acute fracture or suspicious bone lesion. Mild hypoplasia of the  head of the left mandible, similar compared to prior. Sinuses/Orbits: No acute finding. Other: None CT MAXILLOFACIAL FINDINGS Osseous: There is no mandibular  fracture identified. Bilateral zygomatic arches and pterygoid plates appear intact. Minimally offset right nasal bone fracture identified without significant overlying soft tissue hematoma. Mastoid air cells are clear. Mild hypoplasia of the head of the left mandible as before. Orbits: Intact and without evidence for displaced fracture. Extraocular muscles are symmetric. Sinuses: No acute fluid levels visualized. No significant mucosal thickening. Soft tissues: Mild right periorbital and supraorbital soft tissue swelling. Limited intracranial: Atrophy with periventricular white matter ischemic changes. CT CERVICAL SPINE FINDINGS Alignment: Minimal anterior listhesis of C5 on C6. Bony alignment otherwise within normal limits. Skull base and vertebrae: Craniovertebral junction is intact. Mastoid air cells are clear. Partial fusion posteriorly at C3-C4. Vertebral body heights are maintained. Soft tissues and spinal canal: Prevertebral soft tissue thickness is normal. Epiglottis is thin and normal. Thyroid gland slightly heterogeneous but without focal mass. No abnormally enlarged neck lymph nodes identified. No bony impingement of the spinal canal. There are bilateral carotid artery calcifications. Disc levels: Moderate narrowing at C6-C7, mild narrowing at C3-C4 with partial fusion posteriorly. Disc space calcification present at C2-C3 and C4-C5. Hypertrophic facet changes on the right at C2-C3 and C3-C4. Bilateral facet hypertrophy at C4-C5. Mild facet disease bilaterally at C5-C6. Small posterior disc osteophyte complex is present at C5-C6 and C6-C7 without significant bony canal compromise. Upper chest: There is biapical pleural-parenchymal scarring with calcified pleural plaques present within the bilateral lung apices. Focal soft tissue thickening with probable bronchiectasis in the apical segment of the right upper lobe, is favored to represent parenchymal scarring. Other: None IMPRESSION: 1. No definite CT  evidence for acute intracranial abnormality 2. Interval finding of focal CSF density within the right periventricular white matter consistent with remote lacunar infarct, this is new since the previous CT scan from May 2017. 3. Extensive periventricular white matter hypodensity, consistent with small vessel ischemic change. 4. Age indeterminate right nasal bone fracture, given the absence of overlying soft tissue swelling, favor old injury. Otherwise no acute facial bone fracture identified. Right periorbital soft tissue swelling. 5. No CT evidence for acute bony abnormality of the cervical spine. Multilevel degenerative disc disease as described above 6. Soft tissue thickening and pleural calcifications within the bilateral lung apices; this is favored to represent pleural parenchymal scarring. 7. Vascular calcifications Electronically Signed   By: Donavan Foil M.D.   On: 12/15/2015 18:53   Ct Maxillofacial Wo Contrast  Result Date: 12/15/2015 CLINICAL DATA:  Patient fell today while walking around the block. Loss of consciousness. Has bruising up of an under right eye. Patient states pain is in right side of head. EXAM: CT HEAD WITHOUT CONTRAST CT MAXILLOFACIAL WITHOUT CONTRAST CT CERVICAL SPINE WITHOUT CONTRAST TECHNIQUE: Multidetector CT imaging of the head, cervical spine, and maxillofacial structures were performed using the standard protocol without intravenous contrast. Multiplanar CT image reconstructions of the cervical spine and maxillofacial structures were also generated. COMPARISON:  MRI brain 07/12/2015, CT scan brain 07/12/2015, 05/12/2013. FINDINGS: CT HEAD FINDINGS Brain: There is no evidence for acute territorial infarction, acute intracranial hemorrhage, focal mass lesion, midline shift or mass effect. Extensive periventricular white matter hypodensities are again evident, consistent with small vessel ischemic disease. Interim finding of a focal CSF density within the right periventricular  white matter, consistent with lacunar infarct, this appears old but is new since the prior CT scan 07/12/2015. Mild atrophy is again visualized.  The ventricles are slightly enlarged but similar in size and configuration compared to the previous exam. Vascular: Calcifications are present within the internal carotid arteries at the skullbase. Bilateral vertebral artery calcifications are also present. Skull: Negative for acute fracture or suspicious bone lesion. Mild hypoplasia of the head of the left mandible, similar compared to prior. Sinuses/Orbits: No acute finding. Other: None CT MAXILLOFACIAL FINDINGS Osseous: There is no mandibular fracture identified. Bilateral zygomatic arches and pterygoid plates appear intact. Minimally offset right nasal bone fracture identified without significant overlying soft tissue hematoma. Mastoid air cells are clear. Mild hypoplasia of the head of the left mandible as before. Orbits: Intact and without evidence for displaced fracture. Extraocular muscles are symmetric. Sinuses: No acute fluid levels visualized. No significant mucosal thickening. Soft tissues: Mild right periorbital and supraorbital soft tissue swelling. Limited intracranial: Atrophy with periventricular white matter ischemic changes. CT CERVICAL SPINE FINDINGS Alignment: Minimal anterior listhesis of C5 on C6. Bony alignment otherwise within normal limits. Skull base and vertebrae: Craniovertebral junction is intact. Mastoid air cells are clear. Partial fusion posteriorly at C3-C4. Vertebral body heights are maintained. Soft tissues and spinal canal: Prevertebral soft tissue thickness is normal. Epiglottis is thin and normal. Thyroid gland slightly heterogeneous but without focal mass. No abnormally enlarged neck lymph nodes identified. No bony impingement of the spinal canal. There are bilateral carotid artery calcifications. Disc levels: Moderate narrowing at C6-C7, mild narrowing at C3-C4 with partial fusion  posteriorly. Disc space calcification present at C2-C3 and C4-C5. Hypertrophic facet changes on the right at C2-C3 and C3-C4. Bilateral facet hypertrophy at C4-C5. Mild facet disease bilaterally at C5-C6. Small posterior disc osteophyte complex is present at C5-C6 and C6-C7 without significant bony canal compromise. Upper chest: There is biapical pleural-parenchymal scarring with calcified pleural plaques present within the bilateral lung apices. Focal soft tissue thickening with probable bronchiectasis in the apical segment of the right upper lobe, is favored to represent parenchymal scarring. Other: None IMPRESSION: 1. No definite CT evidence for acute intracranial abnormality 2. Interval finding of focal CSF density within the right periventricular white matter consistent with remote lacunar infarct, this is new since the previous CT scan from May 2017. 3. Extensive periventricular white matter hypodensity, consistent with small vessel ischemic change. 4. Age indeterminate right nasal bone fracture, given the absence of overlying soft tissue swelling, favor old injury. Otherwise no acute facial bone fracture identified. Right periorbital soft tissue swelling. 5. No CT evidence for acute bony abnormality of the cervical spine. Multilevel degenerative disc disease as described above 6. Soft tissue thickening and pleural calcifications within the bilateral lung apices; this is favored to represent pleural parenchymal scarring. 7. Vascular calcifications Electronically Signed   By: Donavan Foil M.D.   On: 12/15/2015 18:53    ROS: General:no colds or fevers, no weight changes Skin:no rashes or ulcers HEENT:no blurred vision, no congestion CV:see HPI PUL:see HPI GI:no diarrhea constipation or melena, no indigestion GU:no hematuria, no dysuria MS:no joint pain, no claudication Neuro:+ syncope, hx dizziness, +Vertigo Endo:no diabetes, no thyroid disease   Blood pressure (!) 136/58, pulse 79, temperature 98  F (36.7 C), temperature source Oral, resp. rate 18, height '4\' 11"'  (1.499 m), weight 133 lb 11.2 oz (60.6 kg), SpO2 98 %.  Wt Readings from Last 3 Encounters:  12/16/15 133 lb 11.2 oz (60.6 kg)  06/24/15 133 lb 12.8 oz (60.7 kg)  06/24/14 124 lb (56.2 kg)    PE: General:Pleasant affect, NAD Skin:Warm and dry, brisk capillary  refill, around Rt eye with abrasion  HEENT:normocephalic, sclera clear, mucus membranes moist Neck:supple, no JVD, no bruits  Heart:S1S2 RRR with soft murmur, gallup, rub or click Lungs:clear without rales, rhonchi, or wheezes FBX:UXYB, non tender, + BS, do not palpate liver spleen or masses Ext:no lower ext edema, 2+ pedal pulses, 2+ radial pulses Neuro:alert and oriented X 3, MAE, follows commands, + facial symmetry    Assessment/Plan Active Problems:   Essential hypertension, benign   Depression   Syncope and collapse   Bacteria in urine   Fall  Syncope:  Agree with Echo -waiting for results. ? AS as cause.  Add event monitor and if no results may need Linq?  Dr. Marlou Porch to see.  No MI no chest pain, neg nuc 2015.   Pt to go to [place] in near future by plane.     Kathy Calhoun  Nurse Practitioner Certified [place] Pager [phone] or after 5pm or weekends call [phone] 12/16/2015, 12:42 PM  Personally seen and examined. Agree with above.  82 year old with syncope while walking dog with no prodrome and no prior syncope. Prior CVA history with post CVA vertigo.   RRR, 1/6 SEM, lungs clear  Syncope  - Event monitor 30 day  - She does not drive (for past 3 years),  - Would hold off of plane flight for now.   - Looking for possible sinus pause??  - ECG normal  - ECHO pending  - Aortic stenosis as cause doubtful (last ECHO with normal transvalvular gradient).   - ? Mechanical fall - but did not remember tripping.   - ? Transient vertigo causing fall  Will follow. If tele stable overnight and ECHO OK, likely DC  tomorrow with event monitor set up.  Candee Furbish, MD

## 2015-12-16 NOTE — Progress Notes (Signed)
Preliminary results by tech - Carotid Duplex Completed. No evidence of a significant stenosis in bilateral carotid arteries. 1-39%. Vertebral arteries demonstrated antegrade flow.  Oda Cogan, BS, RDMS, RVT

## 2015-12-16 NOTE — Progress Notes (Signed)
Pt arrived to the unit via stretcher from the ED. She is A & O, NAD noted, able to communicate needs , will continue to monitor.

## 2015-12-17 ENCOUNTER — Inpatient Hospital Stay (HOSPITAL_COMMUNITY): Payer: Medicare Other

## 2015-12-17 ENCOUNTER — Other Ambulatory Visit: Payer: Self-pay | Admitting: Cardiology

## 2015-12-17 DIAGNOSIS — R55 Syncope and collapse: Secondary | ICD-10-CM

## 2015-12-17 DIAGNOSIS — I10 Essential (primary) hypertension: Secondary | ICD-10-CM

## 2015-12-17 LAB — URINE CULTURE

## 2015-12-17 LAB — HEMOGLOBIN A1C
HEMOGLOBIN A1C: 5.6 % (ref 4.8–5.6)
MEAN PLASMA GLUCOSE: 114 mg/dL

## 2015-12-17 LAB — GLUCOSE, CAPILLARY: Glucose-Capillary: 94 mg/dL (ref 65–99)

## 2015-12-17 LAB — ECHOCARDIOGRAM COMPLETE
HEIGHTINCHES: 59 in
Weight: 2140.8 oz

## 2015-12-17 MED ORDER — CEFUROXIME AXETIL 250 MG PO TABS: 250.0000 mg | ORAL_TABLET | Freq: Two times a day (BID) | ORAL | 0 refills | Status: DC

## 2015-12-17 NOTE — Care Management Note (Signed)
Case Management Note  Patient Details  Name: Kathy Calhoun MRN: [id number] Date of Birth: [birth year]  Subjective/Objective:   Admitted with Syncope                 Action/Plan: CM talked to patient at bedside, her son moved in with her a year ago. Has private insurance with Medicare / [facility] with prescription drug coverage; pharmacy of choice is [facility], her medication is delivered to her home on Wednesday; [facility] choice offered. Patient requested [facility]. Butch Penny with [facility] called for referral. She also requested a rolling walker at discharge. Attending MD at discharge please enter the face to face in Epic for [facility] services.  Expected Discharge Date:  12/17/2015              Expected Discharge Plan:  Ord  Discharge planning Services  CM Consult   Choice offered to:  Patient  HH Arranged:  RN, PT [facility] Agency:  [place]  Status of Service:  In process, will continue to follow  Sherrilyn Rist [id number] 12/17/2015, 12:41 PM

## 2015-12-17 NOTE — Discharge Instructions (Signed)

## 2015-12-17 NOTE — Consult Note (Signed)
   [facility] CM Inpatient Consult   12/17/2015  Kathy Calhoun 19-Nov-1933 [id number]   Patient screened for potential [facility] services. Patient is eligible for [facility] Care Management services under patient's Medicare  Plan.  No current needs identified for community care management at this time.  Will have home health follow up.  Dr. Lajean Manes is the primary care provider.  This is the patient's first admission.   Please place a [facility] Care Management consult or for questions contact:   Natividad Brood, RN BSN [facility] Liaison  [phone] business mobile phone Toll free office [phone]

## 2015-12-17 NOTE — Progress Notes (Signed)
Patient given discharge instructions and all questions answered.  Pt. Waiting for son to come pick her up.

## 2015-12-17 NOTE — Discharge Summary (Signed)
Physician Discharge Summary  Kathy Calhoun [id number] DOB: 1933-04-22 DOA: 12/15/2015  PCP: Mathews Argyle, MD  Admit date: 12/15/2015 Discharge date: 12/17/2015  Time spent: 45 minutes  Recommendations for Outpatient Follow-up:  Patient will be discharged to home with home health PT.  Patient will need to follow up with primary care provider within one week of discharge.  Follow up with cardiology.  Patient should continue medications as prescribed.  Patient should follow a heart healthy diet.   Discharge Diagnoses:  Active Problems:   Essential hypertension, benign   Depression   Syncope and collapse   Bacteria in urine   Fall   Discharge Condition: Stable  Diet recommendation: Heart healthy  Filed Weights   12/16/15 0014 12/16/15 0540 12/17/15 0640  Weight: 60.9 kg (134 lb 3.2 oz) 60.6 kg (133 lb 11.2 oz) 60.7 kg (133 lb 12.8 oz)    History of present illness:  on 12/15/2015 by Dr. West Pugh a 82 y.o.femalewith medical history significant of CVAMarch 2015 residual right-sided deficit., HTN, HLD Presented with sudden fall today patient was out walking her dog when she woke up on the pavement she reports right-sided head injury. She is unsure why she fell. Reports waking waking up on the ground denies tripping. No loss of bowel or bladder. No prior episodes like that. 2-3 months ago she had vertigo but no syncope. Her fall occurred at 11:30 AM. Patient does not recall any chest pain shortness of breath presyncope prior to fall. She is unsure for how long she's been on the flow but was able to get up and walk home. No nausea vomiting associated this no abdominal pain no shortness of breath no urinarycomplaints. Family was made concerned regarding possibility of syncope and wanted patient to be evaluated further denies burning with urination, no frequency no fever. Not confused Regarding pertinent Chronic problems: reguarding history of stroke  she's been treated with aspirin. She has mild residual right side weakness and walks occasionally with a cane.   Hospital Course:  Syncope and collapse -CT head showed no acute intracranial abnormality -Carotid Doppler: No evidence of significant stenosis in bilateral carotid arteries. 139%. Vertebral artery demonstrated antegrade flow -Orthostatics vitals unremarkable  -Echocardiogram EF 0000000, grade 1 diastolic dysfunction. No defect or patent foramen ovale identified. -Cardiology consulted and appreciated, recommended event monitor  -Troponins cycled and negative -Discussed not driving or any travel by plane. Patient states she has not driven in over 3 years since her stroke.  Asymptomatic bacteriuria -UA showed many bacteria, 6-30 WBC, large leukocytes -Urine culture multiple species -Patient denies any dysuria however did have some suprapubic tenderness upon admission (as per H&P) -Placed on ceftriaxone. Discharged with ceftin.   Fall -PT and OT consulted -PT recommended home health  Essential hypertension -Continue amlodipine, lisinopril  Depression -Continue Cymbalta  Nasal bone fracture -Noted on CT, age-indeterminate right nasal bone fracture, favor old injury  History of CVA -Continue aspirin and statin  Procedures: Carotid doppler Echocardiogram  Consultations: Cardiology  Discharge Exam: Vitals:   12/17/15 0640 12/17/15 0830  BP: 134/64 (!) 141/64  Pulse: 93 84  Resp: 18 20  Temp: 98 F (36.7 C) 98 F (36.7 C)    Exam  General: Well developed, well nourished, NAD, appears stated age  HEENT: [place], right orbital and frontal ecchymosis, mucous membranes moist.   Neck: Supple, no JVD, no masses  Cardiovascular: S1 S2 auscultated, 1/6 SEM. Regular rate and rhythm.  Respiratory: Clear to auscultation bilaterally with  equal chest rise  Abdomen: Soft, nontender, nondistended, + bowel sounds  Extremities: warm dry without cyanosis clubbing or  edema  Neuro: AAOx3, nonfocal  Psych: Normal affect and demeanor with intact judgement and insight, pleasant  Discharge Instructions Discharge Instructions    Discharge instructions    Complete by:  As directed    Patient will be discharged to home with home health PT.  Patient will need to follow up with primary care provider within one week of discharge.  Follow up with cardiology.  Patient should continue medications as prescribed.  Patient should follow a heart healthy diet.     Current Discharge Medication List    START taking these medications   Details  cefUROXime (CEFTIN) 250 MG tablet Take 1 tablet (250 mg total) by mouth 2 (two) times daily with a meal. Qty: 6 tablet, Refills: 0      CONTINUE these medications which have NOT CHANGED   Details  acetaminophen (TYLENOL) 500 MG tablet Take 1,000 mg by mouth every 6 (six) hours as needed.    amLODipine (NORVASC) 5 MG tablet Take 5 mg by mouth daily.    aspirin 325 MG tablet Take 1 tablet (325 mg total) by mouth daily.    atorvastatin (LIPITOR) 40 MG tablet Take 40 mg by mouth daily.    Calcium-Vitamin D (CALTRATE 600 PLUS-VIT D PO) Take 1 tablet by mouth daily.     diclofenac sodium (VOLTAREN) 1 % GEL Apply 2 g topically 3 (three) times daily before meals. To right shoulder Qty: 3 Tube, Refills: 1    DULoxetine (CYMBALTA) 30 MG capsule TAKE (1) CAPSULE DAILY. Qty: 30 capsule, Refills: 3    lisinopril (PRINIVIL,ZESTRIL) 5 MG tablet Take 5 mg by mouth daily.     meclizine (ANTIVERT) 25 MG tablet Take 1 tablet (25 mg total) by mouth 3 (three) times daily as needed for dizziness. Qty: 30 tablet, Refills: 0    Multiple Vitamins-Minerals (MULTIVITAMIN WITH MINERALS) tablet Take 1 tablet by mouth daily.       Allergies  Allergen Reactions  . Codeine Rash  . Sulfa Antibiotics Rash   Follow-up Information    Mathews Argyle, MD. Go on 12/29/2015.   Specialty:  Internal Medicine Why:  Hospital follow up  @9 :45am Contact information: [address] [place] [address] [phone]        Mark Skains, MD. Schedule an appointment as soon as possible for a visit on 12/24/2015.   Specialty:  Cardiology Why:  Hospital follow up @10 :30am please arrive @10 :15am Contact information: [address] [address] [phone]        [facility] .   Why:  They will do your home health care at your home Contact information: [address] [place] [address] [phone]            The results of significant diagnostics from this hospitalization (including imaging, microbiology, ancillary and laboratory) are listed below for reference.    Significant Diagnostic Studies: Dg Chest 2 View  Result Date: 12/15/2015 CLINICAL DATA:  Fall today.  Initial encounter. EXAM: CHEST  2 VIEW COMPARISON:  05/12/2013 FINDINGS: The heart size and mediastinal contours are within normal limits. Stable scarring at both lung apices. There is no evidence of pulmonary edema, consolidation, pneumothorax, nodule or pleural fluid. No bony fractures are seen. Stable osteopenia of the spine. IMPRESSION: No active cardiopulmonary disease. Electronically Signed   By: Jenness Corner.D.  On: 12/15/2015 16:45   Ct Head Wo Contrast  Result Date: 12/15/2015 CLINICAL DATA:  Patient fell today while walking around the block. Loss of consciousness. Has bruising up of an under right eye. Patient states pain is in right side of head. EXAM: CT HEAD WITHOUT CONTRAST CT MAXILLOFACIAL WITHOUT CONTRAST CT CERVICAL SPINE WITHOUT CONTRAST TECHNIQUE: Multidetector CT imaging of the head, cervical spine, and maxillofacial structures were performed using the standard protocol without intravenous contrast. Multiplanar CT image reconstructions of the cervical spine and maxillofacial structures were also generated. COMPARISON:  MRI brain 07/12/2015, CT scan brain  07/12/2015, 05/12/2013. FINDINGS: CT HEAD FINDINGS Brain: There is no evidence for acute territorial infarction, acute intracranial hemorrhage, focal mass lesion, midline shift or mass effect. Extensive periventricular white matter hypodensities are again evident, consistent with small vessel ischemic disease. Interim finding of a focal CSF density within the right periventricular white matter, consistent with lacunar infarct, this appears old but is new since the prior CT scan 07/12/2015. Mild atrophy is again visualized. The ventricles are slightly enlarged but similar in size and configuration compared to the previous exam. Vascular: Calcifications are present within the internal carotid arteries at the skullbase. Bilateral vertebral artery calcifications are also present. Skull: Negative for acute fracture or suspicious bone lesion. Mild hypoplasia of the head of the left mandible, similar compared to prior. Sinuses/Orbits: No acute finding. Other: None CT MAXILLOFACIAL FINDINGS Osseous: There is no mandibular fracture identified. Bilateral zygomatic arches and pterygoid plates appear intact. Minimally offset right nasal bone fracture identified without significant overlying soft tissue hematoma. Mastoid air cells are clear. Mild hypoplasia of the head of the left mandible as before. Orbits: Intact and without evidence for displaced fracture. Extraocular muscles are symmetric. Sinuses: No acute fluid levels visualized. No significant mucosal thickening. Soft tissues: Mild right periorbital and supraorbital soft tissue swelling. Limited intracranial: Atrophy with periventricular white matter ischemic changes. CT CERVICAL SPINE FINDINGS Alignment: Minimal anterior listhesis of C5 on C6. Bony alignment otherwise within normal limits. Skull base and vertebrae: Craniovertebral junction is intact. Mastoid air cells are clear. Partial fusion posteriorly at C3-C4. Vertebral body heights are maintained. Soft tissues and  spinal canal: Prevertebral soft tissue thickness is normal. Epiglottis is thin and normal. Thyroid gland slightly heterogeneous but without focal mass. No abnormally enlarged neck lymph nodes identified. No bony impingement of the spinal canal. There are bilateral carotid artery calcifications. Disc levels: Moderate narrowing at C6-C7, mild narrowing at C3-C4 with partial fusion posteriorly. Disc space calcification present at C2-C3 and C4-C5. Hypertrophic facet changes on the right at C2-C3 and C3-C4. Bilateral facet hypertrophy at C4-C5. Mild facet disease bilaterally at C5-C6. Small posterior disc osteophyte complex is present at C5-C6 and C6-C7 without significant bony canal compromise. Upper chest: There is biapical pleural-parenchymal scarring with calcified pleural plaques present within the bilateral lung apices. Focal soft tissue thickening with probable bronchiectasis in the apical segment of the right upper lobe, is favored to represent parenchymal scarring. Other: None IMPRESSION: 1. No definite CT evidence for acute intracranial abnormality 2. Interval finding of focal CSF density within the right periventricular white matter consistent with remote lacunar infarct, this is new since the previous CT scan from May 2017. 3. Extensive periventricular white matter hypodensity, consistent with small vessel ischemic change. 4. Age indeterminate right nasal bone fracture, given the absence of overlying soft tissue swelling, favor old injury. Otherwise no acute facial bone fracture identified. Right periorbital soft tissue swelling. 5. No CT evidence for acute  bony abnormality of the cervical spine. Multilevel degenerative disc disease as described above 6. Soft tissue thickening and pleural calcifications within the bilateral lung apices; this is favored to represent pleural parenchymal scarring. 7. Vascular calcifications Electronically Signed   By: Donavan Foil M.D.   On: 12/15/2015 18:53   Ct Cervical  Spine Wo Contrast  Result Date: 12/15/2015 CLINICAL DATA:  Patient fell today while walking around the block. Loss of consciousness. Has bruising up of an under right eye. Patient states pain is in right side of head. EXAM: CT HEAD WITHOUT CONTRAST CT MAXILLOFACIAL WITHOUT CONTRAST CT CERVICAL SPINE WITHOUT CONTRAST TECHNIQUE: Multidetector CT imaging of the head, cervical spine, and maxillofacial structures were performed using the standard protocol without intravenous contrast. Multiplanar CT image reconstructions of the cervical spine and maxillofacial structures were also generated. COMPARISON:  MRI brain 07/12/2015, CT scan brain 07/12/2015, 05/12/2013. FINDINGS: CT HEAD FINDINGS Brain: There is no evidence for acute territorial infarction, acute intracranial hemorrhage, focal mass lesion, midline shift or mass effect. Extensive periventricular white matter hypodensities are again evident, consistent with small vessel ischemic disease. Interim finding of a focal CSF density within the right periventricular white matter, consistent with lacunar infarct, this appears old but is new since the prior CT scan 07/12/2015. Mild atrophy is again visualized. The ventricles are slightly enlarged but similar in size and configuration compared to the previous exam. Vascular: Calcifications are present within the internal carotid arteries at the skullbase. Bilateral vertebral artery calcifications are also present. Skull: Negative for acute fracture or suspicious bone lesion. Mild hypoplasia of the head of the left mandible, similar compared to prior. Sinuses/Orbits: No acute finding. Other: None CT MAXILLOFACIAL FINDINGS Osseous: There is no mandibular fracture identified. Bilateral zygomatic arches and pterygoid plates appear intact. Minimally offset right nasal bone fracture identified without significant overlying soft tissue hematoma. Mastoid air cells are clear. Mild hypoplasia of the head of the left mandible as  before. Orbits: Intact and without evidence for displaced fracture. Extraocular muscles are symmetric. Sinuses: No acute fluid levels visualized. No significant mucosal thickening. Soft tissues: Mild right periorbital and supraorbital soft tissue swelling. Limited intracranial: Atrophy with periventricular white matter ischemic changes. CT CERVICAL SPINE FINDINGS Alignment: Minimal anterior listhesis of C5 on C6. Bony alignment otherwise within normal limits. Skull base and vertebrae: Craniovertebral junction is intact. Mastoid air cells are clear. Partial fusion posteriorly at C3-C4. Vertebral body heights are maintained. Soft tissues and spinal canal: Prevertebral soft tissue thickness is normal. Epiglottis is thin and normal. Thyroid gland slightly heterogeneous but without focal mass. No abnormally enlarged neck lymph nodes identified. No bony impingement of the spinal canal. There are bilateral carotid artery calcifications. Disc levels: Moderate narrowing at C6-C7, mild narrowing at C3-C4 with partial fusion posteriorly. Disc space calcification present at C2-C3 and C4-C5. Hypertrophic facet changes on the right at C2-C3 and C3-C4. Bilateral facet hypertrophy at C4-C5. Mild facet disease bilaterally at C5-C6. Small posterior disc osteophyte complex is present at C5-C6 and C6-C7 without significant bony canal compromise. Upper chest: There is biapical pleural-parenchymal scarring with calcified pleural plaques present within the bilateral lung apices. Focal soft tissue thickening with probable bronchiectasis in the apical segment of the right upper lobe, is favored to represent parenchymal scarring. Other: None IMPRESSION: 1. No definite CT evidence for acute intracranial abnormality 2. Interval finding of focal CSF density within the right periventricular white matter consistent with remote lacunar infarct, this is new since the previous CT scan from May 2017.  3. Extensive periventricular white matter  hypodensity, consistent with small vessel ischemic change. 4. Age indeterminate right nasal bone fracture, given the absence of overlying soft tissue swelling, favor old injury. Otherwise no acute facial bone fracture identified. Right periorbital soft tissue swelling. 5. No CT evidence for acute bony abnormality of the cervical spine. Multilevel degenerative disc disease as described above 6. Soft tissue thickening and pleural calcifications within the bilateral lung apices; this is favored to represent pleural parenchymal scarring. 7. Vascular calcifications Electronically Signed   By: Donavan Foil M.D.   On: 12/15/2015 18:53   Ct Maxillofacial Wo Contrast  Result Date: 12/15/2015 CLINICAL DATA:  Patient fell today while walking around the block. Loss of consciousness. Has bruising up of an under right eye. Patient states pain is in right side of head. EXAM: CT HEAD WITHOUT CONTRAST CT MAXILLOFACIAL WITHOUT CONTRAST CT CERVICAL SPINE WITHOUT CONTRAST TECHNIQUE: Multidetector CT imaging of the head, cervical spine, and maxillofacial structures were performed using the standard protocol without intravenous contrast. Multiplanar CT image reconstructions of the cervical spine and maxillofacial structures were also generated. COMPARISON:  MRI brain 07/12/2015, CT scan brain 07/12/2015, 05/12/2013. FINDINGS: CT HEAD FINDINGS Brain: There is no evidence for acute territorial infarction, acute intracranial hemorrhage, focal mass lesion, midline shift or mass effect. Extensive periventricular white matter hypodensities are again evident, consistent with small vessel ischemic disease. Interim finding of a focal CSF density within the right periventricular white matter, consistent with lacunar infarct, this appears old but is new since the prior CT scan 07/12/2015. Mild atrophy is again visualized. The ventricles are slightly enlarged but similar in size and configuration compared to the previous exam. Vascular:  Calcifications are present within the internal carotid arteries at the skullbase. Bilateral vertebral artery calcifications are also present. Skull: Negative for acute fracture or suspicious bone lesion. Mild hypoplasia of the head of the left mandible, similar compared to prior. Sinuses/Orbits: No acute finding. Other: None CT MAXILLOFACIAL FINDINGS Osseous: There is no mandibular fracture identified. Bilateral zygomatic arches and pterygoid plates appear intact. Minimally offset right nasal bone fracture identified without significant overlying soft tissue hematoma. Mastoid air cells are clear. Mild hypoplasia of the head of the left mandible as before. Orbits: Intact and without evidence for displaced fracture. Extraocular muscles are symmetric. Sinuses: No acute fluid levels visualized. No significant mucosal thickening. Soft tissues: Mild right periorbital and supraorbital soft tissue swelling. Limited intracranial: Atrophy with periventricular white matter ischemic changes. CT CERVICAL SPINE FINDINGS Alignment: Minimal anterior listhesis of C5 on C6. Bony alignment otherwise within normal limits. Skull base and vertebrae: Craniovertebral junction is intact. Mastoid air cells are clear. Partial fusion posteriorly at C3-C4. Vertebral body heights are maintained. Soft tissues and spinal canal: Prevertebral soft tissue thickness is normal. Epiglottis is thin and normal. Thyroid gland slightly heterogeneous but without focal mass. No abnormally enlarged neck lymph nodes identified. No bony impingement of the spinal canal. There are bilateral carotid artery calcifications. Disc levels: Moderate narrowing at C6-C7, mild narrowing at C3-C4 with partial fusion posteriorly. Disc space calcification present at C2-C3 and C4-C5. Hypertrophic facet changes on the right at C2-C3 and C3-C4. Bilateral facet hypertrophy at C4-C5. Mild facet disease bilaterally at C5-C6. Small posterior disc osteophyte complex is present at  C5-C6 and C6-C7 without significant bony canal compromise. Upper chest: There is biapical pleural-parenchymal scarring with calcified pleural plaques present within the bilateral lung apices. Focal soft tissue thickening with probable bronchiectasis in the apical segment of the right upper  lobe, is favored to represent parenchymal scarring. Other: None IMPRESSION: 1. No definite CT evidence for acute intracranial abnormality 2. Interval finding of focal CSF density within the right periventricular white matter consistent with remote lacunar infarct, this is new since the previous CT scan from May 2017. 3. Extensive periventricular white matter hypodensity, consistent with small vessel ischemic change. 4. Age indeterminate right nasal bone fracture, given the absence of overlying soft tissue swelling, favor old injury. Otherwise no acute facial bone fracture identified. Right periorbital soft tissue swelling. 5. No CT evidence for acute bony abnormality of the cervical spine. Multilevel degenerative disc disease as described above 6. Soft tissue thickening and pleural calcifications within the bilateral lung apices; this is favored to represent pleural parenchymal scarring. 7. Vascular calcifications Electronically Signed   By: Donavan Foil M.D.   On: 12/15/2015 18:53    Microbiology: Recent Results (from the past 240 hour(s))  Urine culture     Status: Abnormal   Collection Time: 12/15/15  5:30 PM  Result Value Ref Range Status   Specimen Description URINE, RANDOM  Final   Special Requests NONE  Final   Culture MULTIPLE SPECIES PRESENT, SUGGEST RECOLLECTION (A)  Final   Report Status 12/17/2015 FINAL  Final     Labs: Basic Metabolic Panel:  Recent Labs Lab 12/15/15 1726  NA 138  K 4.0  CL 106  CO2 19*  GLUCOSE 96  BUN 14  CREATININE 0.97  CALCIUM 9.9   Liver Function Tests: No results for input(s): AST, ALT, ALKPHOS, BILITOT, PROT, ALBUMIN in the last 168 hours. No results for  input(s): LIPASE, AMYLASE in the last 168 hours. No results for input(s): AMMONIA in the last 168 hours. CBC:  Recent Labs Lab 12/15/15 1726  WBC 7.8  NEUTROABS 3.9  HGB 14.4  HCT 43.9  MCV 95.2  PLT 292   Cardiac Enzymes:  Recent Labs Lab 12/15/15 1726 12/16/15 0151 12/16/15 0819 12/16/15 1428  CKTOTAL 72  --   --   --   TROPONINI  --  <0.03 <0.03 <0.03   BNP: BNP (last 3 results) No results for input(s): BNP in the last 8760 hours.  ProBNP (last 3 results) No results for input(s): PROBNP in the last 8760 hours.  CBG:  Recent Labs Lab 12/16/15 0530 12/17/15 0621  GLUCAP 90 94       Signed:  Edon Hoadley  [facility] 12/17/2015, 2:53 PM

## 2015-12-17 NOTE — Evaluation (Signed)
Occupational Therapy Evaluation Patient Details Name: Kathy Calhoun MRN: [id number] DOB: 09-18-1933 Today's Date: 12/17/2015    History of Present Illness 82 y.o. female with medical history significant of CVA March 2015 residual right-sided deficit., HTN, HLD presented with sudden fall 12/15/15. Patient was out walking her dog when she woke up on the pavement she reports right-sided head injury. She is unsure why she fell. Reports waking up on the ground denies tripping. No loss of bowel or bladder. No prior episodes like that. 2-3 months ago she had vertigo but no syncope   Clinical Impression   Pt admitted with fall in which she hit her head Pt currently with functional limitations due to the deficits listed below (see OT Problem List).  Pt will benefit from skilled OT to increase their safety and independence with ADL and functional mobility for ADL to facilitate discharge to venue listed below.      Follow Up Recommendations  Home health OT;Supervision/Assistance - 24 hour    Equipment Recommendations  None recommended by OT       Precautions / Restrictions Precautions Precautions: Fall      Mobility Bed Mobility               General bed mobility comments: pt in chair  Transfers Overall transfer level: Modified independent Equipment used: Straight cane Transfers: Stand Pivot Transfers;Sit to/from Stand Sit to Stand: Supervision Stand pivot transfers: Supervision;Min guard       General transfer comment: in and out of bathroom         ADL Overall ADL's : Needs assistance/impaired     Grooming: Min guard;Oral care;Standing       Lower Body Bathing: Minimal assistance;Sit to/from stand   Upper Body Dressing : Set up;Sitting   Lower Body Dressing: Minimal assistance;Sit to/from stand       Toileting- Water quality scientist and Hygiene: Minimal assistance;Sit to/from stand       Functional mobility during ADLs: Minimal assistance;Min  guard;Cueing for safety;Cueing for sequencing;Cane General ADL Comments: Pt overall S- min A with ADL activity- son lives with pt and will A as needed               Pertinent Vitals/Pain Pain Assessment: No/denies pain     Hand Dominance     Extremity/Trunk Assessment Upper Extremity Assessment Upper Extremity Assessment: RUE deficits/detail RUE Deficits / Details: Pt had CVA in 2015 but not able to use RUE for ADL activity- some FM deficits noted but pt able to brush teeth, manipulate toothbrush, etc           Communication Communication Communication: No difficulties   Cognition Arousal/Alertness: Awake/alert Behavior During Therapy: WFL for tasks assessed/performed Overall Cognitive Status: Within Functional Limits for tasks assessed                                Home Living Family/patient expects to be discharged to:: Private residence Living Arrangements: Alone Available Help at Discharge: Family;Available PRN/intermittently (son lives in town) Type of Home: House Home Access: Stairs to enter     Home Layout: Two level;Able to live on main level with bedroom/bathroom     Bathroom Shower/Tub: Occupational psychologist: Standard     Home Equipment: Cane - single point          Prior Functioning/Environment Level of Independence: Needs assistance  Gait / Transfers Assistance Needed: modified independent with cane;  does not drive ADL's / Homemaking Assistance Needed: modified independent             OT Problem List: Decreased strength   OT Treatment/Interventions: Self-care/ADL training;Patient/family education;DME and/or AE instruction    OT Goals(Current goals can be found in the care plan section) Acute Rehab OT Goals Patient Stated Goal: return to walking her dog OT Goal Formulation: With patient Time For Goal Achievement: 12/31/15 Potential to Achieve Goals: Good  OT Frequency: Min 2X/week              End of  Session Equipment Utilized During Treatment: Other (comment);Gait belt (cane) Nurse Communication: Mobility status  Activity Tolerance: Patient tolerated treatment well Patient left: in chair;with call bell/phone within reach   Time: 1217-1228 OT Time Calculation (min): 11 min Charges:  OT General Charges $OT Visit: 1 Procedure OT Evaluation $OT Eval Moderate Complexity: 1 Procedure G-Codes:    Payton Mccallum D 2015/12/23, 12:44 PM

## 2015-12-17 NOTE — Progress Notes (Signed)
Physical Therapy Treatment Patient Details Name: Kathy Calhoun MRN: [id number] DOB: [birth year] Today's Date: 12/17/2015    History of Present Illness 82 y.o. female with medical history significant of CVA March 2015 residual right-sided deficit., HTN, HLD presented with sudden fall 12/15/15. Patient was out walking her dog when she woke up on the pavement she reports right-sided head injury. She is unsure why she fell. Reports waking up on the ground denies tripping. No loss of bowel or bladder. No prior episodes like that. 2-3 months ago she had vertigo but no syncope    PT Comments    Patient reports she feels prepared to d/c home with son's intermittent assistance. She felt symptoms of vertigo "for a fraction of a second" with bed mobility (none while walking). BPPV testing negative (asymptomatic). Patient has had "much worse vertigo before" and has off/on had this mild vertigo. She knows how to modify her movements (movements to avoid) to avoid triggering vertigo.    Follow Up Recommendations  Home health PT     Equipment Recommendations  None recommended by PT    Recommendations for Other Services       Precautions / Restrictions Precautions Precautions: Fall    Mobility  Bed Mobility Overal bed mobility: Modified Independent             General bed mobility comments: HOB flat; sit to supine, supine to sit  Transfers Overall transfer level: Modified independent Equipment used: Straight cane Transfers: Stand Pivot Transfers;Sit to/from Stand Sit to Stand: Supervision Stand pivot transfers: Supervision;Min guard       General transfer comment: in and out of bathroom  Ambulation/Gait Ambulation/Gait assistance: Modified independent (Device/Increase time) Ambulation Distance (Feet): 10 Feet Assistive device: Straight cane       General Gait Details: cautious; denied vertigo   Stairs            Wheelchair Mobility    Modified Rankin (Stroke  Patients Only)       Balance     Sitting balance-Leahy Scale: Good       Standing balance-Leahy Scale: Fair                      Cognition Arousal/Alertness: Awake/alert Behavior During Therapy: WFL for tasks assessed/performed Overall Cognitive Status: Within Functional Limits for tasks assessed                      Exercises      General Comments General comments (skin integrity, edema, etc.): Assessed for vertigo with pt experiencing for "a fraction of a second" with sit to supine and supine to sit. Denied with rolling in bed. Rt sidelying test negative (no symptoms, no nystagmus). Patient verbalizes that she knows not to bend over towards floor or look up towards ceiling as these movements typically trigger her vertigo.       Pertinent Vitals/Pain Pain Assessment: No/denies pain    Home Living                      Prior Function            PT Goals (current goals can now be found in the care plan section) Acute Rehab PT Goals Patient Stated Goal: return to walking her dog Time For Goal Achievement: 12/23/15 Progress towards PT goals: Progressing toward goals    Frequency    Min 3X/week      PT Plan Current plan remains appropriate  Co-evaluation             End of Session   Activity Tolerance: Patient tolerated treatment well Patient left: with call bell/phone within reach;in bed     Time: [id number] PT Time Calculation (min) (ACUTE ONLY): 13 min  Charges:  $Therapeutic Activity: 8-22 mins                    G Codes:      Markee Matera 12-27-15, 4:39 PM Pager [phone]

## 2015-12-17 NOTE — Progress Notes (Signed)
Patient Name: Kathy Calhoun Date of Encounter: 12/17/2015  Primary Cardiologist: [facility] Problem List     Active Problems:   Essential hypertension, benign   Depression   Syncope and collapse   Bacteria in urine   Fall     Subjective   Feels well, no dizziness, no CP  Inpatient Medications    Scheduled Meds: . amLODipine  5 mg Oral Daily  . aspirin  325 mg Oral Daily  . atorvastatin  40 mg Oral q1800  . cefTRIAXone (ROCEPHIN) IVPB 1 gram/50 mL D5W  1 g Intravenous QHS  . DULoxetine  30 mg Oral Daily  . lisinopril  5 mg Oral Daily  . sodium chloride flush  3 mL Intravenous Q12H   Continuous Infusions:  PRN Meds:.acetaminophen   Vital Signs    Vitals:   12/16/15 0758 12/16/15 1511 12/16/15 1930 12/17/15 0640  BP: (!) 136/58 121/60 (!) 110/91 134/64  Pulse: 79 83 (!) 107 93  Resp: 18 18 18 18   Temp: 98 F (36.7 C) 98.7 F (37.1 C) 98.2 F (36.8 C) 98 F (36.7 C)  TempSrc: Oral Oral Oral Oral  SpO2: 98% 95% 96% 100%  Weight:    133 lb 12.8 oz (60.7 kg)  Height:        Intake/Output Summary (Last 24 hours) at 12/17/15 0843 Last data filed at 12/17/15 0641  Gross per 24 hour  Intake             1203 ml  Output             1951 ml  Net             -748 ml   Filed Weights   12/16/15 0014 12/16/15 0540 12/17/15 0640  Weight: 134 lb 3.2 oz (60.9 kg) 133 lb 11.2 oz (60.6 kg) 133 lb 12.8 oz (60.7 kg)    Physical Exam    GEN: Well nourished, well developed, in no acute distress.  HEENT: head laceration Neck: Supple, no JVD, carotid bruits, or masses. Cardiac: RRR, soft systolic murmur, no rubs, or gallops. No clubbing, cyanosis, edema.  Radials/DP/PT 2+ and equal bilaterally.  Respiratory:  Respirations regular and unlabored, clear to auscultation bilaterally. GI: Soft, nontender, nondistended, BS + x 4. MS: no deformity or atrophy. Skin: warm and dry, no rash. Neuro:  Strength and sensation are intact. Psych: AAOx3.  Normal affect.  Labs    CBC  Recent Labs  12/15/15 1726  WBC 7.8  NEUTROABS 3.9  HGB 14.4  HCT 43.9  MCV 95.2  PLT 123456   Basic Metabolic Panel  Recent Labs  12/15/15 1726  NA 138  K 4.0  CL 106  CO2 19*  GLUCOSE 96  BUN 14  CREATININE 0.97  CALCIUM 9.9   Liver Function Tests No results for input(s): AST, ALT, ALKPHOS, BILITOT, PROT, ALBUMIN in the last 72 hours. No results for input(s): LIPASE, AMYLASE in the last 72 hours. Cardiac Enzymes  Recent Labs  12/15/15 1726 12/16/15 0151 12/16/15 0819 12/16/15 1428  CKTOTAL 72  --   --   --   TROPONINI  --  <0.03 <0.03 <0.03   BNP Invalid input(s): POCBNP D-Dimer No results for input(s): DDIMER in the last 72 hours. Hemoglobin A1C  Recent Labs  12/16/15 0151  HGBA1C 5.6   Fasting Lipid Panel No results for input(s): CHOL, HDL, LDLCALC, TRIG, CHOLHDL, LDLDIRECT in the last 72 hours. Thyroid Function Tests No results for input(s): TSH,  T4TOTAL, T3FREE, THYROIDAB in the last 72 hours.  Invalid input(s): FREET3  Telemetry    NSR, mild sinus tachy  - Personally Reviewed  ECG    No adverse findings.  - Personally Reviewed  Radiology    Dg Chest 2 View  Result Date: 12/15/2015 CLINICAL DATA:  Fall today.  Initial encounter. EXAM: CHEST  2 VIEW COMPARISON:  05/12/2013 FINDINGS: The heart size and mediastinal contours are within normal limits. Stable scarring at both lung apices. There is no evidence of pulmonary edema, consolidation, pneumothorax, nodule or pleural fluid. No bony fractures are seen. Stable osteopenia of the spine. IMPRESSION: No active cardiopulmonary disease. Electronically Signed   By: Aletta Edouard M.D.   On: 12/15/2015 16:45   Ct Head Wo Contrast  Result Date: 12/15/2015 CLINICAL DATA:  Patient fell today while walking around the block. Loss of consciousness. Has bruising up of an under right eye. Patient states pain is in right side of head. EXAM: CT HEAD WITHOUT CONTRAST CT MAXILLOFACIAL WITHOUT CONTRAST  CT CERVICAL SPINE WITHOUT CONTRAST TECHNIQUE: Multidetector CT imaging of the head, cervical spine, and maxillofacial structures were performed using the standard protocol without intravenous contrast. Multiplanar CT image reconstructions of the cervical spine and maxillofacial structures were also generated. COMPARISON:  MRI brain 07/12/2015, CT scan brain 07/12/2015, 05/12/2013. FINDINGS: CT HEAD FINDINGS Brain: There is no evidence for acute territorial infarction, acute intracranial hemorrhage, focal mass lesion, midline shift or mass effect. Extensive periventricular white matter hypodensities are again evident, consistent with small vessel ischemic disease. Interim finding of a focal CSF density within the right periventricular white matter, consistent with lacunar infarct, this appears old but is new since the prior CT scan 07/12/2015. Mild atrophy is again visualized. The ventricles are slightly enlarged but similar in size and configuration compared to the previous exam. Vascular: Calcifications are present within the internal carotid arteries at the skullbase. Bilateral vertebral artery calcifications are also present. Skull: Negative for acute fracture or suspicious bone lesion. Mild hypoplasia of the head of the left mandible, similar compared to prior. Sinuses/Orbits: No acute finding. Other: None CT MAXILLOFACIAL FINDINGS Osseous: There is no mandibular fracture identified. Bilateral zygomatic arches and pterygoid plates appear intact. Minimally offset right nasal bone fracture identified without significant overlying soft tissue hematoma. Mastoid air cells are clear. Mild hypoplasia of the head of the left mandible as before. Orbits: Intact and without evidence for displaced fracture. Extraocular muscles are symmetric. Sinuses: No acute fluid levels visualized. No significant mucosal thickening. Soft tissues: Mild right periorbital and supraorbital soft tissue swelling. Limited intracranial: Atrophy  with periventricular white matter ischemic changes. CT CERVICAL SPINE FINDINGS Alignment: Minimal anterior listhesis of C5 on C6. Bony alignment otherwise within normal limits. Skull base and vertebrae: Craniovertebral junction is intact. Mastoid air cells are clear. Partial fusion posteriorly at C3-C4. Vertebral body heights are maintained. Soft tissues and spinal canal: Prevertebral soft tissue thickness is normal. Epiglottis is thin and normal. Thyroid gland slightly heterogeneous but without focal mass. No abnormally enlarged neck lymph nodes identified. No bony impingement of the spinal canal. There are bilateral carotid artery calcifications. Disc levels: Moderate narrowing at C6-C7, mild narrowing at C3-C4 with partial fusion posteriorly. Disc space calcification present at C2-C3 and C4-C5. Hypertrophic facet changes on the right at C2-C3 and C3-C4. Bilateral facet hypertrophy at C4-C5. Mild facet disease bilaterally at C5-C6. Small posterior disc osteophyte complex is present at C5-C6 and C6-C7 without significant bony canal compromise. Upper chest: There is biapical pleural-parenchymal  scarring with calcified pleural plaques present within the bilateral lung apices. Focal soft tissue thickening with probable bronchiectasis in the apical segment of the right upper lobe, is favored to represent parenchymal scarring. Other: None IMPRESSION: 1. No definite CT evidence for acute intracranial abnormality 2. Interval finding of focal CSF density within the right periventricular white matter consistent with remote lacunar infarct, this is new since the previous CT scan from May 2017. 3. Extensive periventricular white matter hypodensity, consistent with small vessel ischemic change. 4. Age indeterminate right nasal bone fracture, given the absence of overlying soft tissue swelling, favor old injury. Otherwise no acute facial bone fracture identified. Right periorbital soft tissue swelling. 5. No CT evidence for  acute bony abnormality of the cervical spine. Multilevel degenerative disc disease as described above 6. Soft tissue thickening and pleural calcifications within the bilateral lung apices; this is favored to represent pleural parenchymal scarring. 7. Vascular calcifications Electronically Signed   By: Donavan Foil M.D.   On: 12/15/2015 18:53   Ct Cervical Spine Wo Contrast  Result Date: 12/15/2015 CLINICAL DATA:  Patient fell today while walking around the block. Loss of consciousness. Has bruising up of an under right eye. Patient states pain is in right side of head. EXAM: CT HEAD WITHOUT CONTRAST CT MAXILLOFACIAL WITHOUT CONTRAST CT CERVICAL SPINE WITHOUT CONTRAST TECHNIQUE: Multidetector CT imaging of the head, cervical spine, and maxillofacial structures were performed using the standard protocol without intravenous contrast. Multiplanar CT image reconstructions of the cervical spine and maxillofacial structures were also generated. COMPARISON:  MRI brain 07/12/2015, CT scan brain 07/12/2015, 05/12/2013. FINDINGS: CT HEAD FINDINGS Brain: There is no evidence for acute territorial infarction, acute intracranial hemorrhage, focal mass lesion, midline shift or mass effect. Extensive periventricular white matter hypodensities are again evident, consistent with small vessel ischemic disease. Interim finding of a focal CSF density within the right periventricular white matter, consistent with lacunar infarct, this appears old but is new since the prior CT scan 07/12/2015. Mild atrophy is again visualized. The ventricles are slightly enlarged but similar in size and configuration compared to the previous exam. Vascular: Calcifications are present within the internal carotid arteries at the skullbase. Bilateral vertebral artery calcifications are also present. Skull: Negative for acute fracture or suspicious bone lesion. Mild hypoplasia of the head of the left mandible, similar compared to prior. Sinuses/Orbits:  No acute finding. Other: None CT MAXILLOFACIAL FINDINGS Osseous: There is no mandibular fracture identified. Bilateral zygomatic arches and pterygoid plates appear intact. Minimally offset right nasal bone fracture identified without significant overlying soft tissue hematoma. Mastoid air cells are clear. Mild hypoplasia of the head of the left mandible as before. Orbits: Intact and without evidence for displaced fracture. Extraocular muscles are symmetric. Sinuses: No acute fluid levels visualized. No significant mucosal thickening. Soft tissues: Mild right periorbital and supraorbital soft tissue swelling. Limited intracranial: Atrophy with periventricular white matter ischemic changes. CT CERVICAL SPINE FINDINGS Alignment: Minimal anterior listhesis of C5 on C6. Bony alignment otherwise within normal limits. Skull base and vertebrae: Craniovertebral junction is intact. Mastoid air cells are clear. Partial fusion posteriorly at C3-C4. Vertebral body heights are maintained. Soft tissues and spinal canal: Prevertebral soft tissue thickness is normal. Epiglottis is thin and normal. Thyroid gland slightly heterogeneous but without focal mass. No abnormally enlarged neck lymph nodes identified. No bony impingement of the spinal canal. There are bilateral carotid artery calcifications. Disc levels: Moderate narrowing at C6-C7, mild narrowing at C3-C4 with partial fusion posteriorly. Disc space calcification  present at C2-C3 and C4-C5. Hypertrophic facet changes on the right at C2-C3 and C3-C4. Bilateral facet hypertrophy at C4-C5. Mild facet disease bilaterally at C5-C6. Small posterior disc osteophyte complex is present at C5-C6 and C6-C7 without significant bony canal compromise. Upper chest: There is biapical pleural-parenchymal scarring with calcified pleural plaques present within the bilateral lung apices. Focal soft tissue thickening with probable bronchiectasis in the apical segment of the right upper lobe, is  favored to represent parenchymal scarring. Other: None IMPRESSION: 1. No definite CT evidence for acute intracranial abnormality 2. Interval finding of focal CSF density within the right periventricular white matter consistent with remote lacunar infarct, this is new since the previous CT scan from May 2017. 3. Extensive periventricular white matter hypodensity, consistent with small vessel ischemic change. 4. Age indeterminate right nasal bone fracture, given the absence of overlying soft tissue swelling, favor old injury. Otherwise no acute facial bone fracture identified. Right periorbital soft tissue swelling. 5. No CT evidence for acute bony abnormality of the cervical spine. Multilevel degenerative disc disease as described above 6. Soft tissue thickening and pleural calcifications within the bilateral lung apices; this is favored to represent pleural parenchymal scarring. 7. Vascular calcifications Electronically Signed   By: Donavan Foil M.D.   On: 12/15/2015 18:53   Ct Maxillofacial Wo Contrast  Result Date: 12/15/2015 CLINICAL DATA:  Patient fell today while walking around the block. Loss of consciousness. Has bruising up of an under right eye. Patient states pain is in right side of head. EXAM: CT HEAD WITHOUT CONTRAST CT MAXILLOFACIAL WITHOUT CONTRAST CT CERVICAL SPINE WITHOUT CONTRAST TECHNIQUE: Multidetector CT imaging of the head, cervical spine, and maxillofacial structures were performed using the standard protocol without intravenous contrast. Multiplanar CT image reconstructions of the cervical spine and maxillofacial structures were also generated. COMPARISON:  MRI brain 07/12/2015, CT scan brain 07/12/2015, 05/12/2013. FINDINGS: CT HEAD FINDINGS Brain: There is no evidence for acute territorial infarction, acute intracranial hemorrhage, focal mass lesion, midline shift or mass effect. Extensive periventricular white matter hypodensities are again evident, consistent with small vessel  ischemic disease. Interim finding of a focal CSF density within the right periventricular white matter, consistent with lacunar infarct, this appears old but is new since the prior CT scan 07/12/2015. Mild atrophy is again visualized. The ventricles are slightly enlarged but similar in size and configuration compared to the previous exam. Vascular: Calcifications are present within the internal carotid arteries at the skullbase. Bilateral vertebral artery calcifications are also present. Skull: Negative for acute fracture or suspicious bone lesion. Mild hypoplasia of the head of the left mandible, similar compared to prior. Sinuses/Orbits: No acute finding. Other: None CT MAXILLOFACIAL FINDINGS Osseous: There is no mandibular fracture identified. Bilateral zygomatic arches and pterygoid plates appear intact. Minimally offset right nasal bone fracture identified without significant overlying soft tissue hematoma. Mastoid air cells are clear. Mild hypoplasia of the head of the left mandible as before. Orbits: Intact and without evidence for displaced fracture. Extraocular muscles are symmetric. Sinuses: No acute fluid levels visualized. No significant mucosal thickening. Soft tissues: Mild right periorbital and supraorbital soft tissue swelling. Limited intracranial: Atrophy with periventricular white matter ischemic changes. CT CERVICAL SPINE FINDINGS Alignment: Minimal anterior listhesis of C5 on C6. Bony alignment otherwise within normal limits. Skull base and vertebrae: Craniovertebral junction is intact. Mastoid air cells are clear. Partial fusion posteriorly at C3-C4. Vertebral body heights are maintained. Soft tissues and spinal canal: Prevertebral soft tissue thickness is normal. Epiglottis is thin  and normal. Thyroid gland slightly heterogeneous but without focal mass. No abnormally enlarged neck lymph nodes identified. No bony impingement of the spinal canal. There are bilateral carotid artery  calcifications. Disc levels: Moderate narrowing at C6-C7, mild narrowing at C3-C4 with partial fusion posteriorly. Disc space calcification present at C2-C3 and C4-C5. Hypertrophic facet changes on the right at C2-C3 and C3-C4. Bilateral facet hypertrophy at C4-C5. Mild facet disease bilaterally at C5-C6. Small posterior disc osteophyte complex is present at C5-C6 and C6-C7 without significant bony canal compromise. Upper chest: There is biapical pleural-parenchymal scarring with calcified pleural plaques present within the bilateral lung apices. Focal soft tissue thickening with probable bronchiectasis in the apical segment of the right upper lobe, is favored to represent parenchymal scarring. Other: None IMPRESSION: 1. No definite CT evidence for acute intracranial abnormality 2. Interval finding of focal CSF density within the right periventricular white matter consistent with remote lacunar infarct, this is new since the previous CT scan from May 2017. 3. Extensive periventricular white matter hypodensity, consistent with small vessel ischemic change. 4. Age indeterminate right nasal bone fracture, given the absence of overlying soft tissue swelling, favor old injury. Otherwise no acute facial bone fracture identified. Right periorbital soft tissue swelling. 5. No CT evidence for acute bony abnormality of the cervical spine. Multilevel degenerative disc disease as described above 6. Soft tissue thickening and pleural calcifications within the bilateral lung apices; this is favored to represent pleural parenchymal scarring. 7. Vascular calcifications Electronically Signed   By: Donavan Foil M.D.   On: 12/15/2015 18:53     Cardiac Studies   Echo in 2015 with EF 55%   Patient Profile     82 year old with syncope while walking dog with no prodrome and no prior syncope. Prior CVA history with post CVA vertigo.    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   - Looking for possible sinus pause??  - ECG normal  - ECHO pending  - Aortic stenosis as cause doubtful (last ECHO with normal transvalvular gradient).   - ? Mechanical fall - but did not remember tripping.   - ? Transient vertigo causing fall  Would be OK with DC.  If ECHO extremely backed up, set up as outpatient.   Signed, Candee Furbish, MD  12/17/2015, 8:43 AM

## 2015-12-18 ENCOUNTER — Other Ambulatory Visit: Payer: Self-pay

## 2015-12-18 LAB — VAS US CAROTID
LCCADSYS: -88 cm/s
LCCAPDIAS: 27 cm/s
LCCAPSYS: 96 cm/s
LEFT ECA DIAS: -17 cm/s
LEFT VERTEBRAL DIAS: -16 cm/s
LICADDIAS: -30 cm/s
LICAPDIAS: -30 cm/s
Left CCA dist dias: -23 cm/s
Left ICA dist sys: -106 cm/s
Left ICA prox sys: -76 cm/s
RCCAPSYS: -78 cm/s
RIGHT ECA DIAS: -15 cm/s
RIGHT VERTEBRAL DIAS: 20 cm/s
Right CCA prox dias: -19 cm/s
Right cca dist sys: -127 cm/s

## 2015-12-20 DIAGNOSIS — I10 Essential (primary) hypertension: Secondary | ICD-10-CM | POA: Diagnosis not present

## 2015-12-20 DIAGNOSIS — Z7982 Long term (current) use of aspirin: Secondary | ICD-10-CM | POA: Diagnosis not present

## 2015-12-20 DIAGNOSIS — E785 Hyperlipidemia, unspecified: Secondary | ICD-10-CM | POA: Diagnosis not present

## 2015-12-20 DIAGNOSIS — Z9181 History of falling: Secondary | ICD-10-CM | POA: Diagnosis not present

## 2015-12-20 DIAGNOSIS — N390 Urinary tract infection, site not specified: Secondary | ICD-10-CM | POA: Diagnosis not present

## 2015-12-20 DIAGNOSIS — S0081XD Abrasion of other part of head, subsequent encounter: Secondary | ICD-10-CM | POA: Diagnosis not present

## 2015-12-20 DIAGNOSIS — I69351 Hemiplegia and hemiparesis following cerebral infarction affecting right dominant side: Secondary | ICD-10-CM | POA: Diagnosis not present

## 2015-12-20 DIAGNOSIS — F329 Major depressive disorder, single episode, unspecified: Secondary | ICD-10-CM | POA: Diagnosis not present

## 2015-12-20 DIAGNOSIS — M810 Age-related osteoporosis without current pathological fracture: Secondary | ICD-10-CM | POA: Diagnosis not present

## 2015-12-24 ENCOUNTER — Ambulatory Visit (INDEPENDENT_AMBULATORY_CARE_PROVIDER_SITE_OTHER): Payer: Medicare Other | Admitting: Cardiology

## 2015-12-24 ENCOUNTER — Ambulatory Visit: Payer: Medicare Other | Admitting: Cardiology

## 2015-12-24 ENCOUNTER — Encounter: Payer: Self-pay | Admitting: Cardiology

## 2015-12-24 VITALS — BP 120/78 | HR 82 | Ht 59.0 in | Wt 133.1 lb

## 2015-12-24 DIAGNOSIS — I69351 Hemiplegia and hemiparesis following cerebral infarction affecting right dominant side: Secondary | ICD-10-CM | POA: Diagnosis not present

## 2015-12-24 DIAGNOSIS — M810 Age-related osteoporosis without current pathological fracture: Secondary | ICD-10-CM | POA: Diagnosis not present

## 2015-12-24 DIAGNOSIS — R55 Syncope and collapse: Secondary | ICD-10-CM | POA: Diagnosis not present

## 2015-12-24 DIAGNOSIS — N390 Urinary tract infection, site not specified: Secondary | ICD-10-CM | POA: Diagnosis not present

## 2015-12-24 DIAGNOSIS — I10 Essential (primary) hypertension: Secondary | ICD-10-CM

## 2015-12-24 DIAGNOSIS — S0081XD Abrasion of other part of head, subsequent encounter: Secondary | ICD-10-CM | POA: Diagnosis not present

## 2015-12-24 DIAGNOSIS — I358 Other nonrheumatic aortic valve disorders: Secondary | ICD-10-CM

## 2015-12-24 DIAGNOSIS — R42 Dizziness and giddiness: Secondary | ICD-10-CM | POA: Diagnosis not present

## 2015-12-24 DIAGNOSIS — F329 Major depressive disorder, single episode, unspecified: Secondary | ICD-10-CM | POA: Diagnosis not present

## 2015-12-24 NOTE — Patient Instructions (Addendum)
Medication Instructions:  Your physician recommends that you continue on your current medications as directed. Please refer to the Current Medication list given to you today.  Labwork: None   Testing/Procedures: None   Follow-Up: Your physician recommends that you schedule a follow-up appointment in: 6 weeks with Dr Marlou Porch or Cecilie Kicks   Any Other Special Instructions Will Be Listed Below (If Applicable).  Patient is schedule on 12/29/2015 for an event monitor, Please schedule appt for the last week in November 2017.   If you need a refill on your cardiac medications before your next appointment, please call your pharmacy.

## 2015-12-24 NOTE — Progress Notes (Signed)
Cardiology Office Note   Date:  12/24/2015   ID:  CATALEA BOUVIA, DOB 1934-01-09, MRN [id number]  PCP:  Mathews Argyle, MD  Cardiologist:  Dr. Marlou Porch    Chief Complaint  Patient presents with  . Hospitalization Follow-up      History of Present Illness: Kathy Calhoun is a 82 y.o. female who presents for post hospitalization for syncope.    She has a hx of CVA 05/2013 with residual Rt dised deficit, HTN, HLD, stroke was felt to be small vessel disease.   No know CAD. Hx of nuc study 08/2013 with no ischemia and EF 64.  Echo in 2015 with EF 55% , G1DD, AV appeared trileaflet wit 1 cusp heavily calcified and immobile but could not rule out Bicuspid morphology.  PA pressure 80mmHg.   She was walking her dog after her usual Breakfast and woke up on ground.  No dizziness.  She felt "heavy headed" but walked home and then felt her normal self except pain near Rt eye that had hematoma and abrasion  Echo this admit with EF 55-60% G1DD.    Carotid dopplers  Findings consistent with 1-39 percent stenosis involving the right internal carotid artery and the left internal carotid artery.  She does not drive.   Today she has no complaints.  No chest pain.  occ dizziness but she has a hx of vertigo as well. Dizzy when bends if bends over, if she turns her head fast,  She had no problems with carotid doppler and pressure with that.  She is to received event monitor on the 18th.      Past Medical History:  Diagnosis Date  . Disc    bulging lumbar disc has had steriod injections  x3  . HTN (hypertension)   . Osteoporosis   . Stroke [facility])     Past Surgical History:  Procedure Laterality Date  . APPENDECTOMY    . CATARACT EXTRACTION     bilaterally 2011  . HYSTEROTOMY    . LAPAROTOMY     large benign mass removed 1993  . MOUTH SURGERY    . NASAL SEPTUM SURGERY    . OOPHORECTOMY    . TUBAL LIGATION       Current Outpatient Prescriptions  Medication Sig Dispense Refill  .  acetaminophen (TYLENOL) 500 MG tablet Take 1,000 mg by mouth every 6 (six) hours as needed (pain).     Marland Kitchen amLODipine (NORVASC) 5 MG tablet Take 5 mg by mouth daily.    Marland Kitchen aspirin 325 MG tablet Take 1 tablet (325 mg total) by mouth daily.    Marland Kitchen atorvastatin (LIPITOR) 40 MG tablet Take 40 mg by mouth daily.    . Calcium-Vitamin D (CALTRATE 600 PLUS-VIT D PO) Take 1 tablet by mouth once or twice daily    . diclofenac sodium (VOLTAREN) 1 % GEL Apply 2 g topically 4 (four) times daily as needed (pain).    . DULoxetine (CYMBALTA) 30 MG capsule Take 30 mg by mouth daily.    Marland Kitchen lisinopril (PRINIVIL,ZESTRIL) 5 MG tablet Take 5 mg by mouth daily.     . Multiple Vitamins-Minerals (MULTIVITAMIN WITH MINERALS) tablet Take 1 tablet by mouth daily.     No current facility-administered medications for this visit.     Allergies:   Codeine and Sulfa antibiotics    Social History:  The patient  reports that she has never smoked. She has never used smokeless tobacco. She reports that she does not drink  alcohol or use drugs.   Family History:  The patient's family history includes Cancer in her father; Dementia in her brother and sister.    ROS:  General:no colds or fevers, no weight changes Skin:no rashes or ulcers HEENT:no blurred vision, no congestion CV:see HPI PUL:see HPI GI:no diarrhea constipation or melena, no indigestion GU:no hematuria, no dysuria MS:no joint pain, no claudication Neuro:no syncope, + lightheadedness Endo:no diabetes, no thyroid disease  Wt Readings from Last 3 Encounters:  12/24/15 133 lb 1.9 oz (60.4 kg)  12/17/15 133 lb 12.8 oz (60.7 kg)  06/24/15 133 lb 12.8 oz (60.7 kg)     PHYSICAL EXAM: VS:  BP 120/78   Pulse 82   Ht 4\' 11"  (1.499 m)   Wt 133 lb 1.9 oz (60.4 kg)   SpO2 98%   BMI 26.89 kg/m  , BMI Body mass index is 26.89 kg/m. General:Pleasant affect, NAD Skin:Warm and dry, brisk capillary refill HEENT:normocephalic, sclera clear, mucus membranes  moist Neck:supple, no JVD, no bruits  Heart:S1S2 RRR with 2/6 systolic murmur, no gallup, rub or click Lungs:clear without rales, rhonchi, or wheezes [id number], non tender, + BS, do not palpate liver spleen or masses Ext:no lower ext edema, 2+ pedal pulses, 2+ radial pulses Neuro:alert and oriented X 3, MAE, follows commands, + facial symmetry    EKG:  EKG is NOT ordered today.    Recent Labs: 07/12/2015: ALT 18 12/15/2015: BUN 14; Creatinine, Ser 0.97; Hemoglobin 14.4; Platelets 292; Potassium 4.0; Sodium 138    Lipid Panel    Component Value Date/Time   CHOL 259 (H) 05/13/2013 0440   TRIG 178 (H) 05/13/2013 0440   HDL 75 05/13/2013 0440   CHOLHDL 3.5 05/13/2013 0440   VLDL 36 05/13/2013 0440   LDLCALC 148 (H) 05/13/2013 0440       Other studies Reviewed: Additional studies/ records that were reviewed today include: .  ECHO: Study Conclusions  - Left ventricle: The cavity size was normal. Wall thickness was   normal. Systolic function was normal. The estimated ejection   fraction was in the range of 55% to 60%. Doppler parameters are   consistent with abnormal left ventricular relaxation (grade 1   diastolic dysfunction). - Aortic valve: Cannot tell if AV is tri-leaflet Moderately   calcified but no apparent stenosis although CW doppler is   suboptimal. - Mitral valve: Calcified annulus. - Atrial septum: No defect or patent foramen ovale was identified.  ASSESSMENT AND PLAN:  1.  Syncope stable currently, she will have 30 day event monitor and follow up with Dr. Marlou Porch or myself.    2. Aortic murmur with sclerosis not stenosis  3.  Hx of Vertigo.  4. Essential HTN   Current medicines are reviewed with the patient today.  The patient Has no concerns regarding medicines.  The following changes have been made:  See above Labs/ tests ordered today include:see above  Disposition:   FU:  see above  Signed, Cecilie Kicks, NP  12/24/2015 3:49 PM    [facility] [place], [place], [place], [place] Phone: [phone]; Fax: [phone]

## 2015-12-28 DIAGNOSIS — I10 Essential (primary) hypertension: Secondary | ICD-10-CM | POA: Diagnosis not present

## 2015-12-28 DIAGNOSIS — I69351 Hemiplegia and hemiparesis following cerebral infarction affecting right dominant side: Secondary | ICD-10-CM | POA: Diagnosis not present

## 2015-12-28 DIAGNOSIS — M810 Age-related osteoporosis without current pathological fracture: Secondary | ICD-10-CM | POA: Diagnosis not present

## 2015-12-28 DIAGNOSIS — S0081XD Abrasion of other part of head, subsequent encounter: Secondary | ICD-10-CM | POA: Diagnosis not present

## 2015-12-28 DIAGNOSIS — F329 Major depressive disorder, single episode, unspecified: Secondary | ICD-10-CM | POA: Diagnosis not present

## 2015-12-28 DIAGNOSIS — N390 Urinary tract infection, site not specified: Secondary | ICD-10-CM | POA: Diagnosis not present

## 2015-12-29 ENCOUNTER — Ambulatory Visit (INDEPENDENT_AMBULATORY_CARE_PROVIDER_SITE_OTHER): Payer: Medicare Other

## 2015-12-29 DIAGNOSIS — R55 Syncope and collapse: Secondary | ICD-10-CM

## 2015-12-29 DIAGNOSIS — I10 Essential (primary) hypertension: Secondary | ICD-10-CM | POA: Diagnosis not present

## 2015-12-30 DIAGNOSIS — M810 Age-related osteoporosis without current pathological fracture: Secondary | ICD-10-CM | POA: Diagnosis not present

## 2015-12-30 DIAGNOSIS — I69351 Hemiplegia and hemiparesis following cerebral infarction affecting right dominant side: Secondary | ICD-10-CM | POA: Diagnosis not present

## 2015-12-30 DIAGNOSIS — S0081XD Abrasion of other part of head, subsequent encounter: Secondary | ICD-10-CM | POA: Diagnosis not present

## 2015-12-30 DIAGNOSIS — I10 Essential (primary) hypertension: Secondary | ICD-10-CM | POA: Diagnosis not present

## 2015-12-30 DIAGNOSIS — N390 Urinary tract infection, site not specified: Secondary | ICD-10-CM | POA: Diagnosis not present

## 2015-12-30 DIAGNOSIS — F329 Major depressive disorder, single episode, unspecified: Secondary | ICD-10-CM | POA: Diagnosis not present

## 2016-01-05 DIAGNOSIS — S0081XD Abrasion of other part of head, subsequent encounter: Secondary | ICD-10-CM | POA: Diagnosis not present

## 2016-01-05 DIAGNOSIS — I10 Essential (primary) hypertension: Secondary | ICD-10-CM | POA: Diagnosis not present

## 2016-01-05 DIAGNOSIS — N390 Urinary tract infection, site not specified: Secondary | ICD-10-CM | POA: Diagnosis not present

## 2016-01-05 DIAGNOSIS — M810 Age-related osteoporosis without current pathological fracture: Secondary | ICD-10-CM | POA: Diagnosis not present

## 2016-01-05 DIAGNOSIS — F329 Major depressive disorder, single episode, unspecified: Secondary | ICD-10-CM | POA: Diagnosis not present

## 2016-01-05 DIAGNOSIS — I69351 Hemiplegia and hemiparesis following cerebral infarction affecting right dominant side: Secondary | ICD-10-CM | POA: Diagnosis not present

## 2016-01-07 DIAGNOSIS — F329 Major depressive disorder, single episode, unspecified: Secondary | ICD-10-CM | POA: Diagnosis not present

## 2016-01-07 DIAGNOSIS — M810 Age-related osteoporosis without current pathological fracture: Secondary | ICD-10-CM | POA: Diagnosis not present

## 2016-01-07 DIAGNOSIS — S0081XD Abrasion of other part of head, subsequent encounter: Secondary | ICD-10-CM | POA: Diagnosis not present

## 2016-01-07 DIAGNOSIS — N390 Urinary tract infection, site not specified: Secondary | ICD-10-CM | POA: Diagnosis not present

## 2016-01-07 DIAGNOSIS — I69351 Hemiplegia and hemiparesis following cerebral infarction affecting right dominant side: Secondary | ICD-10-CM | POA: Diagnosis not present

## 2016-01-07 DIAGNOSIS — I10 Essential (primary) hypertension: Secondary | ICD-10-CM | POA: Diagnosis not present

## 2016-01-12 DIAGNOSIS — M810 Age-related osteoporosis without current pathological fracture: Secondary | ICD-10-CM | POA: Diagnosis not present

## 2016-01-12 DIAGNOSIS — F329 Major depressive disorder, single episode, unspecified: Secondary | ICD-10-CM | POA: Diagnosis not present

## 2016-01-12 DIAGNOSIS — I10 Essential (primary) hypertension: Secondary | ICD-10-CM | POA: Diagnosis not present

## 2016-01-12 DIAGNOSIS — N390 Urinary tract infection, site not specified: Secondary | ICD-10-CM | POA: Diagnosis not present

## 2016-01-12 DIAGNOSIS — S0081XD Abrasion of other part of head, subsequent encounter: Secondary | ICD-10-CM | POA: Diagnosis not present

## 2016-01-12 DIAGNOSIS — I69351 Hemiplegia and hemiparesis following cerebral infarction affecting right dominant side: Secondary | ICD-10-CM | POA: Diagnosis not present

## 2016-01-19 DIAGNOSIS — F329 Major depressive disorder, single episode, unspecified: Secondary | ICD-10-CM | POA: Diagnosis not present

## 2016-01-19 DIAGNOSIS — M810 Age-related osteoporosis without current pathological fracture: Secondary | ICD-10-CM | POA: Diagnosis not present

## 2016-01-19 DIAGNOSIS — I69351 Hemiplegia and hemiparesis following cerebral infarction affecting right dominant side: Secondary | ICD-10-CM | POA: Diagnosis not present

## 2016-01-19 DIAGNOSIS — N390 Urinary tract infection, site not specified: Secondary | ICD-10-CM | POA: Diagnosis not present

## 2016-01-19 DIAGNOSIS — S0081XD Abrasion of other part of head, subsequent encounter: Secondary | ICD-10-CM | POA: Diagnosis not present

## 2016-01-19 DIAGNOSIS — I10 Essential (primary) hypertension: Secondary | ICD-10-CM | POA: Diagnosis not present

## 2016-01-24 ENCOUNTER — Other Ambulatory Visit: Payer: Self-pay | Admitting: Geriatric Medicine

## 2016-01-24 DIAGNOSIS — Z1231 Encounter for screening mammogram for malignant neoplasm of breast: Secondary | ICD-10-CM

## 2016-01-26 DIAGNOSIS — F329 Major depressive disorder, single episode, unspecified: Secondary | ICD-10-CM | POA: Diagnosis not present

## 2016-01-26 DIAGNOSIS — S0081XD Abrasion of other part of head, subsequent encounter: Secondary | ICD-10-CM | POA: Diagnosis not present

## 2016-01-26 DIAGNOSIS — I69351 Hemiplegia and hemiparesis following cerebral infarction affecting right dominant side: Secondary | ICD-10-CM | POA: Diagnosis not present

## 2016-01-26 DIAGNOSIS — M810 Age-related osteoporosis without current pathological fracture: Secondary | ICD-10-CM | POA: Diagnosis not present

## 2016-01-26 DIAGNOSIS — I10 Essential (primary) hypertension: Secondary | ICD-10-CM | POA: Diagnosis not present

## 2016-01-26 DIAGNOSIS — N390 Urinary tract infection, site not specified: Secondary | ICD-10-CM | POA: Diagnosis not present

## 2016-02-01 DIAGNOSIS — I69351 Hemiplegia and hemiparesis following cerebral infarction affecting right dominant side: Secondary | ICD-10-CM | POA: Diagnosis not present

## 2016-02-01 DIAGNOSIS — F329 Major depressive disorder, single episode, unspecified: Secondary | ICD-10-CM | POA: Diagnosis not present

## 2016-02-01 DIAGNOSIS — N390 Urinary tract infection, site not specified: Secondary | ICD-10-CM | POA: Diagnosis not present

## 2016-02-01 DIAGNOSIS — I10 Essential (primary) hypertension: Secondary | ICD-10-CM | POA: Diagnosis not present

## 2016-02-01 DIAGNOSIS — M810 Age-related osteoporosis without current pathological fracture: Secondary | ICD-10-CM | POA: Diagnosis not present

## 2016-02-01 DIAGNOSIS — S0081XD Abrasion of other part of head, subsequent encounter: Secondary | ICD-10-CM | POA: Diagnosis not present

## 2016-02-02 ENCOUNTER — Telehealth: Payer: Self-pay | Admitting: *Deleted

## 2016-02-02 NOTE — Telephone Encounter (Signed)
-----   Message from Jerline Pain, MD sent at 02/02/2016  3:37 PM EST -----  No atrial fibrillation, no pauses  No adverse arrhythmias  Rare PVCs/PACs   Reassuring monitor Candee Furbish, MD

## 2016-02-02 NOTE — Telephone Encounter (Signed)
Patient informed. 

## 2016-02-08 DIAGNOSIS — N390 Urinary tract infection, site not specified: Secondary | ICD-10-CM | POA: Diagnosis not present

## 2016-02-08 DIAGNOSIS — F329 Major depressive disorder, single episode, unspecified: Secondary | ICD-10-CM | POA: Diagnosis not present

## 2016-02-08 DIAGNOSIS — I10 Essential (primary) hypertension: Secondary | ICD-10-CM | POA: Diagnosis not present

## 2016-02-08 DIAGNOSIS — S0081XD Abrasion of other part of head, subsequent encounter: Secondary | ICD-10-CM | POA: Diagnosis not present

## 2016-02-08 DIAGNOSIS — I69351 Hemiplegia and hemiparesis following cerebral infarction affecting right dominant side: Secondary | ICD-10-CM | POA: Diagnosis not present

## 2016-02-08 DIAGNOSIS — M810 Age-related osteoporosis without current pathological fracture: Secondary | ICD-10-CM | POA: Diagnosis not present

## 2016-02-09 ENCOUNTER — Encounter: Payer: Self-pay | Admitting: Cardiology

## 2016-02-09 ENCOUNTER — Encounter (INDEPENDENT_AMBULATORY_CARE_PROVIDER_SITE_OTHER): Payer: Self-pay

## 2016-02-09 ENCOUNTER — Ambulatory Visit (INDEPENDENT_AMBULATORY_CARE_PROVIDER_SITE_OTHER): Payer: Medicare Other | Admitting: Cardiology

## 2016-02-09 VITALS — BP 136/82 | HR 88 | Ht 59.0 in | Wt 133.2 lb

## 2016-02-09 DIAGNOSIS — I358 Other nonrheumatic aortic valve disorders: Secondary | ICD-10-CM

## 2016-02-09 DIAGNOSIS — R42 Dizziness and giddiness: Secondary | ICD-10-CM | POA: Diagnosis not present

## 2016-02-09 DIAGNOSIS — R55 Syncope and collapse: Secondary | ICD-10-CM

## 2016-02-09 DIAGNOSIS — I10 Essential (primary) hypertension: Secondary | ICD-10-CM | POA: Diagnosis not present

## 2016-02-09 MED ORDER — ASPIRIN 81 MG PO TABS: 81.0000 mg | ORAL_TABLET | Freq: Every day | ORAL | Status: DC

## 2016-02-09 NOTE — Patient Instructions (Signed)
Medication Instructions:  1) DECREASE ASPIRIN to 81 mg daily  Labwork: None  Testing/Procedures: None  Follow-Up: Your physician recommends that you schedule a follow-up appointment AS NEEDED with Dr. Marlou Porch.  Any Other Special Instructions Will Be Listed Below (If Applicable).     If you need a refill on your cardiac medications before your next appointment, please call your pharmacy.

## 2016-02-09 NOTE — Progress Notes (Signed)
Cardiology Office Note    Date:  02/09/2016   ID:  Kathy Calhoun, DOB 07/07/1933, MRN [id number]  PCP:  Mathews Argyle, MD  Cardiologist:   Candee Furbish, MD     History of Present Illness:  Kathy Calhoun is a 82 y.o. female with syncopal episode in October 2017 while walking her dog, felt "heavy headed ", woke up on ground. No significant prodrome.  Echocardiogram showed normal ejection fraction, grade 1 diastolic dysfunction. Carotid Dopplers showed mild plaque.  30 day event monitor 12/29/15:  No atrial fibrillation, no pauses  No adverse arrhythmias  Rare PVCs/PACs  Reassuring monitor  She has not had any further falls or syncopal episodes. Using a cane. No chest pain, no shortness of breath. Very appreciative of Dr. Felipa Eth.  Past Medical History:  Diagnosis Date  . Disc    bulging lumbar disc has had steriod injections  x3  . HTN (hypertension)   . Osteoporosis   . Stroke [facility])     Past Surgical History:  Procedure Laterality Date  . APPENDECTOMY    . CATARACT EXTRACTION     bilaterally 2011  . HYSTEROTOMY    . LAPAROTOMY     large benign mass removed 1993  . MOUTH SURGERY    . NASAL SEPTUM SURGERY    . OOPHORECTOMY    . TUBAL LIGATION      Current Medications: Outpatient Medications Prior to Visit  Medication Sig Dispense Refill  . acetaminophen (TYLENOL) 500 MG tablet Take 1,000 mg by mouth every 6 (six) hours as needed (pain).     Marland Kitchen amLODipine (NORVASC) 5 MG tablet Take 5 mg by mouth daily.    Marland Kitchen atorvastatin (LIPITOR) 40 MG tablet Take 40 mg by mouth daily.    . Calcium-Vitamin D (CALTRATE 600 PLUS-VIT D PO) Take 1 tablet by mouth once or twice daily    . diclofenac sodium (VOLTAREN) 1 % GEL Apply 2 g topically 4 (four) times daily as needed (pain).    . DULoxetine (CYMBALTA) 30 MG capsule Take 30 mg by mouth daily.    Marland Kitchen lisinopril (PRINIVIL,ZESTRIL) 5 MG tablet Take 5 mg by mouth daily.     . Multiple Vitamins-Minerals (MULTIVITAMIN  WITH MINERALS) tablet Take 1 tablet by mouth daily.    Marland Kitchen aspirin 325 MG tablet Take 1 tablet (325 mg total) by mouth daily.     No facility-administered medications prior to visit.      Allergies:   Codeine and Sulfa antibiotics   Social History   Social History  . Marital status: Widowed    Spouse name: N/A  . Number of children: 4  . Years of education: college   Occupational History  . CNA    Social History Main Topics  . Smoking status: Never Smoker  . Smokeless tobacco: Never Used  . Alcohol use No  . Drug use: No  . Sexual activity: No   Other Topics Concern  . None   Social History Narrative   Patient lives at home alone   Patient right handed   Patient drinks coffee (1-2 cups) avg.     Family History:  The patient's family history includes Cancer in her father; Dementia in her brother and sister.   ROS:   Please see the history of present illness.    ROS All other systems reviewed and are negative.   PHYSICAL EXAM:   VS:  BP 136/82   Pulse 88   Ht 4\' 11"  (1.499  m)   Wt 133 lb 3.2 oz (60.4 kg)   LMP  (LMP Unknown)   BMI 26.90 kg/m    GEN: Well nourished, well developed, in no acute distress  HEENT: normal  Neck: no JVD, carotid bruits, or masses Cardiac: RRR; no murmurs, rubs, or gallops,no edema  Respiratory:  clear to auscultation bilaterally, normal work of breathing GI: soft, nontender, nondistended, + BS MS: no deformity or atrophy  Skin: warm and dry, no rash Neuro:  Alert and Oriented x 3, Strength and sensation are intact Psych: euthymic mood, full affect  Wt Readings from Last 3 Encounters:  02/09/16 133 lb 3.2 oz (60.4 kg)  12/24/15 133 lb 1.9 oz (60.4 kg)  12/17/15 133 lb 12.8 oz (60.7 kg)      Studies/Labs Reviewed:   EKG:  EKG is not ordered today.  Prior EKG reviewed.  Recent Labs: 07/12/2015: ALT 18 12/15/2015: BUN 14; Creatinine, Ser 0.97; Hemoglobin 14.4; Platelets 292; Potassium 4.0; Sodium 138   Lipid Panel      Component Value Date/Time   CHOL 259 (H) 05/13/2013 0440   TRIG 178 (H) 05/13/2013 0440   HDL 75 05/13/2013 0440   CHOLHDL 3.5 05/13/2013 0440   VLDL 36 05/13/2013 0440   LDLCALC 148 (H) 05/13/2013 0440    Additional studies/ records that were reviewed today include:  Prior office notes reviewed, event monitor, blood work, EKG, echocardiogram reviewed    ASSESSMENT:    1. Syncope, unspecified syncope type   2. Aortic heart murmur   3. Essential hypertension   4. Vertigo      PLAN:  In order of problems listed above:  Syncope  - No obvious cardiac etiology.  - Reassuring monitor. Reassuring EKG. Reassuring echocardiogram.  - No further workup.  - She does not drive to begin with.  - She may move aspirin 81 mg.  Prior history of stroke  - Secondary prevention.  Aortic sclerosis-no stenosis  Essential hypertension  - Continue to treat medications reviewed  History of vertigo  - Wonder if vertigo/ataxia occurred and she fell and did not actually lose consciousness?  Overall reassuring workup.  Medication Adjustments/Labs and Tests Ordered: Current medicines are reviewed at length with the patient today.  Concerns regarding medicines are outlined above.  Medication changes, Labs and Tests ordered today are listed in the Patient Instructions below. Patient Instructions  Medication Instructions:  1) DECREASE ASPIRIN to 81 mg daily  Labwork: None  Testing/Procedures: None  Follow-Up: Your physician recommends that you schedule a follow-up appointment AS NEEDED with Dr. Marlou Porch.  Any Other Special Instructions Will Be Listed Below (If Applicable).     If you need a refill on your cardiac medications before your next appointment, please call your pharmacy.      Signed, Candee Furbish, MD  02/09/2016 3:51 PM    [facility] [place], [place],   [address] Phone: [phone]; Fax: [phone]

## 2016-02-15 DIAGNOSIS — S0081XD Abrasion of other part of head, subsequent encounter: Secondary | ICD-10-CM | POA: Diagnosis not present

## 2016-02-15 DIAGNOSIS — F329 Major depressive disorder, single episode, unspecified: Secondary | ICD-10-CM | POA: Diagnosis not present

## 2016-02-15 DIAGNOSIS — M810 Age-related osteoporosis without current pathological fracture: Secondary | ICD-10-CM | POA: Diagnosis not present

## 2016-02-15 DIAGNOSIS — I10 Essential (primary) hypertension: Secondary | ICD-10-CM | POA: Diagnosis not present

## 2016-02-15 DIAGNOSIS — N390 Urinary tract infection, site not specified: Secondary | ICD-10-CM | POA: Diagnosis not present

## 2016-02-15 DIAGNOSIS — I69351 Hemiplegia and hemiparesis following cerebral infarction affecting right dominant side: Secondary | ICD-10-CM | POA: Diagnosis not present

## 2016-02-29 ENCOUNTER — Ambulatory Visit
Admission: RE | Admit: 2016-02-29 | Discharge: 2016-02-29 | Disposition: A | Discharge status: Home / Self Care | Payer: Medicare Other | Source: Ambulatory Visit | Attending: Geriatric Medicine | Admitting: Geriatric Medicine

## 2016-02-29 DIAGNOSIS — Z1231 Encounter for screening mammogram for malignant neoplasm of breast: Secondary | ICD-10-CM | POA: Diagnosis not present

## 2016-04-26 ENCOUNTER — Ambulatory Visit (INDEPENDENT_AMBULATORY_CARE_PROVIDER_SITE_OTHER): Payer: Medicare Other | Admitting: Podiatry

## 2016-04-26 ENCOUNTER — Encounter: Payer: Self-pay | Admitting: Podiatry

## 2016-04-26 VITALS — BP 131/68 | HR 87 | Resp 16 | Ht 59.0 in | Wt 130.0 lb

## 2016-04-26 DIAGNOSIS — L600 Ingrowing nail: Secondary | ICD-10-CM | POA: Diagnosis not present

## 2016-04-26 NOTE — Progress Notes (Signed)
   Subjective:    Patient ID: Kathy Calhoun, female    DOB: 05-09-1933, 83 y.o.   MRN: [id number]  HPI Chief Complaint  Patient presents with  . Nail Problem    Bilateral; 2nd toes-both sides; pt stated, "Wants to have nails checked"; x2+ months      Review of Systems  All other systems reviewed and are negative.      Objective:   Physical Exam        Assessment & Plan:

## 2016-04-26 NOTE — Patient Instructions (Signed)

## 2016-04-27 NOTE — Progress Notes (Signed)
Subjective:     Patient ID: Kathy Calhoun, female   DOB: Jul 23, 1933, 83 y.o.   MRN: [id number]  HPI patient states that her second nails on both feet are increasingly hard for her to cut thick and painful when pressed. States she's tried wider shoes and tried to trim them without relief   Review of Systems  All other systems reviewed and are negative.      Objective:   Physical Exam  Constitutional: She is oriented to person, place, and time.  Cardiovascular: Intact distal pulses.   Musculoskeletal: Normal range of motion.  Neurological: She is oriented to person, place, and time.  Skin: Skin is warm.  Nursing note and vitals reviewed.  Neurovascular status intact muscle strength adequate range of motion within normal limits with patient found to have thick dystrophic second nails bilateral that are painful when pressed and makes shoe gear difficult. She's tried to trim them herself she has tried to wear wider shoes without relief of symptoms and is found to have good digital perfusion and is well oriented 3     Assessment:     Damaged second nails bilateral with thick dystrophic changes    Plan:     H&P conditions reviewed and recommended nail removal. At this time I explained procedure and risk and today I infiltrated the  second digit bilateral 60 Milligan times like Marcaine mixture removed the nails exposed matrix and applied for applications 30 seconds phenol followed by alcohol lavage and sterile dressing. Gave instructions on soaks and reappoint

## 2016-05-12 ENCOUNTER — Ambulatory Visit (INDEPENDENT_AMBULATORY_CARE_PROVIDER_SITE_OTHER): Payer: Self-pay | Admitting: Podiatry

## 2016-05-12 ENCOUNTER — Encounter: Payer: Self-pay | Admitting: Podiatry

## 2016-05-12 DIAGNOSIS — L600 Ingrowing nail: Secondary | ICD-10-CM

## 2016-05-14 NOTE — Progress Notes (Signed)
Subjective:     Patient ID: Kathy Calhoun, female   DOB: Oct 30, 1933, 83 y.o.   MRN: [id number]  HPI patient presents concerned that her nailbeds may be infected   Review of Systems     Objective:   Physical Exam Neurovascular status intact with crusted areas hallux bilateral with slight drainage left over right but no proximal edema erythema or drainage noted currently    Assessment:     Possibility for localized paronychia infection hallux bilateral    Plan:     Reviewed with patient and at this point I've recommended continued soaking and changing the type of soaks she does along with bandage usage during the day patient will be seen back to recheck

## 2016-06-19 DIAGNOSIS — G8929 Other chronic pain: Secondary | ICD-10-CM | POA: Diagnosis not present

## 2016-06-19 DIAGNOSIS — Z79899 Other long term (current) drug therapy: Secondary | ICD-10-CM | POA: Diagnosis not present

## 2016-06-19 DIAGNOSIS — E7800 Pure hypercholesterolemia, unspecified: Secondary | ICD-10-CM | POA: Diagnosis not present

## 2016-06-19 DIAGNOSIS — Z0000 Encounter for general adult medical examination without abnormal findings: Secondary | ICD-10-CM | POA: Diagnosis not present

## 2016-06-19 DIAGNOSIS — Z780 Asymptomatic menopausal state: Secondary | ICD-10-CM | POA: Diagnosis not present

## 2016-06-19 DIAGNOSIS — I10 Essential (primary) hypertension: Secondary | ICD-10-CM | POA: Diagnosis not present

## 2016-06-19 DIAGNOSIS — Z1389 Encounter for screening for other disorder: Secondary | ICD-10-CM | POA: Diagnosis not present

## 2016-06-19 DIAGNOSIS — L989 Disorder of the skin and subcutaneous tissue, unspecified: Secondary | ICD-10-CM | POA: Diagnosis not present

## 2016-06-19 DIAGNOSIS — M545 Low back pain: Secondary | ICD-10-CM | POA: Diagnosis not present

## 2016-06-20 DIAGNOSIS — I10 Essential (primary) hypertension: Secondary | ICD-10-CM | POA: Diagnosis not present

## 2016-07-11 DIAGNOSIS — L821 Other seborrheic keratosis: Secondary | ICD-10-CM | POA: Diagnosis not present

## 2016-07-11 DIAGNOSIS — L570 Actinic keratosis: Secondary | ICD-10-CM | POA: Diagnosis not present

## 2016-07-20 DIAGNOSIS — Z780 Asymptomatic menopausal state: Secondary | ICD-10-CM | POA: Diagnosis not present

## 2016-07-20 DIAGNOSIS — M810 Age-related osteoporosis without current pathological fracture: Secondary | ICD-10-CM | POA: Diagnosis not present

## 2016-08-22 DIAGNOSIS — L989 Disorder of the skin and subcutaneous tissue, unspecified: Secondary | ICD-10-CM | POA: Diagnosis not present

## 2016-08-22 DIAGNOSIS — I10 Essential (primary) hypertension: Secondary | ICD-10-CM | POA: Diagnosis not present

## 2016-08-22 DIAGNOSIS — M79642 Pain in left hand: Secondary | ICD-10-CM | POA: Diagnosis not present

## 2016-08-22 DIAGNOSIS — M810 Age-related osteoporosis without current pathological fracture: Secondary | ICD-10-CM | POA: Diagnosis not present

## 2016-09-04 DIAGNOSIS — L570 Actinic keratosis: Secondary | ICD-10-CM | POA: Diagnosis not present

## 2016-10-02 DIAGNOSIS — M1812 Unilateral primary osteoarthritis of first carpometacarpal joint, left hand: Secondary | ICD-10-CM | POA: Diagnosis not present

## 2016-10-02 DIAGNOSIS — M19132 Post-traumatic osteoarthritis, left wrist: Secondary | ICD-10-CM | POA: Insufficient documentation

## 2016-10-20 DIAGNOSIS — S81802A Unspecified open wound, left lower leg, initial encounter: Secondary | ICD-10-CM | POA: Diagnosis not present

## 2016-10-20 DIAGNOSIS — B9689 Other specified bacterial agents as the cause of diseases classified elsewhere: Secondary | ICD-10-CM | POA: Diagnosis not present

## 2016-10-20 DIAGNOSIS — L089 Local infection of the skin and subcutaneous tissue, unspecified: Secondary | ICD-10-CM | POA: Diagnosis not present

## 2016-10-20 DIAGNOSIS — Z23 Encounter for immunization: Secondary | ICD-10-CM | POA: Diagnosis not present

## 2016-10-20 DIAGNOSIS — W548XXA Other contact with dog, initial encounter: Secondary | ICD-10-CM | POA: Diagnosis not present

## 2016-10-30 DIAGNOSIS — T148XXA Other injury of unspecified body region, initial encounter: Secondary | ICD-10-CM | POA: Diagnosis not present

## 2016-10-30 DIAGNOSIS — G8191 Hemiplegia, unspecified affecting right dominant side: Secondary | ICD-10-CM | POA: Diagnosis not present

## 2016-10-30 DIAGNOSIS — L089 Local infection of the skin and subcutaneous tissue, unspecified: Secondary | ICD-10-CM | POA: Diagnosis not present

## 2016-11-28 DIAGNOSIS — C44729 Squamous cell carcinoma of skin of left lower limb, including hip: Secondary | ICD-10-CM | POA: Diagnosis not present

## 2016-11-28 DIAGNOSIS — L578 Other skin changes due to chronic exposure to nonionizing radiation: Secondary | ICD-10-CM | POA: Diagnosis not present

## 2016-11-28 DIAGNOSIS — L821 Other seborrheic keratosis: Secondary | ICD-10-CM | POA: Diagnosis not present

## 2016-11-28 DIAGNOSIS — L820 Inflamed seborrheic keratosis: Secondary | ICD-10-CM | POA: Diagnosis not present

## 2016-12-21 DIAGNOSIS — C44729 Squamous cell carcinoma of skin of left lower limb, including hip: Secondary | ICD-10-CM | POA: Diagnosis not present

## 2016-12-21 DIAGNOSIS — Z85828 Personal history of other malignant neoplasm of skin: Secondary | ICD-10-CM | POA: Diagnosis not present

## 2017-01-04 DIAGNOSIS — Z23 Encounter for immunization: Secondary | ICD-10-CM | POA: Diagnosis not present

## 2017-02-20 ENCOUNTER — Other Ambulatory Visit: Payer: Self-pay | Admitting: Geriatric Medicine

## 2017-02-20 DIAGNOSIS — Z1231 Encounter for screening mammogram for malignant neoplasm of breast: Secondary | ICD-10-CM

## 2017-02-27 DIAGNOSIS — I10 Essential (primary) hypertension: Secondary | ICD-10-CM | POA: Diagnosis not present

## 2017-02-27 DIAGNOSIS — R42 Dizziness and giddiness: Secondary | ICD-10-CM | POA: Diagnosis not present

## 2017-03-21 ENCOUNTER — Ambulatory Visit: Payer: Medicare Other

## 2017-05-04 ENCOUNTER — Ambulatory Visit (INDEPENDENT_AMBULATORY_CARE_PROVIDER_SITE_OTHER): Payer: Medicare Other | Admitting: Podiatry

## 2017-05-04 ENCOUNTER — Encounter: Payer: Self-pay | Admitting: Podiatry

## 2017-05-04 DIAGNOSIS — L600 Ingrowing nail: Secondary | ICD-10-CM

## 2017-05-06 NOTE — Progress Notes (Signed)
Subjective:   Patient ID: Kathy Calhoun, female   DOB: 84 y.o.   MRN: [id number]   HPI Patient is noted to have thickened nails second bilateral and she was concerned about this   ROS      Objective:  Physical Exam  Neurovascular status intact with thick and second nails bilateral that are dystrophic and not painful but she was concerned     Assessment:  Removal of second nails which had occurred with thick scab tissue which has formed     Plan:  Sterile debridement of the tissue accomplished with no iatrogenic bleeding and patient will be seen back to recheck for this to be done

## 2017-05-11 ENCOUNTER — Ambulatory Visit
Admission: RE | Admit: 2017-05-11 | Discharge: 2017-05-11 | Disposition: A | Discharge status: Home / Self Care | Payer: Medicare Other | Source: Ambulatory Visit | Attending: Geriatric Medicine | Admitting: Geriatric Medicine

## 2017-05-11 DIAGNOSIS — Z1231 Encounter for screening mammogram for malignant neoplasm of breast: Secondary | ICD-10-CM | POA: Diagnosis not present

## 2017-08-10 DIAGNOSIS — I10 Essential (primary) hypertension: Secondary | ICD-10-CM | POA: Diagnosis not present

## 2017-08-10 DIAGNOSIS — Z79899 Other long term (current) drug therapy: Secondary | ICD-10-CM | POA: Diagnosis not present

## 2017-08-10 DIAGNOSIS — E7800 Pure hypercholesterolemia, unspecified: Secondary | ICD-10-CM | POA: Diagnosis not present

## 2017-08-10 DIAGNOSIS — Z1389 Encounter for screening for other disorder: Secondary | ICD-10-CM | POA: Diagnosis not present

## 2017-08-10 DIAGNOSIS — M810 Age-related osteoporosis without current pathological fracture: Secondary | ICD-10-CM | POA: Diagnosis not present

## 2017-08-10 DIAGNOSIS — I639 Cerebral infarction, unspecified: Secondary | ICD-10-CM | POA: Diagnosis not present

## 2017-08-10 DIAGNOSIS — Z0000 Encounter for general adult medical examination without abnormal findings: Secondary | ICD-10-CM | POA: Diagnosis not present

## 2017-08-13 DIAGNOSIS — I10 Essential (primary) hypertension: Secondary | ICD-10-CM | POA: Diagnosis not present

## 2017-10-15 DIAGNOSIS — H04123 Dry eye syndrome of bilateral lacrimal glands: Secondary | ICD-10-CM | POA: Diagnosis not present

## 2017-10-15 DIAGNOSIS — Z961 Presence of intraocular lens: Secondary | ICD-10-CM | POA: Diagnosis not present

## 2017-10-15 DIAGNOSIS — H52203 Unspecified astigmatism, bilateral: Secondary | ICD-10-CM | POA: Diagnosis not present

## 2018-01-01 DIAGNOSIS — Z23 Encounter for immunization: Secondary | ICD-10-CM | POA: Diagnosis not present

## 2018-02-12 DIAGNOSIS — I10 Essential (primary) hypertension: Secondary | ICD-10-CM | POA: Diagnosis not present

## 2018-02-12 DIAGNOSIS — I872 Venous insufficiency (chronic) (peripheral): Secondary | ICD-10-CM | POA: Diagnosis not present

## 2018-02-12 DIAGNOSIS — Z79899 Other long term (current) drug therapy: Secondary | ICD-10-CM | POA: Diagnosis not present

## 2018-05-09 DIAGNOSIS — J069 Acute upper respiratory infection, unspecified: Secondary | ICD-10-CM | POA: Diagnosis not present

## 2018-08-16 DIAGNOSIS — Z1389 Encounter for screening for other disorder: Secondary | ICD-10-CM | POA: Diagnosis not present

## 2018-08-16 DIAGNOSIS — E7800 Pure hypercholesterolemia, unspecified: Secondary | ICD-10-CM | POA: Diagnosis not present

## 2018-08-16 DIAGNOSIS — Z79899 Other long term (current) drug therapy: Secondary | ICD-10-CM | POA: Diagnosis not present

## 2018-08-16 DIAGNOSIS — Z0000 Encounter for general adult medical examination without abnormal findings: Secondary | ICD-10-CM | POA: Diagnosis not present

## 2018-08-16 DIAGNOSIS — I10 Essential (primary) hypertension: Secondary | ICD-10-CM | POA: Diagnosis not present

## 2018-08-20 DIAGNOSIS — E7800 Pure hypercholesterolemia, unspecified: Secondary | ICD-10-CM | POA: Diagnosis not present

## 2018-08-20 DIAGNOSIS — Z79899 Other long term (current) drug therapy: Secondary | ICD-10-CM | POA: Diagnosis not present

## 2018-12-11 DIAGNOSIS — Z23 Encounter for immunization: Secondary | ICD-10-CM | POA: Diagnosis not present

## 2018-12-13 ENCOUNTER — Other Ambulatory Visit: Payer: Self-pay | Admitting: Geriatric Medicine

## 2018-12-13 DIAGNOSIS — Z1231 Encounter for screening mammogram for malignant neoplasm of breast: Secondary | ICD-10-CM

## 2019-01-02 DIAGNOSIS — H18593 Other hereditary corneal dystrophies, bilateral: Secondary | ICD-10-CM | POA: Diagnosis not present

## 2019-01-02 DIAGNOSIS — H5201 Hypermetropia, right eye: Secondary | ICD-10-CM | POA: Diagnosis not present

## 2019-01-02 DIAGNOSIS — H04123 Dry eye syndrome of bilateral lacrimal glands: Secondary | ICD-10-CM | POA: Diagnosis not present

## 2019-01-29 ENCOUNTER — Ambulatory Visit: Payer: Medicare Other

## 2019-02-03 ENCOUNTER — Ambulatory Visit
Admission: RE | Admit: 2019-02-03 | Discharge: 2019-02-03 | Disposition: A | Discharge status: Home / Self Care | Payer: Medicare Other | Source: Ambulatory Visit | Attending: Geriatric Medicine | Admitting: Geriatric Medicine

## 2019-02-03 ENCOUNTER — Other Ambulatory Visit: Payer: Self-pay

## 2019-02-03 DIAGNOSIS — Z1231 Encounter for screening mammogram for malignant neoplasm of breast: Secondary | ICD-10-CM | POA: Diagnosis not present

## 2019-03-28 DIAGNOSIS — Z23 Encounter for immunization: Secondary | ICD-10-CM | POA: Diagnosis not present

## 2019-04-23 DIAGNOSIS — Z23 Encounter for immunization: Secondary | ICD-10-CM | POA: Diagnosis not present

## 2019-08-20 DIAGNOSIS — E7800 Pure hypercholesterolemia, unspecified: Secondary | ICD-10-CM | POA: Diagnosis not present

## 2019-08-20 DIAGNOSIS — Z1389 Encounter for screening for other disorder: Secondary | ICD-10-CM | POA: Diagnosis not present

## 2019-08-20 DIAGNOSIS — M810 Age-related osteoporosis without current pathological fracture: Secondary | ICD-10-CM | POA: Diagnosis not present

## 2019-08-20 DIAGNOSIS — F3342 Major depressive disorder, recurrent, in full remission: Secondary | ICD-10-CM | POA: Diagnosis not present

## 2019-08-20 DIAGNOSIS — Z79899 Other long term (current) drug therapy: Secondary | ICD-10-CM | POA: Diagnosis not present

## 2019-08-20 DIAGNOSIS — I10 Essential (primary) hypertension: Secondary | ICD-10-CM | POA: Diagnosis not present

## 2019-08-20 DIAGNOSIS — Z0000 Encounter for general adult medical examination without abnormal findings: Secondary | ICD-10-CM | POA: Diagnosis not present

## 2019-11-25 DIAGNOSIS — I10 Essential (primary) hypertension: Secondary | ICD-10-CM | POA: Diagnosis not present

## 2019-11-25 DIAGNOSIS — Z23 Encounter for immunization: Secondary | ICD-10-CM | POA: Diagnosis not present

## 2019-11-25 DIAGNOSIS — L03115 Cellulitis of right lower limb: Secondary | ICD-10-CM | POA: Diagnosis not present

## 2019-12-04 DIAGNOSIS — L03115 Cellulitis of right lower limb: Secondary | ICD-10-CM | POA: Diagnosis not present

## 2019-12-23 DIAGNOSIS — H00015 Hordeolum externum left lower eyelid: Secondary | ICD-10-CM | POA: Diagnosis not present

## 2019-12-23 DIAGNOSIS — H00012 Hordeolum externum right lower eyelid: Secondary | ICD-10-CM | POA: Diagnosis not present

## 2020-01-12 DIAGNOSIS — H04123 Dry eye syndrome of bilateral lacrimal glands: Secondary | ICD-10-CM | POA: Diagnosis not present

## 2020-01-12 DIAGNOSIS — Z23 Encounter for immunization: Secondary | ICD-10-CM | POA: Diagnosis not present

## 2020-01-12 DIAGNOSIS — H18593 Other hereditary corneal dystrophies, bilateral: Secondary | ICD-10-CM | POA: Diagnosis not present

## 2020-01-12 DIAGNOSIS — H5201 Hypermetropia, right eye: Secondary | ICD-10-CM | POA: Diagnosis not present

## 2020-02-27 DIAGNOSIS — Z8673 Personal history of transient ischemic attack (TIA), and cerebral infarction without residual deficits: Secondary | ICD-10-CM | POA: Diagnosis not present

## 2020-02-27 DIAGNOSIS — J069 Acute upper respiratory infection, unspecified: Secondary | ICD-10-CM | POA: Diagnosis not present

## 2020-02-27 DIAGNOSIS — R29818 Other symptoms and signs involving the nervous system: Secondary | ICD-10-CM | POA: Diagnosis not present

## 2020-02-27 DIAGNOSIS — Z90710 Acquired absence of both cervix and uterus: Secondary | ICD-10-CM | POA: Diagnosis not present

## 2020-02-27 DIAGNOSIS — K449 Diaphragmatic hernia without obstruction or gangrene: Secondary | ICD-10-CM | POA: Diagnosis not present

## 2020-02-27 DIAGNOSIS — Z9049 Acquired absence of other specified parts of digestive tract: Secondary | ICD-10-CM | POA: Diagnosis not present

## 2020-02-27 DIAGNOSIS — E7800 Pure hypercholesterolemia, unspecified: Secondary | ICD-10-CM | POA: Diagnosis not present

## 2020-02-27 DIAGNOSIS — J439 Emphysema, unspecified: Secondary | ICD-10-CM | POA: Diagnosis not present

## 2020-02-27 DIAGNOSIS — Z79899 Other long term (current) drug therapy: Secondary | ICD-10-CM | POA: Diagnosis not present

## 2020-02-27 DIAGNOSIS — R42 Dizziness and giddiness: Secondary | ICD-10-CM | POA: Diagnosis not present

## 2020-02-27 DIAGNOSIS — R059 Cough, unspecified: Secondary | ICD-10-CM | POA: Diagnosis not present

## 2020-06-16 DIAGNOSIS — Z9049 Acquired absence of other specified parts of digestive tract: Secondary | ICD-10-CM | POA: Diagnosis not present

## 2020-06-16 DIAGNOSIS — Z79899 Other long term (current) drug therapy: Secondary | ICD-10-CM | POA: Diagnosis not present

## 2020-06-16 DIAGNOSIS — S61452A Open bite of left hand, initial encounter: Secondary | ICD-10-CM | POA: Diagnosis not present

## 2020-06-16 DIAGNOSIS — Z90710 Acquired absence of both cervix and uterus: Secondary | ICD-10-CM | POA: Diagnosis not present

## 2020-06-16 DIAGNOSIS — I10 Essential (primary) hypertension: Secondary | ICD-10-CM | POA: Diagnosis not present

## 2020-06-16 DIAGNOSIS — Z8673 Personal history of transient ischemic attack (TIA), and cerebral infarction without residual deficits: Secondary | ICD-10-CM | POA: Diagnosis not present

## 2020-06-17 DIAGNOSIS — I10 Essential (primary) hypertension: Secondary | ICD-10-CM | POA: Diagnosis present

## 2020-06-17 DIAGNOSIS — E7800 Pure hypercholesterolemia, unspecified: Secondary | ICD-10-CM | POA: Diagnosis present

## 2020-06-17 DIAGNOSIS — M7989 Other specified soft tissue disorders: Secondary | ICD-10-CM | POA: Diagnosis not present

## 2020-06-17 DIAGNOSIS — M792 Neuralgia and neuritis, unspecified: Secondary | ICD-10-CM | POA: Diagnosis not present

## 2020-06-17 DIAGNOSIS — S61452A Open bite of left hand, initial encounter: Secondary | ICD-10-CM | POA: Diagnosis present

## 2020-06-17 DIAGNOSIS — L089 Local infection of the skin and subcutaneous tissue, unspecified: Secondary | ICD-10-CM | POA: Diagnosis present

## 2020-06-30 ENCOUNTER — Other Ambulatory Visit: Payer: Self-pay | Admitting: Geriatric Medicine

## 2020-06-30 DIAGNOSIS — R1319 Other dysphagia: Secondary | ICD-10-CM

## 2020-06-30 DIAGNOSIS — I10 Essential (primary) hypertension: Secondary | ICD-10-CM | POA: Diagnosis not present

## 2020-06-30 DIAGNOSIS — S61452D Open bite of left hand, subsequent encounter: Secondary | ICD-10-CM | POA: Diagnosis not present

## 2020-06-30 DIAGNOSIS — W5501XD Bitten by cat, subsequent encounter: Secondary | ICD-10-CM | POA: Diagnosis not present

## 2020-06-30 DIAGNOSIS — R197 Diarrhea, unspecified: Secondary | ICD-10-CM | POA: Diagnosis not present

## 2020-07-22 ENCOUNTER — Other Ambulatory Visit: Payer: Medicare Other

## 2020-08-13 ENCOUNTER — Ambulatory Visit
Admission: RE | Admit: 2020-08-13 | Discharge: 2020-08-13 | Disposition: A | Discharge status: Home / Self Care | Payer: Medicare Other | Source: Ambulatory Visit | Attending: Geriatric Medicine | Admitting: Geriatric Medicine

## 2020-08-13 ENCOUNTER — Other Ambulatory Visit: Payer: Self-pay

## 2020-08-13 DIAGNOSIS — R1310 Dysphagia, unspecified: Secondary | ICD-10-CM | POA: Diagnosis not present

## 2020-08-13 DIAGNOSIS — R1319 Other dysphagia: Secondary | ICD-10-CM

## 2020-08-31 DIAGNOSIS — I10 Essential (primary) hypertension: Secondary | ICD-10-CM | POA: Diagnosis not present

## 2020-08-31 DIAGNOSIS — K449 Diaphragmatic hernia without obstruction or gangrene: Secondary | ICD-10-CM | POA: Diagnosis not present

## 2020-08-31 DIAGNOSIS — K224 Dyskinesia of esophagus: Secondary | ICD-10-CM | POA: Diagnosis not present

## 2020-08-31 DIAGNOSIS — Z1389 Encounter for screening for other disorder: Secondary | ICD-10-CM | POA: Diagnosis not present

## 2020-08-31 DIAGNOSIS — K219 Gastro-esophageal reflux disease without esophagitis: Secondary | ICD-10-CM | POA: Diagnosis not present

## 2020-08-31 DIAGNOSIS — Z0000 Encounter for general adult medical examination without abnormal findings: Secondary | ICD-10-CM | POA: Diagnosis not present

## 2020-08-31 DIAGNOSIS — Z79899 Other long term (current) drug therapy: Secondary | ICD-10-CM | POA: Diagnosis not present

## 2020-09-06 DIAGNOSIS — L03116 Cellulitis of left lower limb: Secondary | ICD-10-CM | POA: Diagnosis not present

## 2020-10-23 DIAGNOSIS — R059 Cough, unspecified: Secondary | ICD-10-CM | POA: Diagnosis not present

## 2020-10-23 DIAGNOSIS — R509 Fever, unspecified: Secondary | ICD-10-CM | POA: Diagnosis not present

## 2020-10-23 DIAGNOSIS — U071 COVID-19: Secondary | ICD-10-CM | POA: Diagnosis not present

## 2020-10-25 DIAGNOSIS — U071 COVID-19: Secondary | ICD-10-CM | POA: Diagnosis not present

## 2020-12-20 DIAGNOSIS — Z79899 Other long term (current) drug therapy: Secondary | ICD-10-CM | POA: Diagnosis not present

## 2020-12-20 DIAGNOSIS — Z8673 Personal history of transient ischemic attack (TIA), and cerebral infarction without residual deficits: Secondary | ICD-10-CM | POA: Diagnosis not present

## 2020-12-20 DIAGNOSIS — N611 Abscess of the breast and nipple: Secondary | ICD-10-CM | POA: Diagnosis not present

## 2020-12-30 DIAGNOSIS — L821 Other seborrheic keratosis: Secondary | ICD-10-CM | POA: Diagnosis not present

## 2020-12-30 DIAGNOSIS — N611 Abscess of the breast and nipple: Secondary | ICD-10-CM | POA: Diagnosis not present

## 2021-01-10 DIAGNOSIS — N611 Abscess of the breast and nipple: Secondary | ICD-10-CM | POA: Diagnosis not present

## 2021-01-10 DIAGNOSIS — L570 Actinic keratosis: Secondary | ICD-10-CM | POA: Diagnosis not present

## 2021-01-10 DIAGNOSIS — L304 Erythema intertrigo: Secondary | ICD-10-CM | POA: Diagnosis not present

## 2021-01-10 DIAGNOSIS — L853 Xerosis cutis: Secondary | ICD-10-CM | POA: Diagnosis not present

## 2021-01-10 DIAGNOSIS — Z23 Encounter for immunization: Secondary | ICD-10-CM | POA: Diagnosis not present

## 2021-01-18 DIAGNOSIS — N611 Abscess of the breast and nipple: Secondary | ICD-10-CM | POA: Diagnosis not present

## 2021-01-18 DIAGNOSIS — L304 Erythema intertrigo: Secondary | ICD-10-CM | POA: Diagnosis not present

## 2021-01-18 DIAGNOSIS — L989 Disorder of the skin and subcutaneous tissue, unspecified: Secondary | ICD-10-CM | POA: Diagnosis not present

## 2021-01-27 DIAGNOSIS — L821 Other seborrheic keratosis: Secondary | ICD-10-CM | POA: Diagnosis not present

## 2021-01-27 DIAGNOSIS — B354 Tinea corporis: Secondary | ICD-10-CM | POA: Diagnosis not present

## 2021-02-16 DIAGNOSIS — R21 Rash and other nonspecific skin eruption: Secondary | ICD-10-CM | POA: Diagnosis not present

## 2021-02-16 DIAGNOSIS — L570 Actinic keratosis: Secondary | ICD-10-CM | POA: Diagnosis not present

## 2021-02-16 DIAGNOSIS — B354 Tinea corporis: Secondary | ICD-10-CM | POA: Diagnosis not present

## 2021-02-16 DIAGNOSIS — K224 Dyskinesia of esophagus: Secondary | ICD-10-CM | POA: Diagnosis not present

## 2021-03-16 DIAGNOSIS — R1312 Dysphagia, oropharyngeal phase: Secondary | ICD-10-CM | POA: Diagnosis not present

## 2021-03-16 DIAGNOSIS — L989 Disorder of the skin and subcutaneous tissue, unspecified: Secondary | ICD-10-CM | POA: Diagnosis not present

## 2021-03-16 DIAGNOSIS — I10 Essential (primary) hypertension: Secondary | ICD-10-CM | POA: Diagnosis not present

## 2021-03-18 ENCOUNTER — Other Ambulatory Visit (HOSPITAL_COMMUNITY): Payer: Self-pay

## 2021-03-18 DIAGNOSIS — R059 Cough, unspecified: Secondary | ICD-10-CM

## 2021-03-18 DIAGNOSIS — R1310 Dysphagia, unspecified: Secondary | ICD-10-CM

## 2021-03-24 ENCOUNTER — Ambulatory Visit (HOSPITAL_COMMUNITY)
Admission: RE | Admit: 2021-03-24 | Discharge: 2021-03-24 | Disposition: A | Discharge status: Home / Self Care | Payer: Medicare Other | Source: Ambulatory Visit | Attending: Geriatric Medicine | Admitting: Geriatric Medicine

## 2021-03-24 ENCOUNTER — Other Ambulatory Visit: Payer: Self-pay

## 2021-03-24 DIAGNOSIS — R059 Cough, unspecified: Secondary | ICD-10-CM | POA: Insufficient documentation

## 2021-03-24 DIAGNOSIS — R1310 Dysphagia, unspecified: Secondary | ICD-10-CM | POA: Insufficient documentation

## 2021-03-24 DIAGNOSIS — L570 Actinic keratosis: Secondary | ICD-10-CM | POA: Diagnosis not present

## 2021-03-24 DIAGNOSIS — R1312 Dysphagia, oropharyngeal phase: Secondary | ICD-10-CM | POA: Insufficient documentation

## 2021-03-24 DIAGNOSIS — D485 Neoplasm of uncertain behavior of skin: Secondary | ICD-10-CM | POA: Diagnosis not present

## 2021-03-24 DIAGNOSIS — Z85828 Personal history of other malignant neoplasm of skin: Secondary | ICD-10-CM | POA: Diagnosis not present

## 2021-03-24 NOTE — Progress Notes (Signed)
Objective Swallowing Evaluation: Type of Study: MBS-Modified Barium Swallow Study   Patient Details  Name: Kathy Calhoun MRN: [id number] Date of Birth: 08-14-1933  Today's Date: 03/24/2021 Time: SLP Start Time (ACUTE ONLY): 36 -SLP Stop Time (ACUTE ONLY): 1200  SLP Time Calculation (min) (ACUTE ONLY): 30 min   Past Medical History:  Past Medical History:  Diagnosis Date   Disc    bulging lumbar disc has had steriod injections  x3   HTN (hypertension)    Osteoporosis    Stroke [facility])    Past Surgical History:  Past Surgical History:  Procedure Laterality Date   APPENDECTOMY     CATARACT EXTRACTION     bilaterally 2011   HYSTEROTOMY     LAPAROTOMY     large benign mass removed 1993   MOUTH SURGERY     NASAL SEPTUM SURGERY     OOPHORECTOMY     TUBAL LIGATION     HPI: Kathy Calhoun is an energetic 88 y.o. female with PMHx left CVA who was referred for an OP MBS due to ongoing c/o difficulty swallowing, primarily related to globus, early satiety and known esophageal dismotility.  Barium esophagram 08/13/20 revealed:  Pharyngeal phase of swallowing normal. Normal function. No aspiration. Poor esophageal motility with multiple tertiary contractions. No focal esophageal stricture or mass. Large sliding hiatal hernia. Mild gastroesophageal reflux while drinking water.   Subjective: alert    Recommendations for follow up therapy are one component of a multi-disciplinary discharge planning process, led by the attending physician.  Recommendations may be updated based on patient status, additional functional criteria and insurance authorization.  Assessment / Plan / Recommendation  Clinical Impressions 03/24/2021  Clinical Impression Pt presents with primary esophageal dysphagia - oropharyngeal function is normal with adequate oral phase, normal timing of the pharyngeal swallow, good pharyngeal clearance, occasional high penetration of thin liquids into the larynx (PAS 2), considered normal  with no aspiration.  The 13 mm barium pill lodged briefly in valleculae, requiring applesauce for clearance. Kathy Calhoun viewed the study in real time; we discussed results and recommendations to help deal with the symptoms of esophageal dysmotility.  Printed strategies given to her and reviewed.  No SLP f/u is needed. Continue regular solids, concentrating on moist/softer foods.  SLP Visit Diagnosis Dysphagia, pharyngoesophageal phase (R13.14)  Attention and concentration deficit following --  Frontal lobe and executive function deficit following --  Impact on safety and function No limitations      No flowsheet data found.   No flowsheet data found.  Diet Recommendations 03/24/2021  SLP Diet Recommendations Regular solids;Thin liquid  Liquid Administration via Cup;Straw  Medication Administration Crushed with puree  Compensations --  Postural Changes Remain semi-upright after after feeds/meals (Comment)      Other Recommendations 03/24/2021  Recommended Consults --  Oral Care Recommendations --  Other Recommendations --  Follow Up Recommendations No SLP follow up  Assistance recommended at discharge --  Functional Status Assessment --             No flowsheet data found.   Kathy Calhoun 03/24/2021, 12:40 PM Kathy Calhoun, [place] Office number [phone] Pager [phone]

## 2021-03-30 DIAGNOSIS — M810 Age-related osteoporosis without current pathological fracture: Secondary | ICD-10-CM | POA: Diagnosis not present

## 2021-03-30 DIAGNOSIS — I10 Essential (primary) hypertension: Secondary | ICD-10-CM | POA: Diagnosis not present

## 2021-04-05 ENCOUNTER — Other Ambulatory Visit: Payer: Self-pay | Admitting: Geriatric Medicine

## 2021-04-05 DIAGNOSIS — M810 Age-related osteoporosis without current pathological fracture: Secondary | ICD-10-CM

## 2021-04-08 DIAGNOSIS — G459 Transient cerebral ischemic attack, unspecified: Secondary | ICD-10-CM | POA: Diagnosis not present

## 2021-04-08 DIAGNOSIS — I10 Essential (primary) hypertension: Secondary | ICD-10-CM | POA: Diagnosis not present

## 2021-04-08 DIAGNOSIS — F3342 Major depressive disorder, recurrent, in full remission: Secondary | ICD-10-CM | POA: Diagnosis not present

## 2021-04-08 DIAGNOSIS — K219 Gastro-esophageal reflux disease without esophagitis: Secondary | ICD-10-CM | POA: Diagnosis not present

## 2021-04-08 DIAGNOSIS — M810 Age-related osteoporosis without current pathological fracture: Secondary | ICD-10-CM | POA: Diagnosis not present

## 2021-04-08 DIAGNOSIS — E7800 Pure hypercholesterolemia, unspecified: Secondary | ICD-10-CM | POA: Diagnosis not present

## 2021-04-18 ENCOUNTER — Ambulatory Visit
Admission: RE | Admit: 2021-04-18 | Discharge: 2021-04-18 | Disposition: A | Discharge status: Home / Self Care | Payer: Medicare Other | Source: Ambulatory Visit | Attending: Geriatric Medicine | Admitting: Geriatric Medicine

## 2021-04-18 DIAGNOSIS — M810 Age-related osteoporosis without current pathological fracture: Secondary | ICD-10-CM | POA: Diagnosis not present

## 2021-04-18 DIAGNOSIS — Z780 Asymptomatic menopausal state: Secondary | ICD-10-CM | POA: Diagnosis not present

## 2021-04-18 DIAGNOSIS — M85852 Other specified disorders of bone density and structure, left thigh: Secondary | ICD-10-CM | POA: Diagnosis not present

## 2021-05-09 ENCOUNTER — Other Ambulatory Visit: Payer: Self-pay | Admitting: Geriatric Medicine

## 2021-05-09 DIAGNOSIS — Z1231 Encounter for screening mammogram for malignant neoplasm of breast: Secondary | ICD-10-CM

## 2021-05-12 ENCOUNTER — Ambulatory Visit: Payer: Medicare Other

## 2021-05-24 ENCOUNTER — Ambulatory Visit
Admission: RE | Admit: 2021-05-24 | Discharge: 2021-05-24 | Disposition: A | Discharge status: Home / Self Care | Payer: Medicare Other | Source: Ambulatory Visit | Attending: Geriatric Medicine | Admitting: Geriatric Medicine

## 2021-05-24 DIAGNOSIS — Z1231 Encounter for screening mammogram for malignant neoplasm of breast: Secondary | ICD-10-CM

## 2021-06-21 DIAGNOSIS — M810 Age-related osteoporosis without current pathological fracture: Secondary | ICD-10-CM | POA: Diagnosis not present

## 2021-06-29 DIAGNOSIS — Z85828 Personal history of other malignant neoplasm of skin: Secondary | ICD-10-CM | POA: Diagnosis not present

## 2021-06-29 DIAGNOSIS — L570 Actinic keratosis: Secondary | ICD-10-CM | POA: Diagnosis not present

## 2021-06-29 DIAGNOSIS — L820 Inflamed seborrheic keratosis: Secondary | ICD-10-CM | POA: Diagnosis not present

## 2021-06-29 DIAGNOSIS — L821 Other seborrheic keratosis: Secondary | ICD-10-CM | POA: Diagnosis not present

## 2021-08-04 DIAGNOSIS — H5201 Hypermetropia, right eye: Secondary | ICD-10-CM | POA: Diagnosis not present

## 2021-08-04 DIAGNOSIS — Z961 Presence of intraocular lens: Secondary | ICD-10-CM | POA: Diagnosis not present

## 2021-08-04 DIAGNOSIS — H18593 Other hereditary corneal dystrophies, bilateral: Secondary | ICD-10-CM | POA: Diagnosis not present

## 2021-09-06 DIAGNOSIS — Z1331 Encounter for screening for depression: Secondary | ICD-10-CM | POA: Diagnosis not present

## 2021-09-06 DIAGNOSIS — I10 Essential (primary) hypertension: Secondary | ICD-10-CM | POA: Diagnosis not present

## 2021-09-06 DIAGNOSIS — Z0000 Encounter for general adult medical examination without abnormal findings: Secondary | ICD-10-CM | POA: Diagnosis not present

## 2021-09-06 DIAGNOSIS — Z79899 Other long term (current) drug therapy: Secondary | ICD-10-CM | POA: Diagnosis not present

## 2021-09-06 DIAGNOSIS — E7800 Pure hypercholesterolemia, unspecified: Secondary | ICD-10-CM | POA: Diagnosis not present

## 2021-10-17 DIAGNOSIS — S0990XA Unspecified injury of head, initial encounter: Secondary | ICD-10-CM | POA: Diagnosis not present

## 2021-10-17 DIAGNOSIS — Z7982 Long term (current) use of aspirin: Secondary | ICD-10-CM | POA: Diagnosis not present

## 2021-10-17 DIAGNOSIS — E7800 Pure hypercholesterolemia, unspecified: Secondary | ICD-10-CM | POA: Diagnosis not present

## 2021-10-17 DIAGNOSIS — Z8673 Personal history of transient ischemic attack (TIA), and cerebral infarction without residual deficits: Secondary | ICD-10-CM | POA: Diagnosis not present

## 2021-10-17 DIAGNOSIS — I10 Essential (primary) hypertension: Secondary | ICD-10-CM | POA: Diagnosis not present

## 2021-10-17 DIAGNOSIS — Z0189 Encounter for other specified special examinations: Secondary | ICD-10-CM | POA: Diagnosis not present

## 2021-10-17 DIAGNOSIS — Z79899 Other long term (current) drug therapy: Secondary | ICD-10-CM | POA: Diagnosis not present

## 2022-01-07 DIAGNOSIS — Z23 Encounter for immunization: Secondary | ICD-10-CM | POA: Diagnosis not present

## 2022-01-12 DIAGNOSIS — M810 Age-related osteoporosis without current pathological fracture: Secondary | ICD-10-CM | POA: Diagnosis not present

## 2022-04-20 ENCOUNTER — Other Ambulatory Visit: Payer: Self-pay | Admitting: Internal Medicine

## 2022-04-20 DIAGNOSIS — Z1231 Encounter for screening mammogram for malignant neoplasm of breast: Secondary | ICD-10-CM

## 2022-05-17 DIAGNOSIS — E7800 Pure hypercholesterolemia, unspecified: Secondary | ICD-10-CM | POA: Diagnosis not present

## 2022-05-17 DIAGNOSIS — Z8673 Personal history of transient ischemic attack (TIA), and cerebral infarction without residual deficits: Secondary | ICD-10-CM | POA: Diagnosis not present

## 2022-05-17 DIAGNOSIS — G8929 Other chronic pain: Secondary | ICD-10-CM | POA: Diagnosis not present

## 2022-05-17 DIAGNOSIS — I10 Essential (primary) hypertension: Secondary | ICD-10-CM | POA: Diagnosis not present

## 2022-06-01 ENCOUNTER — Ambulatory Visit
Admission: RE | Admit: 2022-06-01 | Discharge: 2022-06-01 | Disposition: A | Discharge status: Home / Self Care | Payer: Medicare Other | Source: Ambulatory Visit | Attending: Internal Medicine | Admitting: Internal Medicine

## 2022-06-01 DIAGNOSIS — Z1231 Encounter for screening mammogram for malignant neoplasm of breast: Secondary | ICD-10-CM | POA: Diagnosis not present

## 2022-07-03 DIAGNOSIS — L821 Other seborrheic keratosis: Secondary | ICD-10-CM | POA: Diagnosis not present

## 2022-07-03 DIAGNOSIS — Z85828 Personal history of other malignant neoplasm of skin: Secondary | ICD-10-CM | POA: Diagnosis not present

## 2022-07-03 DIAGNOSIS — D692 Other nonthrombocytopenic purpura: Secondary | ICD-10-CM | POA: Diagnosis not present

## 2022-07-03 DIAGNOSIS — D0462 Carcinoma in situ of skin of left upper limb, including shoulder: Secondary | ICD-10-CM | POA: Diagnosis not present

## 2022-07-03 DIAGNOSIS — L570 Actinic keratosis: Secondary | ICD-10-CM | POA: Diagnosis not present

## 2022-07-17 DIAGNOSIS — M810 Age-related osteoporosis without current pathological fracture: Secondary | ICD-10-CM | POA: Diagnosis not present

## 2022-08-15 IMAGING — CT BRAIN WO(Adult)
3 of 4 series · 14 of 47 positions shown, 16 images · non-contrast
Comparison: none

[Series 4: brain cor 5.00 hr40 s3 · coronal · 0.33mm/px · 3 of 41 slices shown]
[im 14/41  brain]
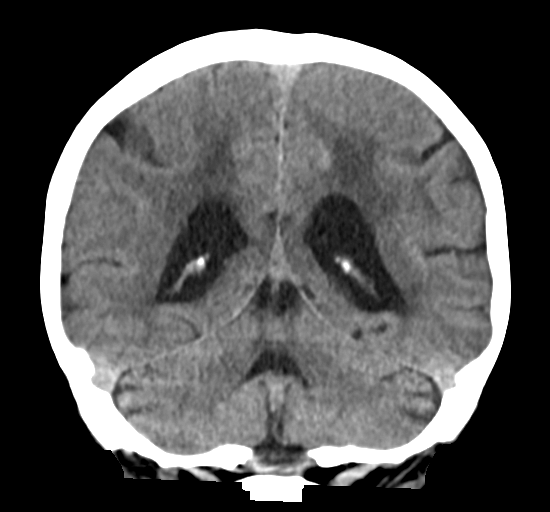
[im 18/41  brain]
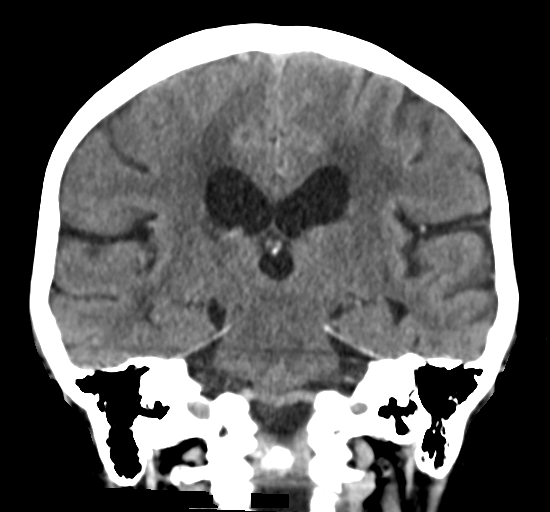
[im 23/41  brain]
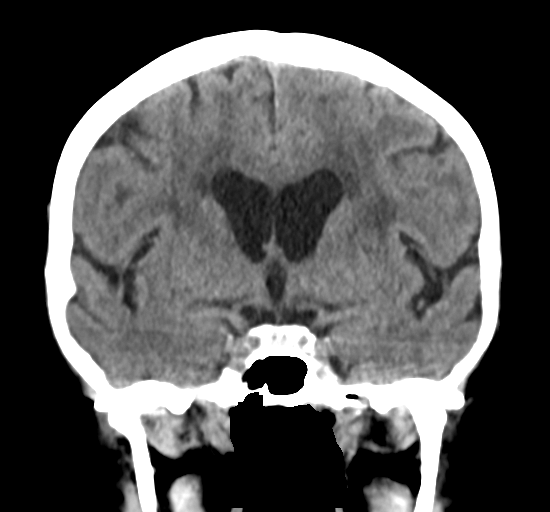

[Series 6: brain sag 5.00 hr40 s3 · sagittal · 0.33mm/px · 3 of 36 slices shown]
[im 12/36  brain]
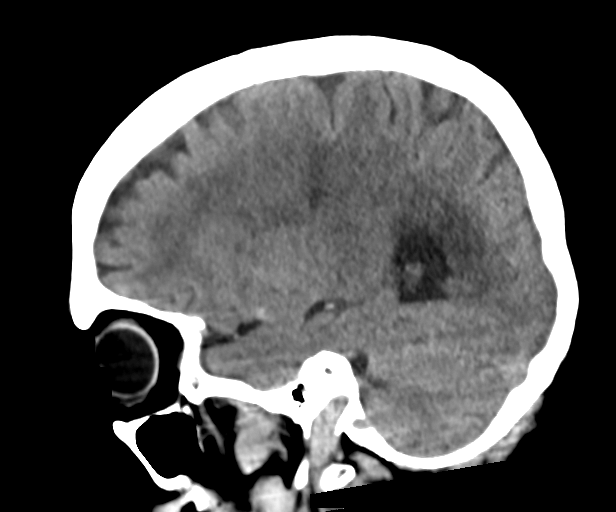
[im 18/36  brain]
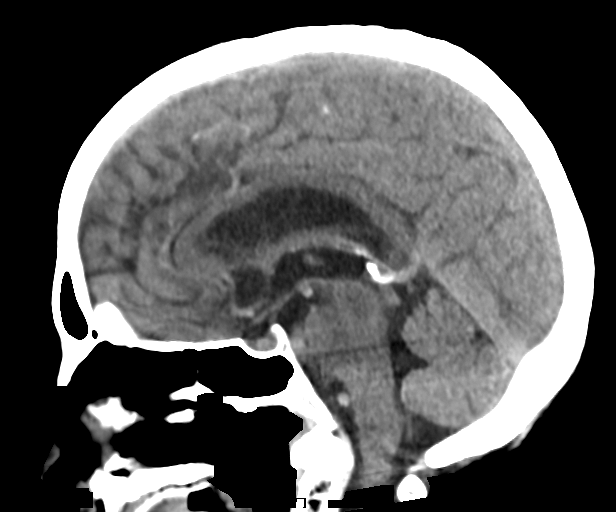
[im 24/36  brain]
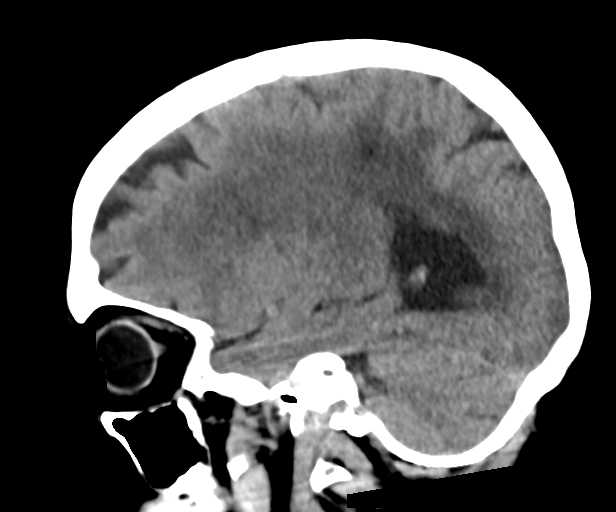

[Series 8: brain ax 2.00 hr60 s3 · axial · 0.36mm/px · z∈[-525,-388]mm · 8 of 88 slices shown, 10 images]
[im 9/88  brain]
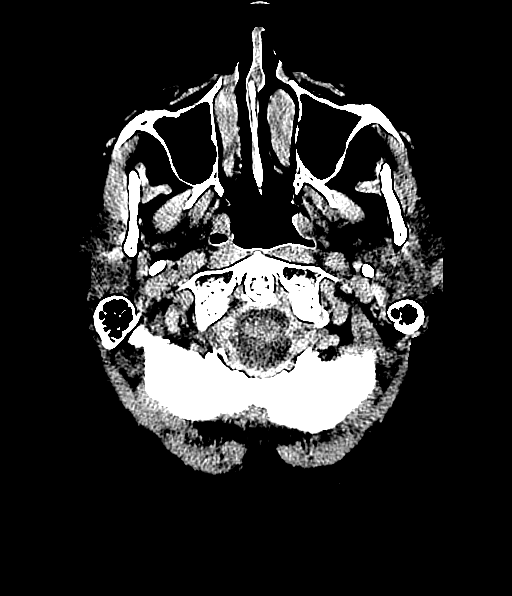
[im 9/88  bone]
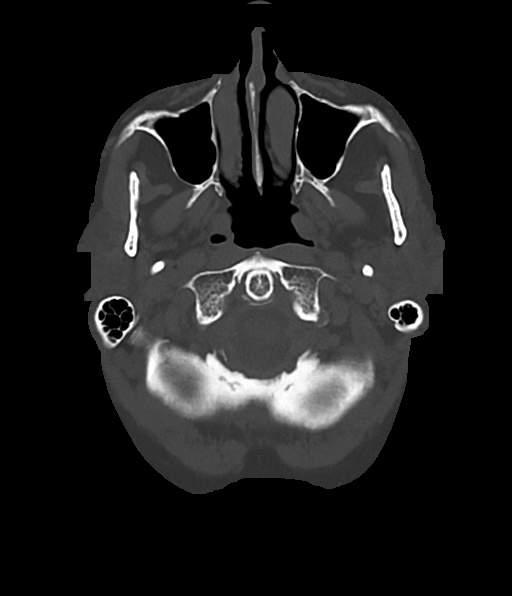
[im 17/88  brain]
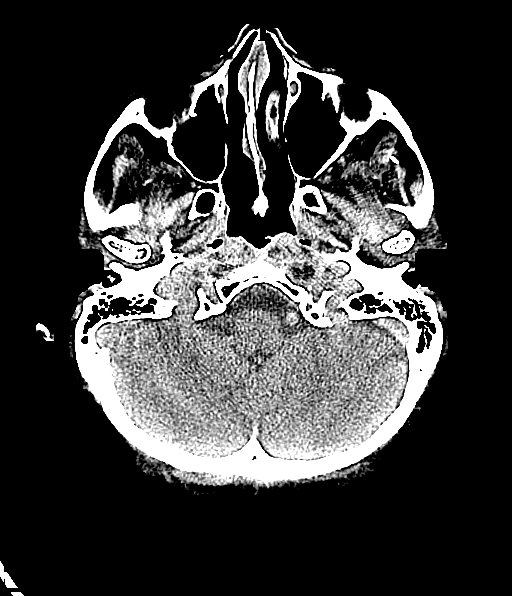
[im 30/88  brain]
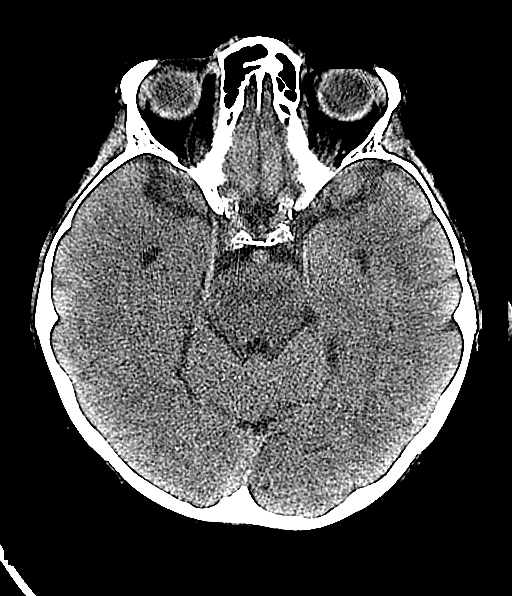
[im 38/88  brain]
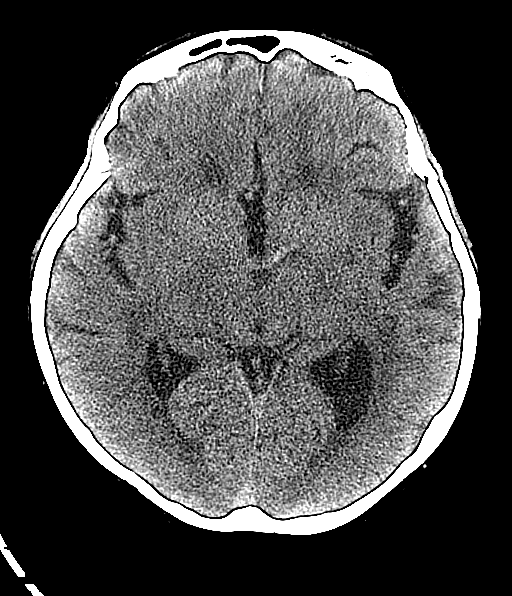
[im 50/88  brain]
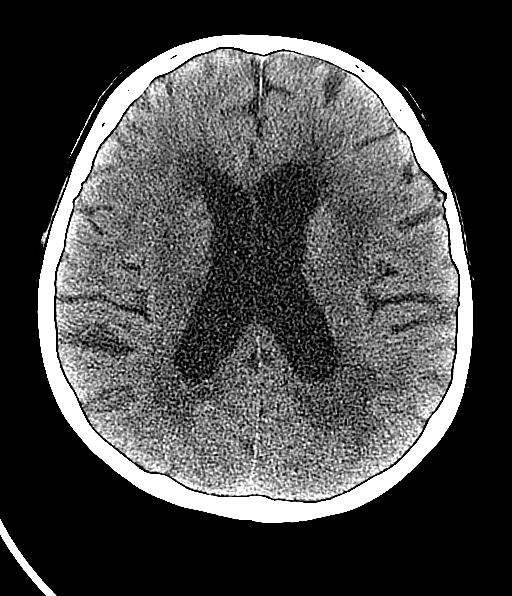
[im 50/88  bone]
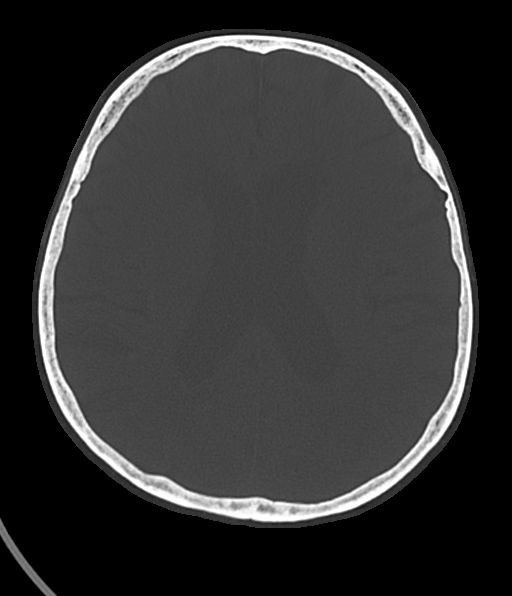
[im 59/88  brain]
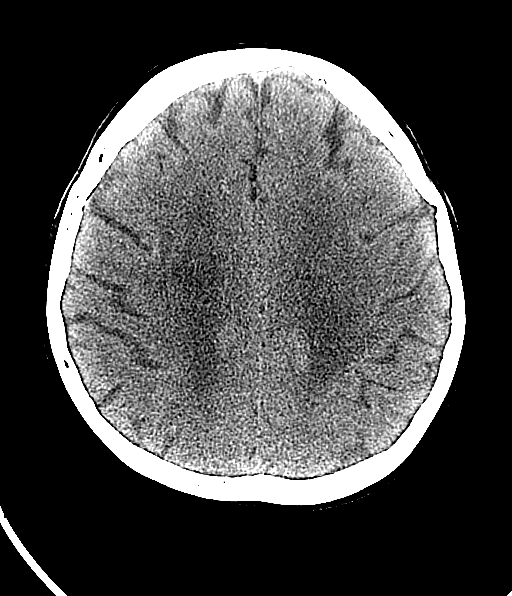
[im 71/88  brain]
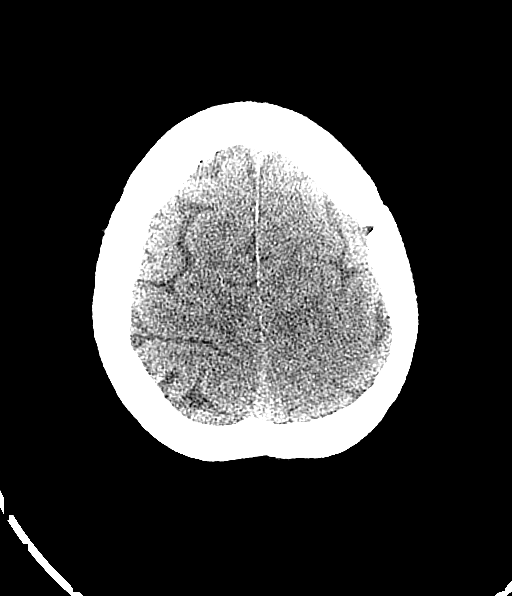
[im 79/88  brain]
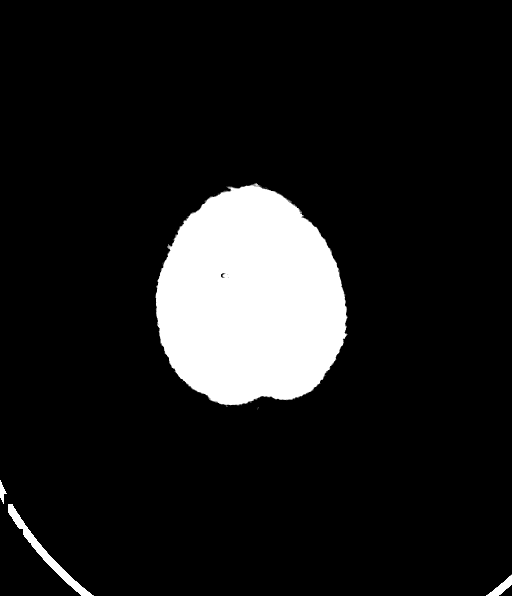

[14 of 47 positions shown; findings below may reference images not displayed]

10/17/21

DIAGNOSTIC STUDIES

EXAM

CT of the head without IV contrast

INDICATION

fall. head trauma.
PT STATES HAD A FALL THIS MORNING. HIT RIGHT BACK SIDE OF HEAD. SLIGHT HEADACHE. CT/NM 0/0. TJ

TECHNIQUE

All CT scans at this facility use dose modulation, iterative reconstruction, and/or weight based
dosing when appropriate to reduce radiation dose to as low as reasonably achievable.

Number of previous computed tomography exams in the last 12 months is 0  .

Number of previous nuclear medicine myocardial perfusion studies in the last 12 months is 0  .

COMPARISONS

February 27, 2020

FINDINGS

No intracranial hemorrhage. There is similar chronic microvascular disease and parenchymal volume
loss. No mass effect or cerebral herniation. Calvarium is intact. Mastoid air cells and paranasal
sinuses are clear.

IMPRESSION

No acute intracranial findings. Similar chronic microvascular disease and parenchymal volume loss.

Tech Notes:

PT STATES HAD A FALL THIS MORNING. HIT RIGHT BACK SIDE OF HEAD. SLIGHT HEADACHE. CT/NM 0/0. TJ

## 2022-08-18 DIAGNOSIS — R059 Cough, unspecified: Secondary | ICD-10-CM | POA: Diagnosis not present

## 2022-08-18 DIAGNOSIS — R509 Fever, unspecified: Secondary | ICD-10-CM | POA: Diagnosis not present

## 2022-08-18 DIAGNOSIS — J029 Acute pharyngitis, unspecified: Secondary | ICD-10-CM | POA: Diagnosis not present

## 2022-08-18 DIAGNOSIS — Z03818 Encounter for observation for suspected exposure to other biological agents ruled out: Secondary | ICD-10-CM | POA: Diagnosis not present

## 2022-08-18 DIAGNOSIS — R5383 Other fatigue: Secondary | ICD-10-CM | POA: Diagnosis not present

## 2022-08-18 DIAGNOSIS — R0981 Nasal congestion: Secondary | ICD-10-CM | POA: Diagnosis not present

## 2022-09-20 DIAGNOSIS — H04123 Dry eye syndrome of bilateral lacrimal glands: Secondary | ICD-10-CM | POA: Diagnosis not present

## 2022-09-20 DIAGNOSIS — Z961 Presence of intraocular lens: Secondary | ICD-10-CM | POA: Diagnosis not present

## 2022-09-20 DIAGNOSIS — H18593 Other hereditary corneal dystrophies, bilateral: Secondary | ICD-10-CM | POA: Diagnosis not present

## 2022-12-01 DIAGNOSIS — Z23 Encounter for immunization: Secondary | ICD-10-CM | POA: Diagnosis not present

## 2022-12-01 DIAGNOSIS — Z79899 Other long term (current) drug therapy: Secondary | ICD-10-CM | POA: Diagnosis not present

## 2022-12-01 DIAGNOSIS — Z1331 Encounter for screening for depression: Secondary | ICD-10-CM | POA: Diagnosis not present

## 2022-12-01 DIAGNOSIS — Z8673 Personal history of transient ischemic attack (TIA), and cerebral infarction without residual deficits: Secondary | ICD-10-CM | POA: Diagnosis not present

## 2022-12-01 DIAGNOSIS — Z0000 Encounter for general adult medical examination without abnormal findings: Secondary | ICD-10-CM | POA: Diagnosis not present

## 2022-12-01 DIAGNOSIS — G8929 Other chronic pain: Secondary | ICD-10-CM | POA: Diagnosis not present

## 2022-12-01 DIAGNOSIS — I10 Essential (primary) hypertension: Secondary | ICD-10-CM | POA: Diagnosis not present

## 2022-12-01 DIAGNOSIS — E7800 Pure hypercholesterolemia, unspecified: Secondary | ICD-10-CM | POA: Diagnosis not present

## 2022-12-01 DIAGNOSIS — M810 Age-related osteoporosis without current pathological fracture: Secondary | ICD-10-CM | POA: Diagnosis not present

## 2022-12-20 DIAGNOSIS — Z23 Encounter for immunization: Secondary | ICD-10-CM | POA: Diagnosis not present

## 2023-01-13 DIAGNOSIS — Z23 Encounter for immunization: Secondary | ICD-10-CM | POA: Diagnosis not present

## 2023-01-18 DIAGNOSIS — M810 Age-related osteoporosis without current pathological fracture: Secondary | ICD-10-CM | POA: Diagnosis not present

## 2023-06-01 ENCOUNTER — Other Ambulatory Visit: Payer: Self-pay | Admitting: Internal Medicine

## 2023-06-01 DIAGNOSIS — Z1231 Encounter for screening mammogram for malignant neoplasm of breast: Secondary | ICD-10-CM

## 2023-06-04 DIAGNOSIS — I10 Essential (primary) hypertension: Secondary | ICD-10-CM | POA: Diagnosis not present

## 2023-06-04 DIAGNOSIS — K224 Dyskinesia of esophagus: Secondary | ICD-10-CM | POA: Diagnosis not present

## 2023-06-04 DIAGNOSIS — E7800 Pure hypercholesterolemia, unspecified: Secondary | ICD-10-CM | POA: Diagnosis not present

## 2023-06-04 DIAGNOSIS — Z8673 Personal history of transient ischemic attack (TIA), and cerebral infarction without residual deficits: Secondary | ICD-10-CM | POA: Diagnosis not present

## 2023-06-07 DIAGNOSIS — E7800 Pure hypercholesterolemia, unspecified: Secondary | ICD-10-CM | POA: Diagnosis not present

## 2023-06-08 ENCOUNTER — Ambulatory Visit
Admission: RE | Admit: 2023-06-08 | Discharge: 2023-06-08 | Disposition: A | Discharge status: Home / Self Care | Source: Ambulatory Visit | Attending: Internal Medicine | Admitting: Internal Medicine

## 2023-06-08 DIAGNOSIS — Z1231 Encounter for screening mammogram for malignant neoplasm of breast: Secondary | ICD-10-CM

## 2023-06-11 DIAGNOSIS — I10 Essential (primary) hypertension: Secondary | ICD-10-CM | POA: Diagnosis not present

## 2023-06-11 DIAGNOSIS — M810 Age-related osteoporosis without current pathological fracture: Secondary | ICD-10-CM | POA: Diagnosis not present

## 2023-06-11 DIAGNOSIS — E7800 Pure hypercholesterolemia, unspecified: Secondary | ICD-10-CM | POA: Diagnosis not present

## 2023-07-05 DIAGNOSIS — L570 Actinic keratosis: Secondary | ICD-10-CM | POA: Diagnosis not present

## 2023-07-05 DIAGNOSIS — D692 Other nonthrombocytopenic purpura: Secondary | ICD-10-CM | POA: Diagnosis not present

## 2023-07-05 DIAGNOSIS — Z85828 Personal history of other malignant neoplasm of skin: Secondary | ICD-10-CM | POA: Diagnosis not present

## 2023-07-05 DIAGNOSIS — D485 Neoplasm of uncertain behavior of skin: Secondary | ICD-10-CM | POA: Diagnosis not present

## 2023-07-05 DIAGNOSIS — L821 Other seborrheic keratosis: Secondary | ICD-10-CM | POA: Diagnosis not present

## 2023-07-11 DIAGNOSIS — I10 Essential (primary) hypertension: Secondary | ICD-10-CM | POA: Diagnosis not present

## 2023-07-11 DIAGNOSIS — E7800 Pure hypercholesterolemia, unspecified: Secondary | ICD-10-CM | POA: Diagnosis not present

## 2023-07-11 DIAGNOSIS — M810 Age-related osteoporosis without current pathological fracture: Secondary | ICD-10-CM | POA: Diagnosis not present

## 2023-08-11 DIAGNOSIS — E7800 Pure hypercholesterolemia, unspecified: Secondary | ICD-10-CM | POA: Diagnosis not present

## 2023-08-11 DIAGNOSIS — M810 Age-related osteoporosis without current pathological fracture: Secondary | ICD-10-CM | POA: Diagnosis not present

## 2023-08-11 DIAGNOSIS — I10 Essential (primary) hypertension: Secondary | ICD-10-CM | POA: Diagnosis not present

## 2023-08-20 DIAGNOSIS — M810 Age-related osteoporosis without current pathological fracture: Secondary | ICD-10-CM | POA: Diagnosis not present

## 2023-08-27 DIAGNOSIS — R262 Difficulty in walking, not elsewhere classified: Secondary | ICD-10-CM | POA: Diagnosis not present

## 2023-08-27 DIAGNOSIS — Z23 Encounter for immunization: Secondary | ICD-10-CM | POA: Diagnosis not present

## 2023-08-27 DIAGNOSIS — R269 Unspecified abnormalities of gait and mobility: Secondary | ICD-10-CM | POA: Diagnosis not present

## 2023-08-29 DIAGNOSIS — M810 Age-related osteoporosis without current pathological fracture: Secondary | ICD-10-CM | POA: Diagnosis not present

## 2023-08-29 DIAGNOSIS — M1990 Unspecified osteoarthritis, unspecified site: Secondary | ICD-10-CM | POA: Diagnosis not present

## 2023-08-29 DIAGNOSIS — R1310 Dysphagia, unspecified: Secondary | ICD-10-CM | POA: Diagnosis not present

## 2023-08-29 DIAGNOSIS — Z86711 Personal history of pulmonary embolism: Secondary | ICD-10-CM | POA: Diagnosis not present

## 2023-08-29 DIAGNOSIS — E7800 Pure hypercholesterolemia, unspecified: Secondary | ICD-10-CM | POA: Diagnosis not present

## 2023-08-29 DIAGNOSIS — Z8673 Personal history of transient ischemic attack (TIA), and cerebral infarction without residual deficits: Secondary | ICD-10-CM | POA: Diagnosis not present

## 2023-08-29 DIAGNOSIS — Z8616 Personal history of COVID-19: Secondary | ICD-10-CM | POA: Diagnosis not present

## 2023-08-29 DIAGNOSIS — I119 Hypertensive heart disease without heart failure: Secondary | ICD-10-CM | POA: Diagnosis not present

## 2023-08-29 DIAGNOSIS — Z9181 History of falling: Secondary | ICD-10-CM | POA: Diagnosis not present

## 2023-08-29 DIAGNOSIS — Z7982 Long term (current) use of aspirin: Secondary | ICD-10-CM | POA: Diagnosis not present

## 2023-08-29 DIAGNOSIS — M4807 Spinal stenosis, lumbosacral region: Secondary | ICD-10-CM | POA: Diagnosis not present

## 2023-08-29 DIAGNOSIS — K449 Diaphragmatic hernia without obstruction or gangrene: Secondary | ICD-10-CM | POA: Diagnosis not present

## 2023-08-29 DIAGNOSIS — K224 Dyskinesia of esophagus: Secondary | ICD-10-CM | POA: Diagnosis not present

## 2023-09-06 DIAGNOSIS — M810 Age-related osteoporosis without current pathological fracture: Secondary | ICD-10-CM | POA: Diagnosis not present

## 2023-09-06 DIAGNOSIS — R1310 Dysphagia, unspecified: Secondary | ICD-10-CM | POA: Diagnosis not present

## 2023-09-06 DIAGNOSIS — E7800 Pure hypercholesterolemia, unspecified: Secondary | ICD-10-CM | POA: Diagnosis not present

## 2023-09-06 DIAGNOSIS — M1990 Unspecified osteoarthritis, unspecified site: Secondary | ICD-10-CM | POA: Diagnosis not present

## 2023-09-06 DIAGNOSIS — I119 Hypertensive heart disease without heart failure: Secondary | ICD-10-CM | POA: Diagnosis not present

## 2023-09-06 DIAGNOSIS — K224 Dyskinesia of esophagus: Secondary | ICD-10-CM | POA: Diagnosis not present

## 2023-09-10 DIAGNOSIS — E7800 Pure hypercholesterolemia, unspecified: Secondary | ICD-10-CM | POA: Diagnosis not present

## 2023-09-10 DIAGNOSIS — M810 Age-related osteoporosis without current pathological fracture: Secondary | ICD-10-CM | POA: Diagnosis not present

## 2023-09-10 DIAGNOSIS — I10 Essential (primary) hypertension: Secondary | ICD-10-CM | POA: Diagnosis not present

## 2023-09-11 DIAGNOSIS — M1990 Unspecified osteoarthritis, unspecified site: Secondary | ICD-10-CM | POA: Diagnosis not present

## 2023-09-11 DIAGNOSIS — E7800 Pure hypercholesterolemia, unspecified: Secondary | ICD-10-CM | POA: Diagnosis not present

## 2023-09-11 DIAGNOSIS — R1310 Dysphagia, unspecified: Secondary | ICD-10-CM | POA: Diagnosis not present

## 2023-09-11 DIAGNOSIS — M810 Age-related osteoporosis without current pathological fracture: Secondary | ICD-10-CM | POA: Diagnosis not present

## 2023-09-11 DIAGNOSIS — K224 Dyskinesia of esophagus: Secondary | ICD-10-CM | POA: Diagnosis not present

## 2023-09-11 DIAGNOSIS — I119 Hypertensive heart disease without heart failure: Secondary | ICD-10-CM | POA: Diagnosis not present

## 2023-09-19 DIAGNOSIS — E7800 Pure hypercholesterolemia, unspecified: Secondary | ICD-10-CM | POA: Diagnosis not present

## 2023-09-19 DIAGNOSIS — M810 Age-related osteoporosis without current pathological fracture: Secondary | ICD-10-CM | POA: Diagnosis not present

## 2023-09-19 DIAGNOSIS — R1310 Dysphagia, unspecified: Secondary | ICD-10-CM | POA: Diagnosis not present

## 2023-09-19 DIAGNOSIS — K224 Dyskinesia of esophagus: Secondary | ICD-10-CM | POA: Diagnosis not present

## 2023-09-19 DIAGNOSIS — I119 Hypertensive heart disease without heart failure: Secondary | ICD-10-CM | POA: Diagnosis not present

## 2023-09-19 DIAGNOSIS — M1990 Unspecified osteoarthritis, unspecified site: Secondary | ICD-10-CM | POA: Diagnosis not present

## 2023-09-25 DIAGNOSIS — E7800 Pure hypercholesterolemia, unspecified: Secondary | ICD-10-CM | POA: Diagnosis not present

## 2023-09-25 DIAGNOSIS — M1990 Unspecified osteoarthritis, unspecified site: Secondary | ICD-10-CM | POA: Diagnosis not present

## 2023-09-25 DIAGNOSIS — R1310 Dysphagia, unspecified: Secondary | ICD-10-CM | POA: Diagnosis not present

## 2023-09-25 DIAGNOSIS — M810 Age-related osteoporosis without current pathological fracture: Secondary | ICD-10-CM | POA: Diagnosis not present

## 2023-09-25 DIAGNOSIS — K224 Dyskinesia of esophagus: Secondary | ICD-10-CM | POA: Diagnosis not present

## 2023-09-25 DIAGNOSIS — I119 Hypertensive heart disease without heart failure: Secondary | ICD-10-CM | POA: Diagnosis not present

## 2023-09-28 DIAGNOSIS — Z8616 Personal history of COVID-19: Secondary | ICD-10-CM | POA: Diagnosis not present

## 2023-09-28 DIAGNOSIS — K449 Diaphragmatic hernia without obstruction or gangrene: Secondary | ICD-10-CM | POA: Diagnosis not present

## 2023-09-28 DIAGNOSIS — Z9181 History of falling: Secondary | ICD-10-CM | POA: Diagnosis not present

## 2023-09-28 DIAGNOSIS — M4807 Spinal stenosis, lumbosacral region: Secondary | ICD-10-CM | POA: Diagnosis not present

## 2023-09-28 DIAGNOSIS — Z8673 Personal history of transient ischemic attack (TIA), and cerebral infarction without residual deficits: Secondary | ICD-10-CM | POA: Diagnosis not present

## 2023-09-28 DIAGNOSIS — Z7982 Long term (current) use of aspirin: Secondary | ICD-10-CM | POA: Diagnosis not present

## 2023-09-28 DIAGNOSIS — R1310 Dysphagia, unspecified: Secondary | ICD-10-CM | POA: Diagnosis not present

## 2023-09-28 DIAGNOSIS — I119 Hypertensive heart disease without heart failure: Secondary | ICD-10-CM | POA: Diagnosis not present

## 2023-09-28 DIAGNOSIS — M1990 Unspecified osteoarthritis, unspecified site: Secondary | ICD-10-CM | POA: Diagnosis not present

## 2023-09-28 DIAGNOSIS — Z86711 Personal history of pulmonary embolism: Secondary | ICD-10-CM | POA: Diagnosis not present

## 2023-09-28 DIAGNOSIS — M810 Age-related osteoporosis without current pathological fracture: Secondary | ICD-10-CM | POA: Diagnosis not present

## 2023-09-28 DIAGNOSIS — E7800 Pure hypercholesterolemia, unspecified: Secondary | ICD-10-CM | POA: Diagnosis not present

## 2023-09-28 DIAGNOSIS — K224 Dyskinesia of esophagus: Secondary | ICD-10-CM | POA: Diagnosis not present

## 2023-10-01 DIAGNOSIS — H18593 Other hereditary corneal dystrophies, bilateral: Secondary | ICD-10-CM | POA: Diagnosis not present

## 2023-10-01 DIAGNOSIS — H04123 Dry eye syndrome of bilateral lacrimal glands: Secondary | ICD-10-CM | POA: Diagnosis not present

## 2023-10-03 DIAGNOSIS — R1310 Dysphagia, unspecified: Secondary | ICD-10-CM | POA: Diagnosis not present

## 2023-10-03 DIAGNOSIS — K224 Dyskinesia of esophagus: Secondary | ICD-10-CM | POA: Diagnosis not present

## 2023-10-03 DIAGNOSIS — I119 Hypertensive heart disease without heart failure: Secondary | ICD-10-CM | POA: Diagnosis not present

## 2023-10-03 DIAGNOSIS — M1990 Unspecified osteoarthritis, unspecified site: Secondary | ICD-10-CM | POA: Diagnosis not present

## 2023-10-03 DIAGNOSIS — M810 Age-related osteoporosis without current pathological fracture: Secondary | ICD-10-CM | POA: Diagnosis not present

## 2023-10-03 DIAGNOSIS — E7800 Pure hypercholesterolemia, unspecified: Secondary | ICD-10-CM | POA: Diagnosis not present

## 2023-10-08 DIAGNOSIS — M1990 Unspecified osteoarthritis, unspecified site: Secondary | ICD-10-CM | POA: Diagnosis not present

## 2023-10-08 DIAGNOSIS — R1310 Dysphagia, unspecified: Secondary | ICD-10-CM | POA: Diagnosis not present

## 2023-10-08 DIAGNOSIS — K224 Dyskinesia of esophagus: Secondary | ICD-10-CM | POA: Diagnosis not present

## 2023-10-08 DIAGNOSIS — M810 Age-related osteoporosis without current pathological fracture: Secondary | ICD-10-CM | POA: Diagnosis not present

## 2023-10-08 DIAGNOSIS — I119 Hypertensive heart disease without heart failure: Secondary | ICD-10-CM | POA: Diagnosis not present

## 2023-10-08 DIAGNOSIS — E7800 Pure hypercholesterolemia, unspecified: Secondary | ICD-10-CM | POA: Diagnosis not present

## 2023-10-11 DIAGNOSIS — M810 Age-related osteoporosis without current pathological fracture: Secondary | ICD-10-CM | POA: Diagnosis not present

## 2023-10-11 DIAGNOSIS — I10 Essential (primary) hypertension: Secondary | ICD-10-CM | POA: Diagnosis not present

## 2023-10-11 DIAGNOSIS — E7800 Pure hypercholesterolemia, unspecified: Secondary | ICD-10-CM | POA: Diagnosis not present

## 2023-10-17 DIAGNOSIS — K224 Dyskinesia of esophagus: Secondary | ICD-10-CM | POA: Diagnosis not present

## 2023-10-17 DIAGNOSIS — E7800 Pure hypercholesterolemia, unspecified: Secondary | ICD-10-CM | POA: Diagnosis not present

## 2023-10-17 DIAGNOSIS — M810 Age-related osteoporosis without current pathological fracture: Secondary | ICD-10-CM | POA: Diagnosis not present

## 2023-10-17 DIAGNOSIS — I119 Hypertensive heart disease without heart failure: Secondary | ICD-10-CM | POA: Diagnosis not present

## 2023-10-17 DIAGNOSIS — R1310 Dysphagia, unspecified: Secondary | ICD-10-CM | POA: Diagnosis not present

## 2023-10-17 DIAGNOSIS — M1990 Unspecified osteoarthritis, unspecified site: Secondary | ICD-10-CM | POA: Diagnosis not present

## 2023-10-23 DIAGNOSIS — M810 Age-related osteoporosis without current pathological fracture: Secondary | ICD-10-CM | POA: Diagnosis not present

## 2023-10-23 DIAGNOSIS — M1990 Unspecified osteoarthritis, unspecified site: Secondary | ICD-10-CM | POA: Diagnosis not present

## 2023-10-23 DIAGNOSIS — R1310 Dysphagia, unspecified: Secondary | ICD-10-CM | POA: Diagnosis not present

## 2023-10-23 DIAGNOSIS — I119 Hypertensive heart disease without heart failure: Secondary | ICD-10-CM | POA: Diagnosis not present

## 2023-10-23 DIAGNOSIS — E7800 Pure hypercholesterolemia, unspecified: Secondary | ICD-10-CM | POA: Diagnosis not present

## 2023-10-23 DIAGNOSIS — K224 Dyskinesia of esophagus: Secondary | ICD-10-CM | POA: Diagnosis not present

## 2023-10-27 ENCOUNTER — Emergency Department (HOSPITAL_BASED_OUTPATIENT_CLINIC_OR_DEPARTMENT_OTHER): Admission: EM | Admit: 2023-10-27 | Discharge: 2023-10-27 | Disposition: A | Discharge status: Home / Self Care

## 2023-10-27 ENCOUNTER — Other Ambulatory Visit: Payer: Self-pay

## 2023-10-27 ENCOUNTER — Emergency Department (HOSPITAL_BASED_OUTPATIENT_CLINIC_OR_DEPARTMENT_OTHER): Admitting: Radiology

## 2023-10-27 ENCOUNTER — Encounter (HOSPITAL_BASED_OUTPATIENT_CLINIC_OR_DEPARTMENT_OTHER): Payer: Self-pay | Admitting: Urology

## 2023-10-27 ENCOUNTER — Emergency Department (HOSPITAL_BASED_OUTPATIENT_CLINIC_OR_DEPARTMENT_OTHER)

## 2023-10-27 ENCOUNTER — Ambulatory Visit: Admission: EM | Admit: 2023-10-27 | Discharge: 2023-10-27 | Disposition: A | Discharge status: Home / Self Care

## 2023-10-27 DIAGNOSIS — S0003XA Contusion of scalp, initial encounter: Secondary | ICD-10-CM | POA: Insufficient documentation

## 2023-10-27 DIAGNOSIS — Z8673 Personal history of transient ischemic attack (TIA), and cerebral infarction without residual deficits: Secondary | ICD-10-CM | POA: Insufficient documentation

## 2023-10-27 DIAGNOSIS — W01198A Fall on same level from slipping, tripping and stumbling with subsequent striking against other object, initial encounter: Secondary | ICD-10-CM | POA: Diagnosis not present

## 2023-10-27 DIAGNOSIS — E7800 Pure hypercholesterolemia, unspecified: Secondary | ICD-10-CM | POA: Insufficient documentation

## 2023-10-27 DIAGNOSIS — S7002XA Contusion of left hip, initial encounter: Secondary | ICD-10-CM | POA: Diagnosis not present

## 2023-10-27 DIAGNOSIS — M25552 Pain in left hip: Secondary | ICD-10-CM | POA: Insufficient documentation

## 2023-10-27 DIAGNOSIS — S0990XA Unspecified injury of head, initial encounter: Secondary | ICD-10-CM | POA: Diagnosis not present

## 2023-10-27 DIAGNOSIS — W19XXXA Unspecified fall, initial encounter: Secondary | ICD-10-CM | POA: Diagnosis not present

## 2023-10-27 DIAGNOSIS — M4312 Spondylolisthesis, cervical region: Secondary | ICD-10-CM | POA: Diagnosis not present

## 2023-10-27 DIAGNOSIS — M48061 Spinal stenosis, lumbar region without neurogenic claudication: Secondary | ICD-10-CM | POA: Insufficient documentation

## 2023-10-27 DIAGNOSIS — G459 Transient cerebral ischemic attack, unspecified: Secondary | ICD-10-CM | POA: Insufficient documentation

## 2023-10-27 DIAGNOSIS — Z90710 Acquired absence of both cervix and uterus: Secondary | ICD-10-CM | POA: Diagnosis not present

## 2023-10-27 DIAGNOSIS — R1312 Dysphagia, oropharyngeal phase: Secondary | ICD-10-CM | POA: Insufficient documentation

## 2023-10-27 DIAGNOSIS — K219 Gastro-esophageal reflux disease without esophagitis: Secondary | ICD-10-CM | POA: Insufficient documentation

## 2023-10-27 DIAGNOSIS — S79912A Unspecified injury of left hip, initial encounter: Secondary | ICD-10-CM | POA: Diagnosis not present

## 2023-10-27 DIAGNOSIS — K224 Dyskinesia of esophagus: Secondary | ICD-10-CM | POA: Insufficient documentation

## 2023-10-27 DIAGNOSIS — M810 Age-related osteoporosis without current pathological fracture: Secondary | ICD-10-CM | POA: Insufficient documentation

## 2023-10-27 DIAGNOSIS — I6782 Cerebral ischemia: Secondary | ICD-10-CM | POA: Diagnosis not present

## 2023-10-27 DIAGNOSIS — M47812 Spondylosis without myelopathy or radiculopathy, cervical region: Secondary | ICD-10-CM | POA: Diagnosis not present

## 2023-10-27 DIAGNOSIS — M4802 Spinal stenosis, cervical region: Secondary | ICD-10-CM | POA: Diagnosis not present

## 2023-10-27 DIAGNOSIS — F3342 Major depressive disorder, recurrent, in full remission: Secondary | ICD-10-CM | POA: Insufficient documentation

## 2023-10-27 DIAGNOSIS — I672 Cerebral atherosclerosis: Secondary | ICD-10-CM | POA: Diagnosis not present

## 2023-10-27 DIAGNOSIS — Z7982 Long term (current) use of aspirin: Secondary | ICD-10-CM | POA: Insufficient documentation

## 2023-10-27 DIAGNOSIS — R9389 Abnormal findings on diagnostic imaging of other specified body structures: Secondary | ICD-10-CM | POA: Insufficient documentation

## 2023-10-27 DIAGNOSIS — I10 Essential (primary) hypertension: Secondary | ICD-10-CM | POA: Insufficient documentation

## 2023-10-27 DIAGNOSIS — S199XXA Unspecified injury of neck, initial encounter: Secondary | ICD-10-CM | POA: Diagnosis not present

## 2023-10-27 NOTE — ED Triage Notes (Signed)
 I don't remember a lot, I was at the closet and putting stuff straight and that is the last thing I remember, I then woke up a distance away where I had fallen and hit the left side of my head on the wall and now having left hip pain too. Confirmed LOC. My head feel's not too bad but no nausea or vomiting right now.

## 2023-10-27 NOTE — ED Triage Notes (Signed)
 Sent from UC  Head pain and left hip pain after fall today, pain with weight bearing and bump noted to head No LOC prior to fall , states slight nausea   Not on blood thinners

## 2023-10-27 NOTE — Discharge Instructions (Addendum)
 You can take Tylenol  for the pain.  There is no obvious fracture seen on the CT scan.  No evidence of any kind of brain bleed.  Please continue to use a walker at home.

## 2023-10-27 NOTE — ED Notes (Signed)
 Patient is being discharged from the Urgent Care and sent to the Emergency Department via Private Vehicle (Family) . Per Provider, patient is in need of higher level of care due to Head injury (LOC). Patient is aware and verbalizes understanding of plan of care.  Vitals:   10/27/23 1512  BP: (!) 159/77  Pulse: 81  Resp: 18  Temp: 98.1 F (36.7 C)  SpO2: 95%

## 2023-10-27 NOTE — ED Provider Notes (Signed)
 [place] EMERGENCY DEPARTMENT AT [facility] Provider Note   CSN: [id number] Arrival date & time: 10/27/23  1604     Patient presents with: Kathy Calhoun is a 89 y.o. female.    Fall Pertinent negatives include no chest pain, no abdominal pain and no shortness of breath.       Patient presents because of blunt trauma.  According to patient, she lost her balance today and subsequently fell backwards.  Did hit her head.  Did not lose consciousness.  Takes aspirin  but no other anticoagulation.  Patient denies any new cervical thoracic or lumbar pain.  Denies any chest pain or shortness of breath before or after the event.  Endorsing some left hip pain.  Patient states that she is walks with a cane at baseline.  Has been able to ambulate since the fall but does have increasing pain in her left hip.  No numbness or tingling anywhere.  Otherwise, denies all complaints.  No chest pain shortness of breath, no nausea vomit diarrhea.  No dysuria.  No UTI symptoms.    Prior to Admission medications   Medication Sig Start Date End Date Taking? Authorizing Provider  acetaminophen  (TYLENOL ) 500 MG tablet Take 1,000 mg by mouth every 6 (six) hours as needed (pain).     [provider]  alendronate (FOSAMAX) 70 MG tablet Take 70 mg by mouth once a week. 09/29/16   [provider]  amLODipine  (NORVASC ) 5 MG tablet Take 5 mg by mouth daily. 11/30/15   [provider]  aspirin  EC 81 MG tablet Take 81 mg by mouth daily.    [provider]  atorvastatin  (LIPITOR) 40 MG tablet Take 40 mg by mouth daily.    [provider]  Calcium  Carb-Cholecalciferol  (CALCIUM  PLUS VITAMIN D ) 500-5 MG-MCG TABS 1 tablet with meals Orally Twice a day    [provider]  Calcium  Carb-Cholecalciferol  600-20 MG-MCG CHEW Chew by mouth. 11/22/15   [provider]  Calcium -Vitamin D  (CALTRATE 600 PLUS-VIT D PO) Take 1 tablet by mouth once or twice  daily    [provider]  diclofenac  sodium (VOLTAREN ) 1 % GEL Apply 2 g topically 4 (four) times daily as needed (pain).    [provider]  DULoxetine  (CYMBALTA ) 30 MG capsule Take 30 mg by mouth daily.    [provider]  famotidine (PEPCID) 40 MG tablet Take 40 mg by mouth daily.    [provider]  lisinopril  (PRINIVIL ,ZESTRIL ) 5 MG tablet Take 5 mg by mouth daily.  08/05/13   [provider]  Multiple Vitamin (MULTI VITAMIN) TABS 1 tablet Orally Once a day    [provider]  Multiple Vitamins-Minerals (MULTIVITAMIN WITH MINERALS) tablet Take 1 tablet by mouth daily.    [provider]    Allergies: Codeine and Sulfa antibiotics    Review of Systems  Constitutional:  Negative for chills and fever.  HENT:  Negative for ear pain and sore throat.   Eyes:  Negative for pain and visual disturbance.  Respiratory:  Negative for cough and shortness of breath.   Cardiovascular:  Negative for chest pain and palpitations.  Gastrointestinal:  Negative for abdominal pain and vomiting.  Genitourinary:  Negative for dysuria and hematuria.  Musculoskeletal:  Negative for arthralgias and back pain.  Skin:  Negative for color change and rash.  Neurological:  Negative for seizures and syncope.  All other systems reviewed and are negative.   Updated Vital  Signs BP 125/84   Pulse 79   Temp 97.9 F (36.6 C)   Resp 18   Ht 4' 11 (1.499 m)   Wt 59 kg   LMP  (LMP Unknown)   SpO2 98%   BMI 26.27 kg/m   Physical Exam Vitals and nursing note reviewed.  Constitutional:      General: She is not in acute distress.    Appearance: She is well-developed.  HENT:     Head: Normocephalic and atraumatic.   Eyes:     Conjunctiva/sclera: Conjunctivae normal.  Cardiovascular:     Rate and Rhythm: Normal rate and regular rhythm.     Heart sounds: No murmur heard. Pulmonary:     Effort: Pulmonary effort is normal. No respiratory distress.      Breath sounds: Normal breath sounds.  Abdominal:     Palpations: Abdomen is soft.     Tenderness: There is no abdominal tenderness.  Musculoskeletal:        General: No swelling.     Cervical back: Neck supple.       Legs:  Skin:    General: Skin is warm and dry.     Capillary Refill: Capillary refill takes less than 2 seconds.  Neurological:     Mental Status: She is alert.  Psychiatric:        Mood and Affect: Mood normal.     (all labs ordered are listed, but only abnormal results are displayed) Labs Reviewed - No data to display  EKG: None  Radiology: CT PELVIS WO CONTRAST Result Date: 10/27/2023 CLINICAL DATA:  Questionable pubic bone fracture on prior radiograph EXAM: CT PELVIS WITHOUT CONTRAST TECHNIQUE: Multidetector CT imaging of the pelvis was performed following the standard protocol without intravenous contrast. RADIATION DOSE REDUCTION: This exam was performed according to the departmental dose-optimization program which includes automated exposure control, adjustment of the mA and/or kV according to patient size and/or use of iterative reconstruction technique. COMPARISON:  Same day hip radiographs FINDINGS: Urinary Tract:  Unremarkable. Bowel:  No acute findings in the visualized bowel loops. Vascular/Lymphatic: No lymphadenopathy. Scattered atherosclerotic calcifications. Reproductive:  Uterus is surgically absent.  No adnexal masses. Other:  None. Musculoskeletal: Degenerative changes at pubic symphysis. No acute fractures are identified. Disc space narrowing at L5-S1. IMPRESSION: No acute osseous findings.  No fractures identified. Electronically Signed   By: Michaeline Blanch M.D.   On: 10/27/2023 17:48   DG Hip Unilat W or Wo Pelvis 2-3 Views Left Result Date: 10/27/2023 CLINICAL DATA:  Blunt trauma EXAM: DG HIP (WITH OR WITHOUT PELVIS) 2-3V LEFT COMPARISON:  None Available. FINDINGS: SI joints are patent. Pubic symphysis appears intact. No definitive fracture or  malalignment of the left hip. Possible left pubic rami fractures. IMPRESSION: Fall possible left pubic rami fractures. Suggest correlation with CT. Electronically Signed   By: Luke Bun M.D.   On: 10/27/2023 17:15   CT Cervical Spine Wo Contrast Result Date: 10/27/2023 CLINICAL DATA:  Blunt trauma EXAM: CT CERVICAL SPINE WITHOUT CONTRAST TECHNIQUE: Multidetector CT imaging of the cervical spine was performed without intravenous contrast. Multiplanar CT image reconstructions were also generated. RADIATION DOSE REDUCTION: This exam was performed according to the departmental dose-optimization program which includes automated exposure control, adjustment of the mA and/or kV according to patient size and/or use of iterative reconstruction technique. COMPARISON:  CT 12/15/2015 FINDINGS: Alignment: Stable trace anterolisthesis C5 on C6. Facet alignment is within normal limits. Skull base and vertebrae: No acute fracture. No primary bone  lesion or focal pathologic process. Partial ankylosis at C3-C4. Soft tissues and spinal canal: No prevertebral fluid or swelling. No visible canal hematoma. Disc levels: Multilevel degenerative changes. Advanced disc space narrowing C6-C7. Multilevel facet degenerative change with foraminal narrowing. No high-grade canal stenosis. Upper chest: Stable apical pleuroparenchymal scarring and calcified pleural plaques. Other: None IMPRESSION: Degenerative changes of the cervical spine. No acute osseous abnormality. Electronically Signed   By: Luke Bun M.D.   On: 10/27/2023 17:13   CT Head Wo Contrast Result Date: 10/27/2023 CLINICAL DATA:  Blunt trauma EXAM: CT HEAD WITHOUT CONTRAST TECHNIQUE: Contiguous axial images were obtained from the base of the skull through the vertex without intravenous contrast. RADIATION DOSE REDUCTION: This exam was performed according to the departmental dose-optimization program which includes automated exposure control, adjustment of the mA and/or  kV according to patient size and/or use of iterative reconstruction technique. COMPARISON:  CT brain 12/15/2015, MRI 07/12/2015 FINDINGS: Brain: No acute territorial infarction, hemorrhage, or intracranial mass. Atrophy and advanced chronic small vessel ischemic changes of the white matter. Small chronic infarct in the right white matter. Stable ventricular size. Vascular: No hyperdense vessels. Vertebral and carotid vascular calcification Skull: Normal. Negative for fracture or focal lesion. Sinuses/Orbits: No acute finding. Other: None IMPRESSION: 1. No CT evidence for acute intracranial abnormality. 2. Atrophy and advanced chronic small vessel ischemic changes of the white matter. Electronically Signed   By: Luke Bun M.D.   On: 10/27/2023 17:09     Procedures   Medications Ordered in the ED - No data to display                                  Medical Decision Making Amount and/or Complexity of Data Reviewed Radiology: ordered.    Patient presents because of blunt trauma.  According to patient, she lost her balance today and subsequently fell backwards.  Did hit her head.  Did not lose consciousness.  Takes aspirin  but no other anticoagulation.  Patient denies any new cervical thoracic or lumbar pain.  Denies any chest pain or shortness of breath before or after the event.  Endorsing some left hip pain.  Patient states that she is walks with a cane at baseline.  Has been able to ambulate since the fall but does have increasing pain in her left hip.  No numbness or tingling anywhere.  Otherwise, denies all complaints.  No chest pain shortness of breath, no nausea vomit diarrhea.  No dysuria.  No UTI symptoms.   Upon exam, patient ANO x 3 GCS 15.  At neurobaseline per daughter at bedside.  No significant cervical thoracic or lumbar pain to palpation.  No numbness or tingling anywhere.   Patient does have a small hematoma to the left occipital area of the head.  Also has pain to  palpation along the greater trochanteric area of the left hip.  Given the fall and head strike, did obtain CT head as well as CT cervical spine.  Unremarkable.  No acute fracture or any kind of intracranial bleed was noted   Also obtain x-ray of the left hip.  Question whether or not there could be pubic rami fracture on the hip.  Therefore, did obtain CT pelvis without contrast for better evaluation.  This is unremarkable.  No acute fracture was seen.  Patient able ambulate with her cane.  She usually uses a walker.  Safe for discharge home.  No concerns any kind of cardiogenic syncope.  Patient is alert and oriented x 3 at baseline.  Daughter.  No concerns any kind of large electrolyte derangements.  No concerns for UTI.  No further workup needed.      Final diagnoses:  Fall, initial encounter  Pain of left hip  Contusion of scalp, initial encounter    ED Discharge Orders     None          Simon Lavonia SAILOR, MD 10/27/23 [phone]

## 2023-10-27 NOTE — Discharge Instructions (Addendum)
  1. Fall, initial encounter (Primary) - ED EKG completed in UC shows normal sinus rhythm, ventricular rate of 80 bpm, no STEMI, abnormal EKG.  2. Closed head injury, initial encounter 3. Acute hip pain, left - Based on events with fall with acute closed head injury and left hip pain further evaluation in emergency department with advanced imaging and stat laboratory testing to evaluate potential causes is recommended. - Advanced imaging and stat laboratory testing is unavailable in urgent care and essential for proper evaluation of potential causes. - Please go to the emergency department for further evaluation potential causes of fall with stat laboratory testing, possible CT of the head for evaluation of closed head injury and possible need for CT/x-ray left hip to evaluate for left hip pain.

## 2023-10-27 NOTE — ED Notes (Signed)
 Pt given discharge instructions. Opportunities given for questions. Pt verbalizes understanding. Bethena Powell SAUNDERS, RN

## 2023-10-27 NOTE — ED Provider Notes (Signed)
 [facility]  Note:  This document was prepared using Conservation officer, historic buildings and may include unintentional dictation errors.  MRN: [id number] DOB: [birth year]  Subjective:   Kathy Calhoun is a 89 y.o. female presenting for a fall with closed head injury that occurred earlier this afternoon.  Patient reports that she was getting something out of the closet when she fell backwards hitting her head on the wall behind her.  Daughter reports that fall caused a dent in the drywall signifying significant impact.  Patient denies loss of consciousness prior to or as a result of the fall.  Patient is reporting head pain and left hip pain as a result of the fall.  Patient has not taken any over-the-counter pain reliever or other medication to help with symptoms.  Patient has had other recent falls one 1 month ago and recently had some confusion and disorientation.  Patient has history of CVA in the past.  No current facility-administered medications for this encounter.  Current Outpatient Medications:    acetaminophen  (TYLENOL ) 500 MG tablet, Take 1,000 mg by mouth every 6 (six) hours as needed (pain). , Disp: , Rfl:    alendronate (FOSAMAX) 70 MG tablet, Take 70 mg by mouth once a week., Disp: , Rfl:    Calcium  Carb-Cholecalciferol  600-20 MG-MCG CHEW, Chew by mouth., Disp: , Rfl:    amLODipine  (NORVASC ) 5 MG tablet, Take 5 mg by mouth daily., Disp: , Rfl:    aspirin  EC 81 MG tablet, Take 81 mg by mouth daily., Disp: , Rfl:    atorvastatin  (LIPITOR) 40 MG tablet, Take 40 mg by mouth daily., Disp: , Rfl:    Calcium  Carb-Cholecalciferol  (CALCIUM  PLUS VITAMIN D ) 500-5 MG-MCG TABS, 1 tablet with meals Orally Twice a day, Disp: , Rfl:    Calcium -Vitamin D  (CALTRATE 600 PLUS-VIT D PO), Take 1 tablet by mouth once or twice daily, Disp: , Rfl:    diclofenac  sodium (VOLTAREN ) 1 % GEL, Apply 2 g topically 4 (four) times daily as needed (pain)., Disp: , Rfl:    DULoxetine  (CYMBALTA ) 30 MG  capsule, Take 30 mg by mouth daily., Disp: , Rfl:    famotidine (PEPCID) 40 MG tablet, Take 40 mg by mouth daily., Disp: , Rfl:    lisinopril  (PRINIVIL ,ZESTRIL ) 5 MG tablet, Take 5 mg by mouth daily. , Disp: , Rfl:    Multiple Vitamin (MULTI VITAMIN) TABS, 1 tablet Orally Once a day, Disp: , Rfl:    Multiple Vitamins-Minerals (MULTIVITAMIN WITH MINERALS) tablet, Take 1 tablet by mouth daily., Disp: , Rfl:    Allergies  Allergen Reactions   Codeine Rash and Dermatitis    Other Reaction(s): other  codeine   Sulfa Antibiotics Rash and Dermatitis    Past Medical History:  Diagnosis Date   Disc    bulging lumbar disc has had steriod injections  x3   HTN (hypertension)    Osteoporosis    Stroke [facility])      Past Surgical History:  Procedure Laterality Date   APPENDECTOMY     CATARACT EXTRACTION     bilaterally 2011   HYSTEROTOMY     LAPAROTOMY     large benign mass removed 1993   MOUTH SURGERY     NASAL SEPTUM SURGERY     OOPHORECTOMY     TUBAL LIGATION      Family History  Problem Relation Age of Onset   Cancer Father    Dementia Brother    Dementia Sister  Social History   Tobacco Use   Smoking status: Never   Smokeless tobacco: Never  Vaping Use   Vaping status: Never Used  Substance Use Topics   Alcohol use: No   Drug use: No    ROS Refer to HPI for ROS details.  Objective:   Vitals: BP (!) 159/77 (BP Location: Left Arm)   Pulse 81   Temp 98.1 F (36.7 C) (Oral)   Resp 18   Ht 4' 11 (1.499 m)   Wt 130 lb 1.1 oz (59 kg)   LMP  (LMP Unknown)   SpO2 95%   BMI 26.27 kg/m   Physical Exam Vitals and nursing note reviewed.  Constitutional:      General: She is not in acute distress.    Appearance: Normal appearance. She is well-developed. She is not ill-appearing or toxic-appearing.  HENT:     Head: Normocephalic. Contusion (Small goose egg noted to posterior skull, minimal bruising, mild swelling, pain with palpation.) present. No raccoon  eyes, Battle's sign or abrasion.   Eyes:     General:        Right eye: No discharge.        Left eye: No discharge.     Extraocular Movements: Extraocular movements intact.     Conjunctiva/sclera: Conjunctivae normal.     Pupils: Pupils are equal, round, and reactive to light.  Cardiovascular:     Rate and Rhythm: Normal rate.  Pulmonary:     Effort: Pulmonary effort is normal. No respiratory distress.  Musculoskeletal:        General: Normal range of motion.  Skin:    General: Skin is warm and dry.  Neurological:     General: No focal deficit present.     Mental Status: She is alert and oriented to person, place, and time.  Psychiatric:        Mood and Affect: Mood normal.        Behavior: Behavior normal.     Procedures  No results found for this or any previous visit (from the past 24 hours).  No results found.   Assessment and Plan :     Discharge Instructions       1. Fall, initial encounter (Primary) - ED EKG completed in UC shows normal sinus rhythm, ventricular rate of 80 bpm, no STEMI, abnormal EKG.  2. Closed head injury, initial encounter 3. Acute hip pain, left - Based on events with fall with acute closed head injury and left hip pain further evaluation in emergency department with advanced imaging and stat laboratory testing to evaluate potential causes is recommended. - Advanced imaging and stat laboratory testing is unavailable in urgent care and essential for proper evaluation of potential causes. - Please go to the emergency department for further evaluation potential causes of fall with stat laboratory testing, possible CT of the head for evaluation of closed head injury and possible need for CT/x-ray left hip to evaluate for left hip pain.       Corleone Biegler B Noura Purpura   [place], [place], [place] 10/27/23 1549

## 2023-10-28 DIAGNOSIS — Z9181 History of falling: Secondary | ICD-10-CM | POA: Diagnosis not present

## 2023-10-28 DIAGNOSIS — I119 Hypertensive heart disease without heart failure: Secondary | ICD-10-CM | POA: Diagnosis not present

## 2023-10-28 DIAGNOSIS — M810 Age-related osteoporosis without current pathological fracture: Secondary | ICD-10-CM | POA: Diagnosis not present

## 2023-10-28 DIAGNOSIS — Z7982 Long term (current) use of aspirin: Secondary | ICD-10-CM | POA: Diagnosis not present

## 2023-10-28 DIAGNOSIS — Z8616 Personal history of COVID-19: Secondary | ICD-10-CM | POA: Diagnosis not present

## 2023-10-28 DIAGNOSIS — K449 Diaphragmatic hernia without obstruction or gangrene: Secondary | ICD-10-CM | POA: Diagnosis not present

## 2023-10-28 DIAGNOSIS — M4807 Spinal stenosis, lumbosacral region: Secondary | ICD-10-CM | POA: Diagnosis not present

## 2023-10-28 DIAGNOSIS — Z8673 Personal history of transient ischemic attack (TIA), and cerebral infarction without residual deficits: Secondary | ICD-10-CM | POA: Diagnosis not present

## 2023-10-28 DIAGNOSIS — E7800 Pure hypercholesterolemia, unspecified: Secondary | ICD-10-CM | POA: Diagnosis not present

## 2023-10-28 DIAGNOSIS — M1990 Unspecified osteoarthritis, unspecified site: Secondary | ICD-10-CM | POA: Diagnosis not present

## 2023-10-28 DIAGNOSIS — Z86711 Personal history of pulmonary embolism: Secondary | ICD-10-CM | POA: Diagnosis not present

## 2023-10-28 DIAGNOSIS — R1310 Dysphagia, unspecified: Secondary | ICD-10-CM | POA: Diagnosis not present

## 2023-10-28 DIAGNOSIS — K224 Dyskinesia of esophagus: Secondary | ICD-10-CM | POA: Diagnosis not present

## 2023-10-31 DIAGNOSIS — E7800 Pure hypercholesterolemia, unspecified: Secondary | ICD-10-CM | POA: Diagnosis not present

## 2023-10-31 DIAGNOSIS — R1310 Dysphagia, unspecified: Secondary | ICD-10-CM | POA: Diagnosis not present

## 2023-10-31 DIAGNOSIS — I119 Hypertensive heart disease without heart failure: Secondary | ICD-10-CM | POA: Diagnosis not present

## 2023-10-31 DIAGNOSIS — M1990 Unspecified osteoarthritis, unspecified site: Secondary | ICD-10-CM | POA: Diagnosis not present

## 2023-10-31 DIAGNOSIS — M810 Age-related osteoporosis without current pathological fracture: Secondary | ICD-10-CM | POA: Diagnosis not present

## 2023-10-31 DIAGNOSIS — K224 Dyskinesia of esophagus: Secondary | ICD-10-CM | POA: Diagnosis not present

## 2023-11-07 DIAGNOSIS — M1990 Unspecified osteoarthritis, unspecified site: Secondary | ICD-10-CM | POA: Diagnosis not present

## 2023-11-07 DIAGNOSIS — R1310 Dysphagia, unspecified: Secondary | ICD-10-CM | POA: Diagnosis not present

## 2023-11-07 DIAGNOSIS — E7800 Pure hypercholesterolemia, unspecified: Secondary | ICD-10-CM | POA: Diagnosis not present

## 2023-11-07 DIAGNOSIS — K224 Dyskinesia of esophagus: Secondary | ICD-10-CM | POA: Diagnosis not present

## 2023-11-07 DIAGNOSIS — M810 Age-related osteoporosis without current pathological fracture: Secondary | ICD-10-CM | POA: Diagnosis not present

## 2023-11-07 DIAGNOSIS — I119 Hypertensive heart disease without heart failure: Secondary | ICD-10-CM | POA: Diagnosis not present

## 2023-11-08 DIAGNOSIS — R479 Unspecified speech disturbances: Secondary | ICD-10-CM | POA: Diagnosis not present

## 2023-11-08 DIAGNOSIS — S0093XD Contusion of unspecified part of head, subsequent encounter: Secondary | ICD-10-CM | POA: Diagnosis not present

## 2023-11-08 DIAGNOSIS — Z9181 History of falling: Secondary | ICD-10-CM | POA: Diagnosis not present

## 2023-11-11 DIAGNOSIS — M810 Age-related osteoporosis without current pathological fracture: Secondary | ICD-10-CM | POA: Diagnosis not present

## 2023-11-11 DIAGNOSIS — I10 Essential (primary) hypertension: Secondary | ICD-10-CM | POA: Diagnosis not present

## 2023-11-11 DIAGNOSIS — E7800 Pure hypercholesterolemia, unspecified: Secondary | ICD-10-CM | POA: Diagnosis not present

## 2023-11-13 DIAGNOSIS — I119 Hypertensive heart disease without heart failure: Secondary | ICD-10-CM | POA: Diagnosis not present

## 2023-11-13 DIAGNOSIS — K224 Dyskinesia of esophagus: Secondary | ICD-10-CM | POA: Diagnosis not present

## 2023-11-13 DIAGNOSIS — M1990 Unspecified osteoarthritis, unspecified site: Secondary | ICD-10-CM | POA: Diagnosis not present

## 2023-11-13 DIAGNOSIS — R1310 Dysphagia, unspecified: Secondary | ICD-10-CM | POA: Diagnosis not present

## 2023-11-13 DIAGNOSIS — E7800 Pure hypercholesterolemia, unspecified: Secondary | ICD-10-CM | POA: Diagnosis not present

## 2023-11-13 DIAGNOSIS — M810 Age-related osteoporosis without current pathological fracture: Secondary | ICD-10-CM | POA: Diagnosis not present

## 2023-11-21 DIAGNOSIS — M810 Age-related osteoporosis without current pathological fracture: Secondary | ICD-10-CM | POA: Diagnosis not present

## 2023-11-21 DIAGNOSIS — R1310 Dysphagia, unspecified: Secondary | ICD-10-CM | POA: Diagnosis not present

## 2023-11-21 DIAGNOSIS — M1990 Unspecified osteoarthritis, unspecified site: Secondary | ICD-10-CM | POA: Diagnosis not present

## 2023-11-21 DIAGNOSIS — I119 Hypertensive heart disease without heart failure: Secondary | ICD-10-CM | POA: Diagnosis not present

## 2023-11-21 DIAGNOSIS — K224 Dyskinesia of esophagus: Secondary | ICD-10-CM | POA: Diagnosis not present

## 2023-11-21 DIAGNOSIS — E7800 Pure hypercholesterolemia, unspecified: Secondary | ICD-10-CM | POA: Diagnosis not present

## 2023-11-27 DIAGNOSIS — Z7982 Long term (current) use of aspirin: Secondary | ICD-10-CM | POA: Diagnosis not present

## 2023-11-27 DIAGNOSIS — K449 Diaphragmatic hernia without obstruction or gangrene: Secondary | ICD-10-CM | POA: Diagnosis not present

## 2023-11-27 DIAGNOSIS — E7800 Pure hypercholesterolemia, unspecified: Secondary | ICD-10-CM | POA: Diagnosis not present

## 2023-11-27 DIAGNOSIS — K224 Dyskinesia of esophagus: Secondary | ICD-10-CM | POA: Diagnosis not present

## 2023-11-27 DIAGNOSIS — I119 Hypertensive heart disease without heart failure: Secondary | ICD-10-CM | POA: Diagnosis not present

## 2023-11-27 DIAGNOSIS — Z8673 Personal history of transient ischemic attack (TIA), and cerebral infarction without residual deficits: Secondary | ICD-10-CM | POA: Diagnosis not present

## 2023-11-27 DIAGNOSIS — Z9181 History of falling: Secondary | ICD-10-CM | POA: Diagnosis not present

## 2023-11-27 DIAGNOSIS — M4807 Spinal stenosis, lumbosacral region: Secondary | ICD-10-CM | POA: Diagnosis not present

## 2023-11-27 DIAGNOSIS — R1310 Dysphagia, unspecified: Secondary | ICD-10-CM | POA: Diagnosis not present

## 2023-11-27 DIAGNOSIS — Z8616 Personal history of COVID-19: Secondary | ICD-10-CM | POA: Diagnosis not present

## 2023-11-27 DIAGNOSIS — M1990 Unspecified osteoarthritis, unspecified site: Secondary | ICD-10-CM | POA: Diagnosis not present

## 2023-11-27 DIAGNOSIS — M810 Age-related osteoporosis without current pathological fracture: Secondary | ICD-10-CM | POA: Diagnosis not present

## 2023-11-27 DIAGNOSIS — Z86711 Personal history of pulmonary embolism: Secondary | ICD-10-CM | POA: Diagnosis not present

## 2023-12-03 DIAGNOSIS — Z8673 Personal history of transient ischemic attack (TIA), and cerebral infarction without residual deficits: Secondary | ICD-10-CM | POA: Diagnosis not present

## 2023-12-03 DIAGNOSIS — E7800 Pure hypercholesterolemia, unspecified: Secondary | ICD-10-CM | POA: Diagnosis not present

## 2023-12-03 DIAGNOSIS — Z23 Encounter for immunization: Secondary | ICD-10-CM | POA: Diagnosis not present

## 2023-12-03 DIAGNOSIS — Z79899 Other long term (current) drug therapy: Secondary | ICD-10-CM | POA: Diagnosis not present

## 2023-12-03 DIAGNOSIS — M810 Age-related osteoporosis without current pathological fracture: Secondary | ICD-10-CM | POA: Diagnosis not present

## 2023-12-03 DIAGNOSIS — R269 Unspecified abnormalities of gait and mobility: Secondary | ICD-10-CM | POA: Diagnosis not present

## 2023-12-03 DIAGNOSIS — I10 Essential (primary) hypertension: Secondary | ICD-10-CM | POA: Diagnosis not present

## 2023-12-03 DIAGNOSIS — Z0000 Encounter for general adult medical examination without abnormal findings: Secondary | ICD-10-CM | POA: Diagnosis not present

## 2023-12-03 DIAGNOSIS — F3342 Major depressive disorder, recurrent, in full remission: Secondary | ICD-10-CM | POA: Diagnosis not present

## 2023-12-03 DIAGNOSIS — Z1331 Encounter for screening for depression: Secondary | ICD-10-CM | POA: Diagnosis not present

## 2023-12-03 DIAGNOSIS — R1312 Dysphagia, oropharyngeal phase: Secondary | ICD-10-CM | POA: Diagnosis not present

## 2023-12-05 DIAGNOSIS — E7800 Pure hypercholesterolemia, unspecified: Secondary | ICD-10-CM | POA: Diagnosis not present

## 2023-12-05 DIAGNOSIS — R1310 Dysphagia, unspecified: Secondary | ICD-10-CM | POA: Diagnosis not present

## 2023-12-05 DIAGNOSIS — I119 Hypertensive heart disease without heart failure: Secondary | ICD-10-CM | POA: Diagnosis not present

## 2023-12-05 DIAGNOSIS — M810 Age-related osteoporosis without current pathological fracture: Secondary | ICD-10-CM | POA: Diagnosis not present

## 2023-12-05 DIAGNOSIS — M1990 Unspecified osteoarthritis, unspecified site: Secondary | ICD-10-CM | POA: Diagnosis not present

## 2023-12-05 DIAGNOSIS — K224 Dyskinesia of esophagus: Secondary | ICD-10-CM | POA: Diagnosis not present

## 2023-12-10 DIAGNOSIS — Z23 Encounter for immunization: Secondary | ICD-10-CM | POA: Diagnosis not present

## 2023-12-11 DIAGNOSIS — M810 Age-related osteoporosis without current pathological fracture: Secondary | ICD-10-CM | POA: Diagnosis not present

## 2023-12-11 DIAGNOSIS — M1990 Unspecified osteoarthritis, unspecified site: Secondary | ICD-10-CM | POA: Diagnosis not present

## 2023-12-11 DIAGNOSIS — E7800 Pure hypercholesterolemia, unspecified: Secondary | ICD-10-CM | POA: Diagnosis not present

## 2023-12-11 DIAGNOSIS — R1310 Dysphagia, unspecified: Secondary | ICD-10-CM | POA: Diagnosis not present

## 2023-12-11 DIAGNOSIS — I119 Hypertensive heart disease without heart failure: Secondary | ICD-10-CM | POA: Diagnosis not present

## 2023-12-11 DIAGNOSIS — K224 Dyskinesia of esophagus: Secondary | ICD-10-CM | POA: Diagnosis not present

## 2023-12-11 DIAGNOSIS — I10 Essential (primary) hypertension: Secondary | ICD-10-CM | POA: Diagnosis not present

## 2024-01-11 DIAGNOSIS — I10 Essential (primary) hypertension: Secondary | ICD-10-CM | POA: Diagnosis not present

## 2024-01-11 DIAGNOSIS — E7800 Pure hypercholesterolemia, unspecified: Secondary | ICD-10-CM | POA: Diagnosis not present

## 2024-01-11 DIAGNOSIS — M810 Age-related osteoporosis without current pathological fracture: Secondary | ICD-10-CM | POA: Diagnosis not present

## 2024-02-10 DIAGNOSIS — E7800 Pure hypercholesterolemia, unspecified: Secondary | ICD-10-CM | POA: Diagnosis not present

## 2024-02-10 DIAGNOSIS — I10 Essential (primary) hypertension: Secondary | ICD-10-CM | POA: Diagnosis not present

## 2024-02-10 DIAGNOSIS — M810 Age-related osteoporosis without current pathological fracture: Secondary | ICD-10-CM | POA: Diagnosis not present
